# Patient Record
Sex: Female | Born: 1980 | Race: White | Hispanic: No | Marital: Single | Smoking: Current some day smoker
Health system: Southern US, Community
[De-identification: ages and names within clinical notes are randomized; demographics above are authoritative.]

## PROBLEM LIST (undated history)

## (undated) DIAGNOSIS — F32A Depression, unspecified: Secondary | ICD-10-CM

## (undated) DIAGNOSIS — J40 Bronchitis, not specified as acute or chronic: Secondary | ICD-10-CM

## (undated) DIAGNOSIS — F419 Anxiety disorder, unspecified: Secondary | ICD-10-CM

## (undated) DIAGNOSIS — F99 Mental disorder, not otherwise specified: Secondary | ICD-10-CM

## (undated) DIAGNOSIS — F319 Bipolar disorder, unspecified: Secondary | ICD-10-CM

## (undated) DIAGNOSIS — K219 Gastro-esophageal reflux disease without esophagitis: Secondary | ICD-10-CM

## (undated) DIAGNOSIS — M419 Scoliosis, unspecified: Secondary | ICD-10-CM

## (undated) DIAGNOSIS — E785 Hyperlipidemia, unspecified: Secondary | ICD-10-CM

## (undated) DIAGNOSIS — J45909 Unspecified asthma, uncomplicated: Secondary | ICD-10-CM

## (undated) HISTORY — DX: Bipolar disorder, unspecified: F31.9

## (undated) HISTORY — DX: Anxiety disorder, unspecified: F41.9

## (undated) HISTORY — DX: Mental disorder, not otherwise specified: F99

## (undated) HISTORY — PX: FACIAL FRACTURE SURGERY: SHX1570

## (undated) HISTORY — PX: NO PAST SURGERIES: SHX2092

## (undated) HISTORY — DX: Hyperlipidemia, unspecified: E78.5

## (undated) HISTORY — DX: Depression, unspecified: F32.A

---
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
## 1999-03-29 ENCOUNTER — Emergency Department (HOSPITAL_COMMUNITY): Admission: EM | Admit: 1999-03-29 | Discharge: 1999-03-29 | Payer: Self-pay | Admitting: Emergency Medicine

## 1999-03-29 ENCOUNTER — Encounter: Payer: Self-pay | Admitting: Emergency Medicine

## 2008-05-29 ENCOUNTER — Inpatient Hospital Stay (HOSPITAL_COMMUNITY): Admission: AD | Admit: 2008-05-29 | Discharge: 2008-05-29 | Payer: Self-pay | Admitting: Obstetrics

## 2008-06-29 ENCOUNTER — Ambulatory Visit (HOSPITAL_COMMUNITY): Admission: RE | Admit: 2008-06-29 | Discharge: 2008-06-29 | Payer: Self-pay | Admitting: Obstetrics

## 2008-08-23 ENCOUNTER — Inpatient Hospital Stay (HOSPITAL_COMMUNITY): Admission: AD | Admit: 2008-08-23 | Discharge: 2008-08-23 | Payer: Self-pay | Admitting: Obstetrics

## 2008-08-24 ENCOUNTER — Inpatient Hospital Stay (HOSPITAL_COMMUNITY): Admission: AD | Admit: 2008-08-24 | Discharge: 2008-08-24 | Payer: Self-pay | Admitting: Obstetrics

## 2008-08-24 ENCOUNTER — Inpatient Hospital Stay (HOSPITAL_COMMUNITY): Admission: AD | Admit: 2008-08-24 | Discharge: 2008-08-26 | Payer: Self-pay | Admitting: Obstetrics & Gynecology

## 2008-11-08 ENCOUNTER — Emergency Department (HOSPITAL_COMMUNITY): Admission: EM | Admit: 2008-11-08 | Discharge: 2008-11-08 | Payer: Self-pay | Admitting: Emergency Medicine

## 2009-12-10 ENCOUNTER — Ambulatory Visit (HOSPITAL_COMMUNITY): Admission: RE | Admit: 2009-12-10 | Discharge: 2009-12-10 | Payer: Self-pay | Admitting: Internal Medicine

## 2010-07-19 IMAGING — CR DG ABDOMEN ACUTE W/ 1V CHEST
3 series · 3 of 3 positions shown · non-contrast
Comparison: None

CLINICAL DATA: Nausea, vomiting, diarrhea, fever and cramping.

ACUTE ABDOMEN SERIES (ABDOMEN 2 VIEW & CHEST 1 VIEW)

[w chest pa]
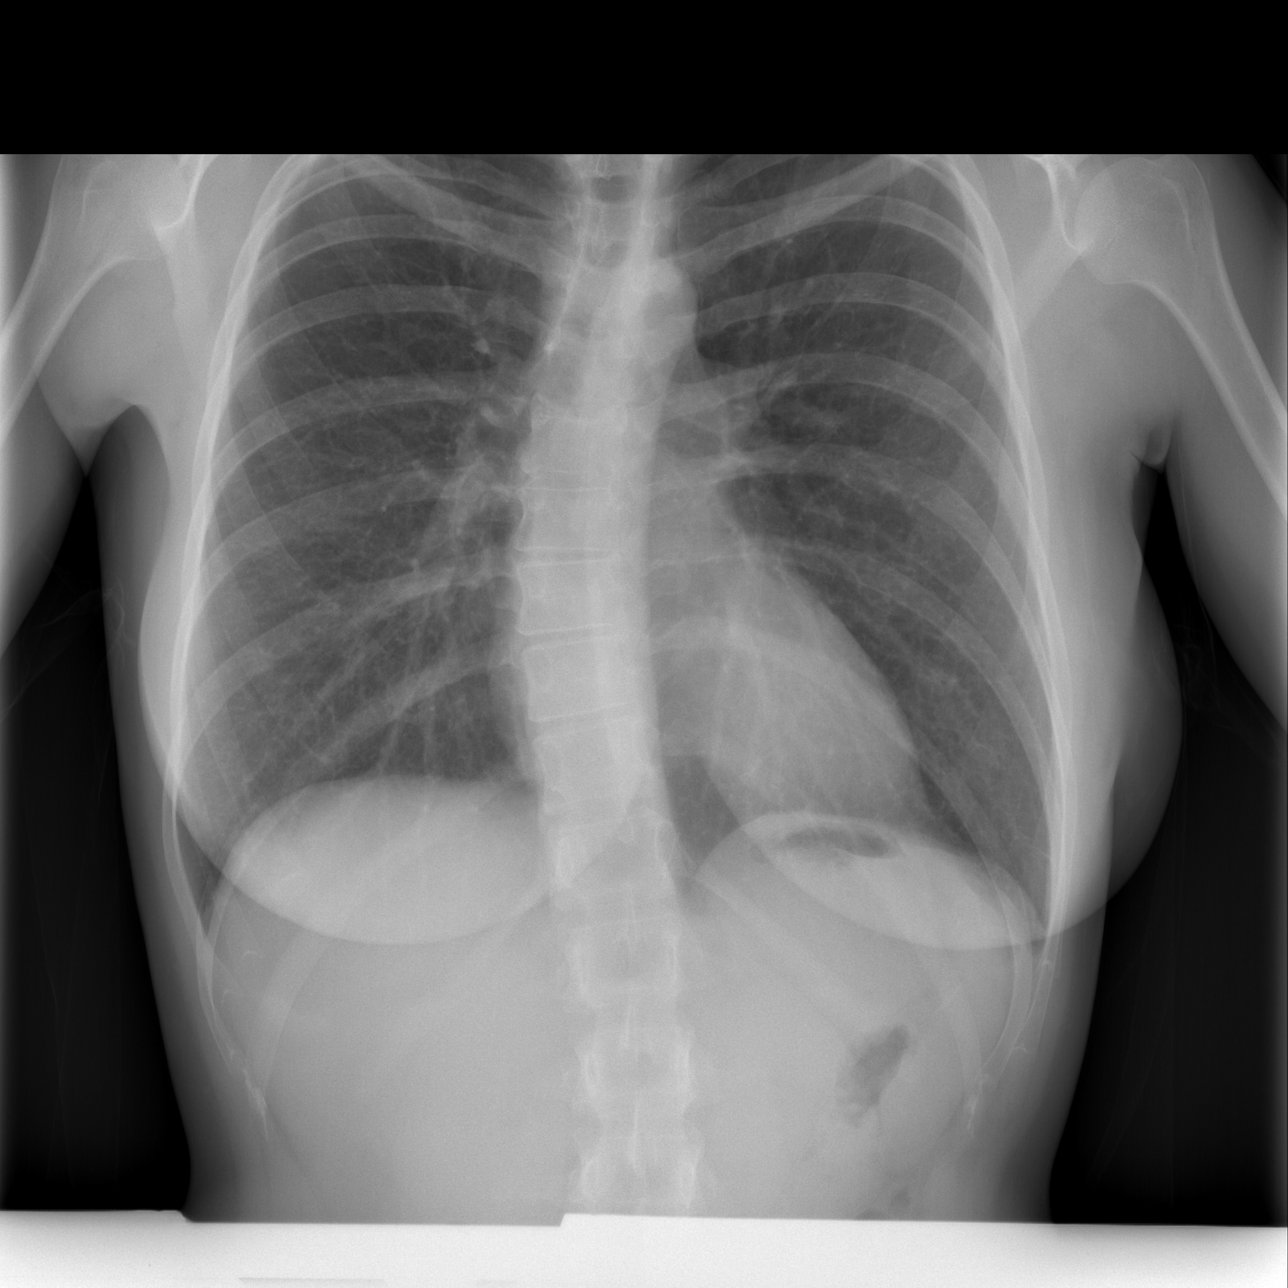

[w abdomen upright *]
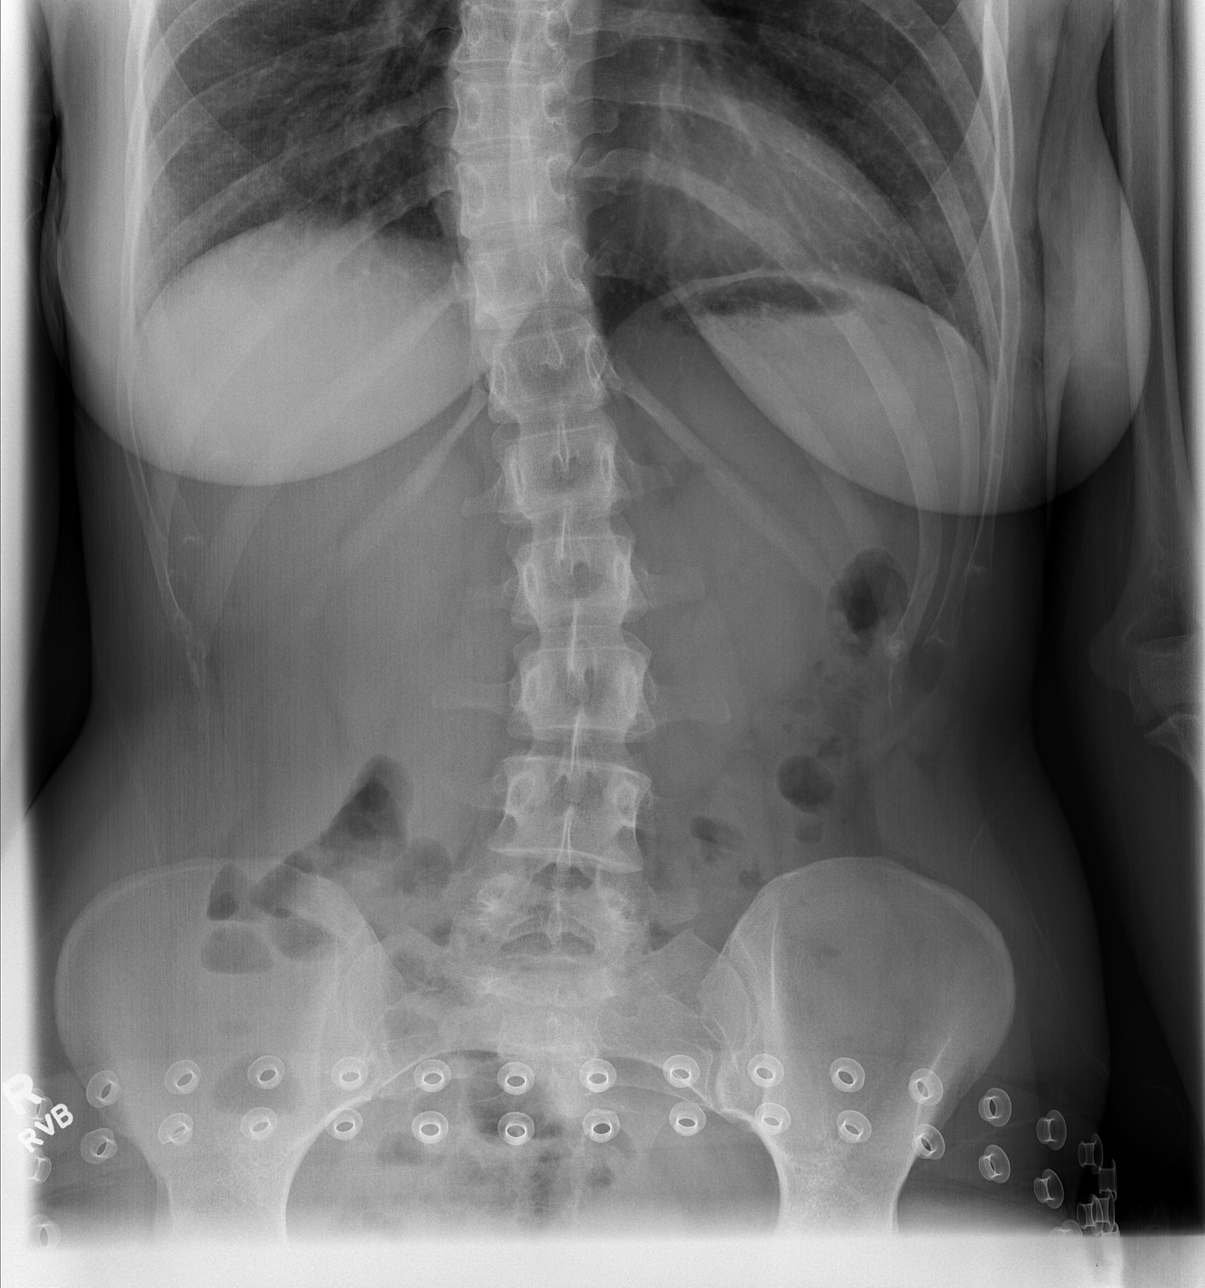

[t abdomen supine]
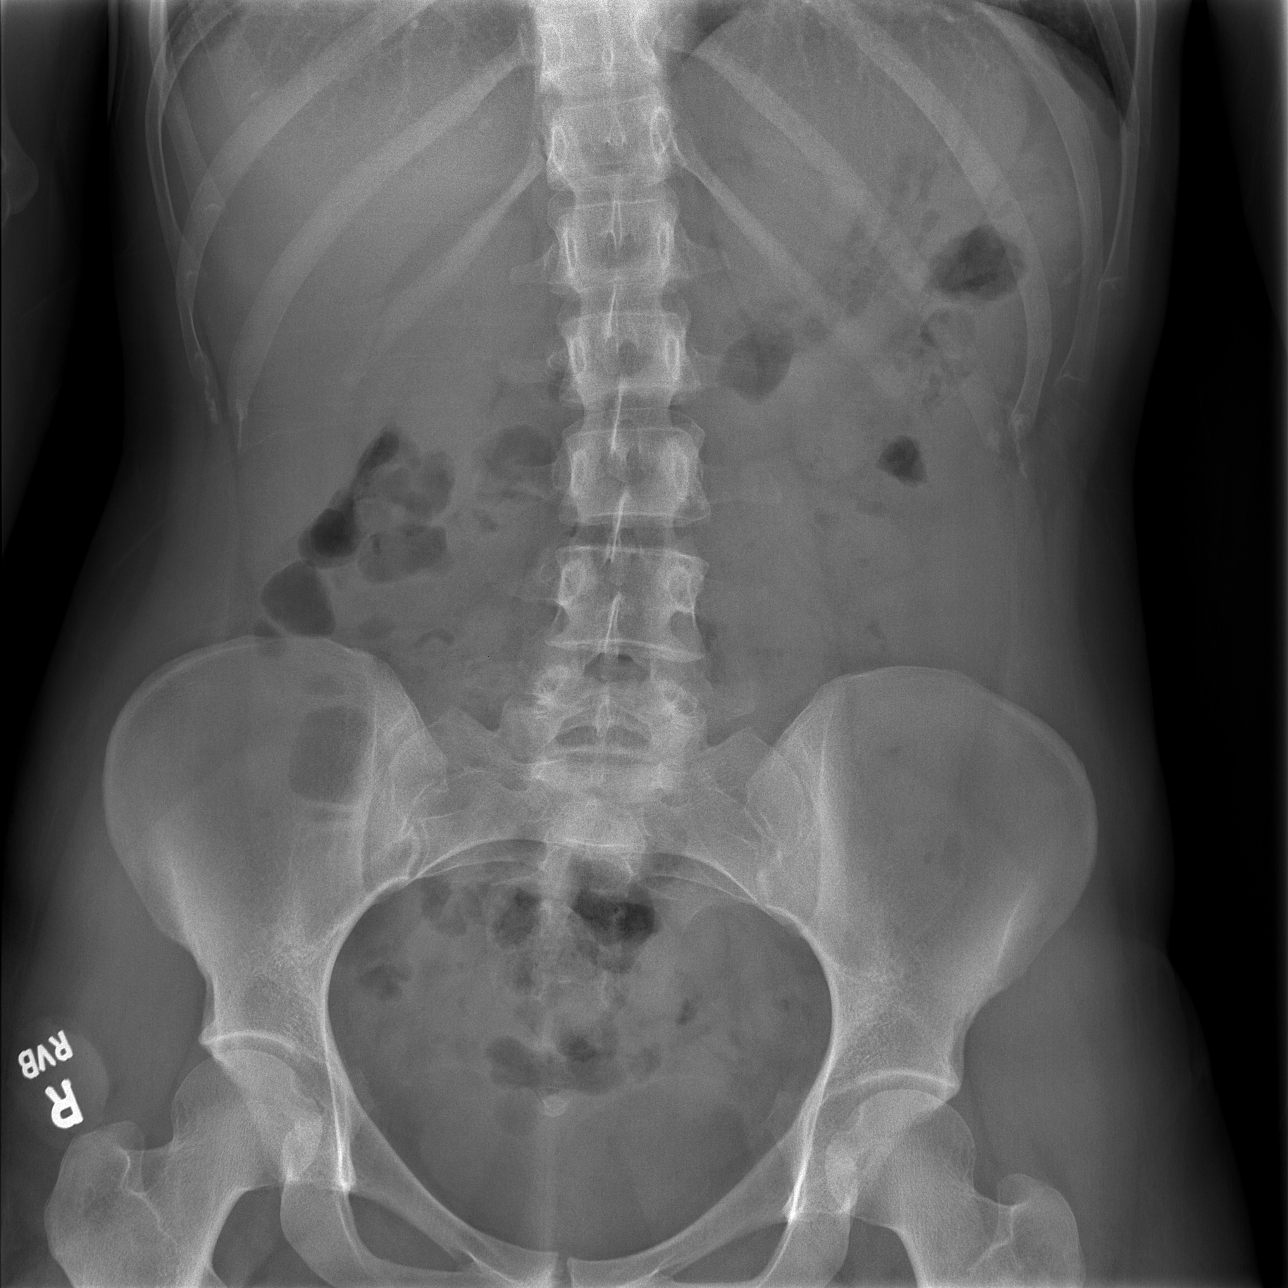

[3 of 3 positions shown; findings below may reference images not displayed]

FINDINGS: Frontal examination of the chest demonstrates a
thoracolumbar scoliosis.  The heart size and mediastinal contours
are normal.  The lungs are clear.  There is no pleural effusion or
pneumothorax.

Supine and erect views of the abdomen demonstrate a normal bowel
gas pattern.  There is no free intraperitoneal air or suspicious
abdominal calcification.  No acute osseous findings are
demonstrated.
IMPRESSION: No active cardiopulmonary or abdominal process.  Thoracolumbar
scoliosis.

## 2010-07-31 LAB — DIFFERENTIAL
Eosinophils Absolute: 0 10*3/uL (ref 0.0–0.7)
Eosinophils Relative: 0 % (ref 0–5)
Lymphs Abs: 1.3 10*3/uL (ref 0.7–4.0)
Monocytes Absolute: 0.4 10*3/uL (ref 0.1–1.0)

## 2010-07-31 LAB — URINALYSIS, ROUTINE W REFLEX MICROSCOPIC
Glucose, UA: NEGATIVE mg/dL
Hgb urine dipstick: NEGATIVE
Specific Gravity, Urine: >1.046 — ABNORMAL HIGH (ref 1.005–1.030)
pH: 6 (ref 5.0–8.0)

## 2010-07-31 LAB — COMPREHENSIVE METABOLIC PANEL
ALT: 22 U/L (ref 0–35)
AST: 35 U/L (ref 0–37)
CO2: 25 mEq/L (ref 19–32)
Calcium: 9.2 mg/dL (ref 8.4–10.5)
Chloride: 105 mEq/L (ref 96–112)
GFR calc Af Amer: >60 mL/min (ref >60)
GFR calc non Af Amer: >60 mL/min (ref >60)
Sodium: 138 mEq/L (ref 135–145)

## 2010-07-31 LAB — URINE MICROSCOPIC-ADD ON

## 2010-07-31 LAB — CBC
MCHC: 34.6 g/dL (ref 30.0–36.0)
RBC: 4.99 MIL/uL (ref 3.87–5.11)
WBC: 5.3 10*3/uL (ref 4.0–10.5)

## 2010-07-31 LAB — LIPASE, BLOOD: Lipase: 29 U/L (ref 11–59)

## 2010-08-02 LAB — CBC
HCT: 39 % (ref 36.0–46.0)
HCT: 41.9 % (ref 36.0–46.0)
MCHC: 35.3 g/dL (ref 30.0–36.0)
MCHC: 35.3 g/dL (ref 30.0–36.0)
MCV: 91 fL (ref 78.0–100.0)
MCV: 93.4 fL (ref 78.0–100.0)
Platelets: 161 10*3/uL (ref 150–400)
Platelets: 178 10*3/uL (ref 150–400)
RDW: 12.9 % (ref 11.5–15.5)
RDW: 12.9 % (ref 11.5–15.5)

## 2010-08-09 LAB — ABO/RH: ABO/RH(D): O POS

## 2010-09-06 NOTE — H&P (Signed)
NAME:  Katie Pope, Katie Pope            ACCOUNT NO.:  [id number]   MEDICAL RECORD NO.:  [id number]          PATIENT TYPE:  INP   LOCATION:  9105                          FACILITY:  [facility]   PHYSICIAN:  Roseanna Rainbow, M.D.DATE OF BIRTH:  November 26, 1980   DATE OF ADMISSION:  08/24/2008  DATE OF DISCHARGE:                              HISTORY & PHYSICAL   CHIEF COMPLAINT:  The patient is a 30 year old, para 0, with an  estimated date of confinement of Sep 02, 2008, with an intrauterine  pregnancy at 38 plus weeks complaining of rupture of membranes and  uterine contractions.   HISTORY OF PRESENT ILLNESS:  Please see the above.   ALLERGIES:  CEPHALOSPORINS.   MEDICATIONS:  Please see the medication reconciliation form.   PRENATAL LABS:  Urine culture and sensitivity, no growth.  GC probe  negative.  1-hour GCT 123, GBS negative on July 28, 2008.  Hemoglobin  11.9, hematocrit 35, HIV nonreactive, platelets 190,000.  Blood type is  O+, antibody screen negative, RPR nonreactive.  An ultrasound on February 07, 2008, 9 weeks 5-day a viable pregnancy.  An ultrasound on June 29, 2008, at 30 weeks 0 days at the 50th percentile, no previa, normal  amniotic fluid index.   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  The Pap smear was negative.  Tobacco use.   PAST GYN HISTORY:  Please see the above.   PAST MEDICAL HISTORY:  Asthma and kidney stones.   PAST SURGICAL HISTORY:  Oral surgery.   SOCIAL HISTORY:  She is unemployed.  Does not give any significant  history of alcohol usage.  Current tobacco use, has smoked for 5-10  years.   ILLICIT DRUG USE:  Please see the above.   FAMILY HISTORY:  Remarkable for adult-onset diabetes.   PHYSICAL EXAMINATION:  VITAL SIGNS:  Stable and afebrile.  Fetal heart  tracing reassuring.  Tocodynamometer, uterine contractions every 2  minutes.  GENERAL:  Uncomfortable.  ABDOMEN:  Gravid.  Sterile  vaginal exam per the RN.  There is a bloody  show admixed with amniotic fluid.  The cervix is 5 cm dilated.   ASSESSMENT:  Primigravida at term, late latent versus early active  labor.  Fetal heart tracing consistent with fetal well being.   PLAN:  Admission, expectant management.  Anticipate a spontaneous  vaginal delivery.      Roseanna Rainbow, M.D.  Electronically Signed     LAJ/MEDQ  D:  08/24/2008  T:  08/25/2008  Job:  [id number]

## 2011-07-27 ENCOUNTER — Encounter (HOSPITAL_COMMUNITY): Payer: Self-pay

## 2011-07-27 ENCOUNTER — Emergency Department (INDEPENDENT_AMBULATORY_CARE_PROVIDER_SITE_OTHER)
Admission: EM | Admit: 2011-07-27 | Discharge: 2011-07-27 | Disposition: A | Discharge status: Home / Self Care | Payer: Self-pay | Source: Home / Self Care | Attending: Family Medicine | Admitting: Family Medicine

## 2011-07-27 DIAGNOSIS — N390 Urinary tract infection, site not specified: Secondary | ICD-10-CM

## 2011-07-27 LAB — POCT URINALYSIS DIP (DEVICE)
Nitrite: NEGATIVE
Protein, ur: NEGATIVE mg/dL
pH: 6.5 (ref 5.0–8.0)

## 2011-07-27 MED ORDER — PHENAZOPYRIDINE HCL 200 MG PO TABS: 200.0000 mg | ORAL_TABLET | Freq: Three times a day (TID) | ORAL | Status: AC

## 2011-07-27 MED ORDER — CIPROFLOXACIN HCL 500 MG PO TABS: 500.0000 mg | ORAL_TABLET | Freq: Two times a day (BID) | ORAL | Status: AC

## 2011-07-27 NOTE — ED Notes (Signed)
Pt c/o dysuria and increased frequency onset Sunday.  Pt states she has HX of same feels like UTI to her.

## 2011-07-27 NOTE — ED Provider Notes (Addendum)
History     CSN: [id number]  Arrival date & time 07/27/11  1036   First MD Initiated Contact with Patient 07/27/11 1140      Chief Complaint  Patient presents with  . Dysuria    (Consider location/radiation/quality/duration/timing/severity/associated sxs/prior treatment) HPI Comments: Onset Sunday with dysuria and frequency. No fever, has had some chills. She reports some low back discomfort. No n/v. Has increased her water intake and drinking cranberry juice. Has a hx of uti's  The history is provided by the patient.    History reviewed. No pertinent past medical history.  History reviewed. No pertinent past surgical history.  History reviewed. No pertinent family history.  History  Substance Use Topics  . Smoking status: Current Everyday Smoker  . Smokeless tobacco: Not on file  . Alcohol Use: Yes    OB History    Grav Para Term Preterm Abortions TAB SAB Ect Mult Living                  Review of Systems  Constitutional: Positive for chills. Negative for fever.  HENT: Negative.   Respiratory: Negative.   Gastrointestinal: Negative.   Genitourinary: Positive for dysuria, urgency and frequency. Negative for flank pain.  Musculoskeletal: Negative.   Skin: Negative.     Allergies  Rocephin  Home Medications   Current Outpatient Rx  Name Route Sig Dispense Refill  . CIPROFLOXACIN HCL 500 MG PO TABS Oral Take 1 tablet (500 mg total) by mouth 2 (two) times daily. 28 tablet 0  . PHENAZOPYRIDINE HCL 200 MG PO TABS Oral Take 1 tablet (200 mg total) by mouth 3 (three) times daily. 6 tablet 0    BP 116/84  Pulse 68  Temp(Src) 98.4 F (36.9 C) (Oral)  Resp 16  SpO2 100%  LMP 07/15/2011  Physical Exam  Nursing note and vitals reviewed. Constitutional: She appears well-developed and well-nourished. No distress.  Cardiovascular: Normal rate.   Pulmonary/Chest: Effort normal.  Abdominal: Soft. Bowel sounds are normal.  Musculoskeletal:       No flank  tenderness  Skin: Skin is warm and dry.    ED Course  Procedures (including critical care time)  Labs Reviewed  POCT URINALYSIS DIP (DEVICE) - Abnormal; Notable for the following:    Hgb urine dipstick TRACE (*)    Leukocytes, UA TRACE (*) Biochemical Testing Only. Please order routine urinalysis from main lab if confirmatory testing is needed.   All other components within normal limits  POCT PREGNANCY, URINE  URINE CULTURE   No results found.   1. UTI (lower urinary tract infection)       MDM          Randa Spike, MD 07/27/11 [id number]  Randa Spike, MD 07/27/11 1239

## 2011-07-27 NOTE — Discharge Instructions (Signed)
Avoid caffeine and milk products. Follow up with your pcp or return if symptoms persist or worsen.

## 2011-07-30 LAB — URINE CULTURE

## 2011-08-01 NOTE — ED Notes (Signed)
Urine culture: >100,000 colonies Proteus Mirabilis. Pt. adequately treated with Cipro. Katie Pope 08/01/2011

## 2012-04-23 ENCOUNTER — Emergency Department (HOSPITAL_COMMUNITY): Payer: Self-pay

## 2012-04-23 ENCOUNTER — Encounter (HOSPITAL_COMMUNITY): Payer: Self-pay | Admitting: *Deleted

## 2012-04-23 ENCOUNTER — Emergency Department (HOSPITAL_COMMUNITY)
Admission: EM | Admit: 2012-04-23 | Discharge: 2012-04-23 | Disposition: A | Discharge status: Home / Self Care | Payer: Self-pay | Attending: Emergency Medicine | Admitting: Emergency Medicine

## 2012-04-23 DIAGNOSIS — Z8709 Personal history of other diseases of the respiratory system: Secondary | ICD-10-CM | POA: Insufficient documentation

## 2012-04-23 DIAGNOSIS — R1110 Vomiting, unspecified: Secondary | ICD-10-CM | POA: Insufficient documentation

## 2012-04-23 DIAGNOSIS — Z3202 Encounter for pregnancy test, result negative: Secondary | ICD-10-CM | POA: Insufficient documentation

## 2012-04-23 DIAGNOSIS — J069 Acute upper respiratory infection, unspecified: Secondary | ICD-10-CM | POA: Insufficient documentation

## 2012-04-23 DIAGNOSIS — M549 Dorsalgia, unspecified: Secondary | ICD-10-CM | POA: Insufficient documentation

## 2012-04-23 DIAGNOSIS — J45909 Unspecified asthma, uncomplicated: Secondary | ICD-10-CM | POA: Insufficient documentation

## 2012-04-23 DIAGNOSIS — R490 Dysphonia: Secondary | ICD-10-CM | POA: Insufficient documentation

## 2012-04-23 DIAGNOSIS — J029 Acute pharyngitis, unspecified: Secondary | ICD-10-CM | POA: Insufficient documentation

## 2012-04-23 DIAGNOSIS — R5381 Other malaise: Secondary | ICD-10-CM | POA: Insufficient documentation

## 2012-04-23 DIAGNOSIS — R197 Diarrhea, unspecified: Secondary | ICD-10-CM | POA: Insufficient documentation

## 2012-04-23 DIAGNOSIS — M4120 Other idiopathic scoliosis, site unspecified: Secondary | ICD-10-CM | POA: Insufficient documentation

## 2012-04-23 DIAGNOSIS — R079 Chest pain, unspecified: Secondary | ICD-10-CM | POA: Insufficient documentation

## 2012-04-23 DIAGNOSIS — J3489 Other specified disorders of nose and nasal sinuses: Secondary | ICD-10-CM | POA: Insufficient documentation

## 2012-04-23 DIAGNOSIS — Z87891 Personal history of nicotine dependence: Secondary | ICD-10-CM | POA: Insufficient documentation

## 2012-04-23 HISTORY — DX: Bronchitis, not specified as acute or chronic: J40

## 2012-04-23 HISTORY — DX: Unspecified asthma, uncomplicated: J45.909

## 2012-04-23 HISTORY — DX: Scoliosis, unspecified: M41.9

## 2012-04-23 LAB — COMPREHENSIVE METABOLIC PANEL
Albumin: 4.3 g/dL (ref 3.5–5.2)
Alkaline Phosphatase: 55 U/L (ref 39–117)
BUN: 13 mg/dL (ref 6–23)
CO2: 27 mEq/L (ref 19–32)
Chloride: 102 mEq/L (ref 96–112)
GFR calc Af Amer: >90 mL/min (ref >90)
GFR calc non Af Amer: >90 mL/min (ref >90)
Glucose, Bld: 106 mg/dL — ABNORMAL HIGH (ref 70–99)
Potassium: 3.6 mEq/L (ref 3.5–5.1)
Total Bilirubin: 0.4 mg/dL (ref 0.3–1.2)

## 2012-04-23 LAB — URINALYSIS, ROUTINE W REFLEX MICROSCOPIC
Bilirubin Urine: NEGATIVE
Ketones, ur: NEGATIVE mg/dL
Nitrite: NEGATIVE
Protein, ur: NEGATIVE mg/dL
pH: 7.5 (ref 5.0–8.0)

## 2012-04-23 LAB — CBC WITH DIFFERENTIAL/PLATELET
HCT: 44 % (ref 36.0–46.0)
Hemoglobin: 15.4 g/dL — ABNORMAL HIGH (ref 12.0–15.0)
Lymphocytes Relative: 20 % (ref 12–46)
Lymphs Abs: 1.9 10*3/uL (ref 0.7–4.0)
Monocytes Absolute: 0.5 10*3/uL (ref 0.1–1.0)
Monocytes Relative: 6 % (ref 3–12)
Neutro Abs: 6.7 10*3/uL (ref 1.7–7.7)
Neutrophils Relative %: 73 % (ref 43–77)
RBC: 4.79 MIL/uL (ref 3.87–5.11)

## 2012-04-23 LAB — LIPASE, BLOOD: Lipase: 29 U/L (ref 11–59)

## 2012-04-23 MED ORDER — HYDROCODONE-ACETAMINOPHEN 7.5-325 MG/15ML PO SOLN: 15.0000 mL | Freq: Three times a day (TID) | ORAL | Status: DC | PRN

## 2012-04-23 NOTE — ED Notes (Signed)
Pt states 3 day history of sorethroat, dry cough, mid right back pain, vomiting and diarrhea.  Pt denies sob.  Pt sounds hoarse

## 2012-04-23 NOTE — ED Provider Notes (Signed)
History     CSN: [id number]  Arrival date & time 04/23/12  0920   First MD Initiated Contact with Patient 04/23/12 [phone]      Chief Complaint  Patient presents with  . Back Pain  . Cough  . Diarrhea    (Consider location/radiation/quality/duration/timing/severity/associated sxs/prior treatment) Patient is a 31 y.o. female presenting with cough, diarrhea, and flu symptoms. The history is provided by the patient.  Cough Associated symptoms include sore throat. Pertinent negatives include no myalgias.  Diarrhea The primary symptoms include fatigue. Primary symptoms do not include fever, abdominal pain, nausea, myalgias or rash.  The illness is also significant for back pain.  Influenza This is a new problem. The current episode started in the past 7 days. Associated symptoms include congestion, coughing, fatigue and a sore throat. Pertinent negatives include no abdominal pain, fever, myalgias, nausea, neck pain or rash. Associated symptoms comments: Sore throat, nasal congestion for 2-3 days, now with cough that causes a burning type chest pain today. No fever. .    Past Medical History  Diagnosis Date  . Bronchitis   . Asthma   . Scoliosis     History reviewed. No pertinent past surgical history.  No family history on file.  History  Substance Use Topics  . Smoking status: Former Smoker    Quit date: 12/23/2011  . Smokeless tobacco: Not on file  . Alcohol Use: Yes     Comment: occ    OB History    Grav Para Term Preterm Abortions TAB SAB Ect Mult Living                  Review of Systems  Constitutional: Positive for fatigue. Negative for fever.  HENT: Positive for congestion and sore throat. Negative for neck pain.   Respiratory: Positive for cough.   Gastrointestinal: Negative for nausea and abdominal pain.  Musculoskeletal: Positive for back pain. Negative for myalgias.  Skin: Negative for rash.    Allergies  Rocephin  Home Medications  No current  outpatient prescriptions on file.  BP 129/82  Pulse 91  Temp 98.4 F (36.9 C) (Oral)  Resp 18  SpO2 99%  LMP 03/30/2012  Physical Exam  Constitutional: She is oriented to person, place, and time. She appears well-developed and well-nourished.  HENT:  Head: Normocephalic.  Nose: Mucosal edema present.  Mouth/Throat: Mucous membranes are normal. Posterior oropharyngeal erythema present. No posterior oropharyngeal edema.  Neck: Normal range of motion. Neck supple.  Cardiovascular: Normal rate and regular rhythm.   Pulmonary/Chest: Effort normal and breath sounds normal.  Abdominal: Soft. Bowel sounds are normal. There is no tenderness. There is no rebound and no guarding.  Musculoskeletal: Normal range of motion.  Neurological: She is alert and oriented to person, place, and time.  Skin: Skin is warm and dry. No rash noted.  Psychiatric: She has a normal mood and affect.    ED Course  Procedures (including critical care time)  Labs Reviewed  CBC WITH DIFFERENTIAL - Abnormal; Notable for the following:    Hemoglobin 15.4 (*)     All other components within normal limits  COMPREHENSIVE METABOLIC PANEL - Abnormal; Notable for the following:    Glucose, Bld 106 (*)     AST 43 (*)     ALT 41 (*)     All other components within normal limits  LIPASE, BLOOD  URINALYSIS, ROUTINE W REFLEX MICROSCOPIC  POCT PREGNANCY, URINE   Dg Chest 2 View  04/23/2012  *RADIOLOGY  REPORT*  Clinical Data: Cough and back pain.  CHEST - 2 VIEW  Comparison: None  Findings: The cardiac silhouette, mediastinal and hilar contours are normal.  The lungs are clear.  No pleural effusion.  The bony thorax is intact.  Moderate right convex thoracic scoliosis.  IMPRESSION: No acute cardiopulmonary findings.   Original Report Authenticated By: Rudie Meyer, M.D.      No diagnosis found.  1. Viral URI  MDM  Findings support viral process. DDx viral URI vs influenza, favor viral URI given no muscular aches,  and pharyngitis as a main complaint. Will treat supportively.        Arnoldo Hooker, PA-C 04/23/12 1133

## 2012-04-24 NOTE — ED Provider Notes (Signed)
Medical screening examination/treatment/procedure(s) were performed by non-physician practitioner and as supervising physician I was immediately available for consultation/collaboration.  Sunnie Nielsen, MD 04/24/12 [phone]

## 2013-06-26 ENCOUNTER — Encounter (HOSPITAL_COMMUNITY): Payer: Self-pay | Admitting: Emergency Medicine

## 2013-06-26 ENCOUNTER — Emergency Department (INDEPENDENT_AMBULATORY_CARE_PROVIDER_SITE_OTHER)
Admission: EM | Admit: 2013-06-26 | Discharge: 2013-06-26 | Disposition: A | Discharge status: Home / Self Care | Payer: Self-pay | Source: Home / Self Care | Attending: Family Medicine | Admitting: Family Medicine

## 2013-06-26 DIAGNOSIS — L259 Unspecified contact dermatitis, unspecified cause: Secondary | ICD-10-CM

## 2013-06-26 DIAGNOSIS — L309 Dermatitis, unspecified: Secondary | ICD-10-CM

## 2013-06-26 MED ORDER — TRIAMCINOLONE ACETONIDE 0.5 % EX OINT: 1.0000 "application " | TOPICAL_OINTMENT | Freq: Two times a day (BID) | CUTANEOUS | Status: DC

## 2013-06-26 NOTE — ED Provider Notes (Signed)
Katie Pope is a 33 y.o. female who presents to Urgent Care today for rash. Patient notes one day of worsening rash across her left trunk. She has seasonal allergies that are exacerbated by her occupation. She works at a [employer]. Since she's been working there she notes runny nose coughing congestion. She uses Benadryl which helps typically. One day ago she developed a itchy rash across her left neck and trunk. She has been using hydrocortisone cream which helps only a little. No trouble breathing nausea vomiting or diarrhea.   Past Medical History  Diagnosis Date  . Bronchitis   . Asthma   . Scoliosis    History  Substance Use Topics  . Smoking status: Former Smoker    Quit date: 12/23/2011  . Smokeless tobacco: Not on file  . Alcohol Use: Yes     Comment: occ   ROS as above Medications: No current facility-administered medications for this encounter.   Current Outpatient Prescriptions  Medication Sig Dispense Refill  . triamcinolone ointment (KENALOG) 0.5 % Apply 1 application topically 2 (two) times daily.  60 g  1    Exam:  BP 140/96  Pulse 77  Temp(Src) 98.3 F (36.8 C) (Oral)  Resp 20  SpO2 100%  LMP 06/20/2013 Gen: Well NAD HEENT: EOMI,  MMM Lungs: Normal work of breathing. CTABL Heart: RRR no MRG Abd: NABS, Soft. NT, ND Exts: Brisk capillary refill, warm and well perfused.  Skin: Maculopapular blanching rash left neck and trunk.   Assessment and Plan: 33 y.o. female with seasonal allergies and atopic dermatitis. Plan to treat with triamcinolone cream and cetirizine. Followup with primary care provider as needed.  Discussed warning signs or symptoms. Please see discharge instructions. Patient expresses understanding.    Rodolph BongEvan S Quetzalli Clos, MD 06/26/13 [phone]

## 2013-06-26 NOTE — ED Notes (Signed)
C/o  Rash around neck and chest.  Corners of eyes and around lips.  States "possible allergic reaction"   Denies any changes in soaps and detergents.  No new meds.   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  Also states that when it rains there is a leak in the roof at her job, unsure if its mold that causing the rash.   Symptoms present x couple of months.   Mild relief with benadryl.

## 2013-06-26 NOTE — Discharge Instructions (Signed)
Thank you for coming in today. Try taking Zyrtec instead of Benadryl Use triamcinolone cream as needed Call or go to the emergency room if you get worse, have trouble breathing, have chest pains, or palpitations.   Contact Dermatitis Contact dermatitis is a rash that happens when something touches the skin. You touched something that irritates your skin, or you have allergies to something you touched. HOME CARE   Avoid the thing that caused your rash.  Keep your rash away from hot water, soap, sunlight, chemicals, and other things that might bother it.  Do not scratch your rash.  You can take cool baths to help stop itching.  Only take medicine as told by your doctor.  Keep all doctor visits as told. GET HELP RIGHT AWAY IF:   Your rash is not better after 3 days.  Your rash gets worse.  Your rash is puffy (swollen), tender, red, sore, or warm.  You have problems with your medicine. MAKE SURE YOU:   Understand these instructions.  Will watch your condition.  Will get help right away if you are not doing well or get worse. Document Released: 02/05/2009 Document Revised: 07/03/2011 Document Reviewed: 09/13/2010 [facility] Patient Information 2014 [employer], [place].

## 2013-07-04 ENCOUNTER — Encounter: Payer: Self-pay | Admitting: Internal Medicine

## 2013-07-04 ENCOUNTER — Ambulatory Visit: Payer: Self-pay | Attending: Internal Medicine | Admitting: Internal Medicine

## 2013-07-04 VITALS — BP 128/89 | HR 72 | Temp 98.0°F | Resp 16 | Ht 65.5 in | Wt 140.0 lb

## 2013-07-04 DIAGNOSIS — H109 Unspecified conjunctivitis: Secondary | ICD-10-CM | POA: Insufficient documentation

## 2013-07-04 DIAGNOSIS — L259 Unspecified contact dermatitis, unspecified cause: Secondary | ICD-10-CM | POA: Insufficient documentation

## 2013-07-04 DIAGNOSIS — Z09 Encounter for follow-up examination after completed treatment for conditions other than malignant neoplasm: Secondary | ICD-10-CM | POA: Insufficient documentation

## 2013-07-04 DIAGNOSIS — Z87891 Personal history of nicotine dependence: Secondary | ICD-10-CM | POA: Insufficient documentation

## 2013-07-04 DIAGNOSIS — H01139 Eczematous dermatitis of unspecified eye, unspecified eyelid: Secondary | ICD-10-CM

## 2013-07-04 DIAGNOSIS — R05 Cough: Secondary | ICD-10-CM | POA: Insufficient documentation

## 2013-07-04 DIAGNOSIS — R059 Cough, unspecified: Secondary | ICD-10-CM | POA: Insufficient documentation

## 2013-07-04 DIAGNOSIS — J209 Acute bronchitis, unspecified: Secondary | ICD-10-CM | POA: Insufficient documentation

## 2013-07-04 LAB — LIPID PANEL
CHOL/HDL RATIO: 1.9 ratio
CHOLESTEROL: 187 mg/dL (ref 0–200)
HDL: 97 mg/dL (ref >39)
LDL CALC: 67 mg/dL (ref 0–99)
TRIGLYCERIDES: 116 mg/dL (ref <150)
VLDL: 23 mg/dL (ref 0–40)

## 2013-07-04 LAB — COMPLETE METABOLIC PANEL WITH GFR
ALBUMIN: 4.5 g/dL (ref 3.5–5.2)
ALK PHOS: 55 U/L (ref 39–117)
ALT: 20 U/L (ref 0–35)
AST: 25 U/L (ref 0–37)
BUN: 13 mg/dL (ref 6–23)
CALCIUM: 9.5 mg/dL (ref 8.4–10.5)
CHLORIDE: 104 meq/L (ref 96–112)
CO2: 26 mEq/L (ref 19–32)
Creat: 0.85 mg/dL (ref 0.50–1.10)
GFR, Est African American: >89 mL/min
GFR, Est Non African American: >89 mL/min
Glucose, Bld: 85 mg/dL (ref 70–99)
POTASSIUM: 3.5 meq/L (ref 3.5–5.3)
SODIUM: 136 meq/L (ref 135–145)
TOTAL PROTEIN: 7.4 g/dL (ref 6.0–8.3)
Total Bilirubin: 0.5 mg/dL (ref 0.2–1.2)

## 2013-07-04 LAB — CBC WITH DIFFERENTIAL/PLATELET
BASOS ABS: 0.1 10*3/uL (ref 0.0–0.1)
BASOS PCT: 1 % (ref 0–1)
Eosinophils Absolute: 0.2 10*3/uL (ref 0.0–0.7)
Eosinophils Relative: 2 % (ref 0–5)
HEMATOCRIT: 40.9 % (ref 36.0–46.0)
Hemoglobin: 14.2 g/dL (ref 12.0–15.0)
LYMPHS PCT: 35 % (ref 12–46)
Lymphs Abs: 2.9 10*3/uL (ref 0.7–4.0)
MCH: 30.7 pg (ref 26.0–34.0)
MCHC: 34.7 g/dL (ref 30.0–36.0)
MCV: 88.5 fL (ref 78.0–100.0)
MONO ABS: 0.6 10*3/uL (ref 0.1–1.0)
Monocytes Relative: 7 % (ref 3–12)
NEUTROS ABS: 4.5 10*3/uL (ref 1.7–7.7)
NEUTROS PCT: 55 % (ref 43–77)
Platelets: 242 10*3/uL (ref 150–400)
RBC: 4.62 MIL/uL (ref 3.87–5.11)
RDW: 12.8 % (ref 11.5–15.5)
WBC: 8.2 10*3/uL (ref 4.0–10.5)

## 2013-07-04 MED ORDER — TETRAHYDROZOLINE HCL 0.05 % OP SOLN: 1.0000 [drp] | Freq: Two times a day (BID) | OPHTHALMIC | Status: DC

## 2013-07-04 MED ORDER — PREDNISONE 20 MG PO TABS
20.0000 mg | ORAL_TABLET | Freq: Every day | ORAL | Status: DC
Start: 2013-07-04 — End: 2016-05-11

## 2013-07-04 MED ORDER — AZITHROMYCIN 250 MG PO TABS: ORAL_TABLET | ORAL | Status: DC

## 2013-07-04 MED ORDER — HYDROCORTISONE 0.5 % EX CREA: 1.0000 "application " | TOPICAL_CREAM | Freq: Two times a day (BID) | CUTANEOUS | Status: DC

## 2013-07-04 NOTE — Progress Notes (Signed)
Pt is establish care. Pt has a rash on her back.

## 2013-07-04 NOTE — Progress Notes (Signed)
Patient ID: Katie SleightJennifer D Pope, female   DOB: 1981-03-22, 33 y.o.   MRN: [id number]   CC:  HPI: 33 year old female here for followup of her rash. The patient started noticing this rash 2-3 weeks ago. It is under her eyes, across her trunk, on her chest, on her forearm. She has been applying calamine lotion and taking Benadryl for the rash that seems to have helped. Along with her skin symptoms the patient also has a cough runny nose and productive sputum every morning. She works in [employer] and states that the patient has mold in [employer]  Family history history of diabetes in both parents, aunt had breast cancer  History nonsmoker nonalcoholic   Allergies  Allergen Reactions  . Rocephin [Ceftriaxone Sodium In Dextrose] Rash   Past Medical History  Diagnosis Date  . Bronchitis   . Asthma   . Scoliosis    No current outpatient prescriptions on file prior to visit.   No current facility-administered medications on file prior to visit.   No family history on file. History   Social History  . Marital Status: Divorced    Spouse Name: N/A    Number of Children: N/A  . Years of Education: N/A   Occupational History  . Not on file.   Social History Main Topics  . Smoking status: Former Smoker    Quit date: 12/23/2011  . Smokeless tobacco: Not on file  . Alcohol Use: Yes     Comment: occ  . Drug Use: No  . Sexual Activity: Yes    Birth Control/ Protection: Condom, IUD   Other Topics Concern  . Not on file   Social History Narrative  . No narrative on file    Review of Systems  Constitutional: Negative for fever, chills, diaphoresis, activity change, appetite change and fatigue.  HENT: Negative for ear pain, nosebleeds, congestion, facial swelling, rhinorrhea, neck pain, neck stiffness and ear discharge.   Eyes: Negative for pain, discharge, redness, itching and visual disturbance.  Respiratory: Negative for cough, choking, chest tightness, shortness of  breath, wheezing and stridor.   Cardiovascular: Negative for chest pain, palpitations and leg swelling.  Gastrointestinal: Negative for abdominal distention.  Genitourinary: Negative for dysuria, urgency, frequency, hematuria, flank pain, decreased urine volume, difficulty urinating and dyspareunia.  Musculoskeletal: Negative for back pain, joint swelling, arthralgias and gait problem.  Neurological: Negative for dizziness, tremors, seizures, syncope, facial asymmetry, speech difficulty, weakness, light-headedness, numbness and headaches.  Hematological: Negative for adenopathy. Does not bruise/bleed easily.  Psychiatric/Behavioral: Negative for hallucinations, behavioral problems, confusion, dysphoric mood, decreased concentration and agitation.    Objective:  There were no vitals filed for this visit.  Physical Exam  Constitutional: Appears well-developed and well-nourished. No distress.  HENT: Normocephalic. External right and left ear normal. Oropharynx is clear and moist.  Eyes: Conjunctivae and EOM are normal. PERRLA, no scleral icterus.  Neck: Normal ROM. Neck supple. No JVD. No tracheal deviation. No thyromegaly.  CVS: RRR, S1/S2 +, no murmurs, no gallops, no carotid bruit.  Pulmonary: Effort and breath sounds normal, no stridor, rhonchi, wheezes, rales.  Abdominal: Soft. BS +,  no distension, tenderness, rebound or guarding.  Musculoskeletal: Normal range of motion. No edema and no tenderness.  Lymphadenopathy: No lymphadenopathy noted, cervical, inguinal. Neuro: Alert. Normal reflexes, muscle tone coordination. No cranial nerve deficit. Skin: Macular rash on the trunk, neck, under the eyelids and forearm. Not diaphoretic. No erythema. No pallor.  Psychiatric: Normal mood and affect. Behavior, judgment, thought content  normal.   Lab Results  Component Value Date   WBC 9.1 04/23/2012   HGB 15.4* 04/23/2012   HCT 44.0 04/23/2012   MCV 91.9 04/23/2012   PLT 217 04/23/2012    Lab Results  Component Value Date   CREATININE 0.74 04/23/2012   BUN 13 04/23/2012   NA 139 04/23/2012   K 3.6 04/23/2012   CL 102 04/23/2012   CO2 27 04/23/2012    No results found for this basename: HGBA1C   Lipid Panel  No results found for this basename: chol, trig, hdl, cholhdl, vldl, ldlcalc       Assessment and plan:   There are no active problems to display for this patient.  Eczematous dermatitis We'll prescribe prednisone 20 mg for 7 days Patient advised to continue Benadryl 25 mg every 6 as needed Continue calamine lotion for itching Dermatology referral if rash persists  Allergic conjunctivitis We'll prescribe Visine eyedrops   Acute bronchitis Question secondary to molds We'll prescribe Z-Pak for 5 days   Establish care Patient had a Pap smear on 05/26/13 which was negative other than hospital Baseline labs If all negative the patient can follow up in 6-8 months       The patient was given clear instructions to go to ER or return to medical center if symptoms don't improve, worsen or new problems develop. The patient verbalized understanding. The patient was told to call to get any lab results if not heard anything in the next week.

## 2013-07-05 LAB — VITAMIN B12: Vitamin B-12: 312 pg/mL (ref 211–911)

## 2013-07-05 LAB — VITAMIN D 25 HYDROXY (VIT D DEFICIENCY, FRACTURES): Vit D, 25-Hydroxy: 16 ng/mL — ABNORMAL LOW (ref 30–89)

## 2013-07-05 LAB — TSH: TSH: 2.491 u[IU]/mL (ref 0.350–4.500)

## 2013-07-07 ENCOUNTER — Ambulatory Visit: Payer: Self-pay | Attending: Internal Medicine

## 2013-07-09 ENCOUNTER — Telehealth: Payer: Self-pay | Admitting: Emergency Medicine

## 2013-07-09 MED ORDER — VITAMIN D (ERGOCALCIFEROL) 1.25 MG (50000 UNIT) PO CAPS: 50000.0000 [IU] | ORAL_CAPSULE | ORAL | Status: DC

## 2013-07-09 NOTE — Telephone Encounter (Signed)
Pt given lab results with medication instructions 

## 2013-07-09 NOTE — Telephone Encounter (Signed)
Message copied by Darlis LoanSMITH, JILL D on Wed Jul 09, 2013  5:49 PM ------      Message from: Susie CassetteABROL MD, Germain OsgoodNAYANA      Created: Wed Jul 09, 2013 11:58 AM       Notify patient that vitamin D. level is low. Rest of the labs are normal. Prescribe vitamin D 50,000 units weekly, 12 tablets 0 refills ------

## 2013-09-25 ENCOUNTER — Telehealth: Payer: Self-pay | Admitting: Internal Medicine

## 2013-09-25 NOTE — Telephone Encounter (Signed)
Pt. was prescribed Vitamin D, she has completed the 3 month trial.....pt. Stated that she was informed to come back for labs when all medication was taken. Please call patient

## 2013-09-30 ENCOUNTER — Telehealth: Payer: Self-pay | Admitting: Emergency Medicine

## 2013-09-30 NOTE — Telephone Encounter (Signed)
Left voice message for pt to call when she receives message

## 2016-01-14 ENCOUNTER — Emergency Department (HOSPITAL_COMMUNITY)
Admission: EM | Admit: 2016-01-14 | Discharge: 2016-01-14 | Disposition: A | Discharge status: Home / Self Care | Payer: Self-pay | Attending: Emergency Medicine | Admitting: Emergency Medicine

## 2016-01-14 ENCOUNTER — Encounter (HOSPITAL_COMMUNITY): Payer: Self-pay | Admitting: Emergency Medicine

## 2016-01-14 ENCOUNTER — Emergency Department (HOSPITAL_COMMUNITY): Payer: Self-pay

## 2016-01-14 DIAGNOSIS — Y9289 Other specified places as the place of occurrence of the external cause: Secondary | ICD-10-CM | POA: Insufficient documentation

## 2016-01-14 DIAGNOSIS — Y9389 Activity, other specified: Secondary | ICD-10-CM | POA: Insufficient documentation

## 2016-01-14 DIAGNOSIS — W1839XA Other fall on same level, initial encounter: Secondary | ICD-10-CM | POA: Insufficient documentation

## 2016-01-14 DIAGNOSIS — Z87891 Personal history of nicotine dependence: Secondary | ICD-10-CM | POA: Insufficient documentation

## 2016-01-14 DIAGNOSIS — J45909 Unspecified asthma, uncomplicated: Secondary | ICD-10-CM | POA: Insufficient documentation

## 2016-01-14 DIAGNOSIS — Y999 Unspecified external cause status: Secondary | ICD-10-CM | POA: Insufficient documentation

## 2016-01-14 DIAGNOSIS — S20211A Contusion of right front wall of thorax, initial encounter: Secondary | ICD-10-CM | POA: Insufficient documentation

## 2016-01-14 MED ORDER — HYDROCODONE-ACETAMINOPHEN 5-325 MG PO TABS: 1.0000 | ORAL_TABLET | ORAL | 0 refills | Status: DC | PRN

## 2016-01-14 MED ORDER — IBUPROFEN 600 MG PO TABS: 600.0000 mg | ORAL_TABLET | Freq: Four times a day (QID) | ORAL | 0 refills | Status: DC | PRN

## 2016-01-14 MED ORDER — IBUPROFEN 400 MG PO TABS
600.0000 mg | ORAL_TABLET | Freq: Once | ORAL | Status: AC
  Administered 2016-01-14: 600 mg via ORAL
  Filled 2016-01-14: qty 1

## 2016-01-14 NOTE — ED Triage Notes (Signed)
Pt sts trip and fall today with right rib pain

## 2016-01-14 NOTE — ED Provider Notes (Signed)
[facility]-EMER[facility]Y DEPT Provider Note   CSN: [id number] Arrival date & time: 01/14/16  4098  By signing my name below, I, Linna Darner, attest that this documentation has been prepared under the direction and in the presence of Melburn Hake, [place]. Electronically Signed: Linna Darner, Scribe. 01/14/2016. 9:25 AM.  History   Chief Complaint Chief Complaint  Patient presents with  . Fall    The history is provided by the patient. No language interpreter was used.     HPI Comments: Katie Pope is a 35 y.o. female who presents to the Emergency Department complaining of sudden onset, constant, right rib pain s/p fall occurring around 3 AM this morning. 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 Pt denies hitting or head or losing consciousness. She notes some associated middle chest pain/soreness as well. Pt endorses rib pain exacerbation with deep inspiration, general movement, and right arm movement. Pt has not tried any medications PTA. She denies back pain, abdominal pain, nausea, vomiting, numbness/tingling, weakness, or any other associated symptoms.  Past Medical History:  Diagnosis Date  . Asthma   . Bronchitis   . Scoliosis     There are no active problems to display for this patient.   History reviewed. No pertinent surgical history.  OB History    No data available       Home Medications    Prior to Admission medications   Medication Sig Start Date End Date Taking? Authorizing Provider  azithromycin (ZITHROMAX) 250 MG tablet 2 tablets on day one, one tablet daily for 4 days 07/04/13   Richarda Overlie, MD  HYDROcodone-acetaminophen (NORCO/VICODIN) 5-325 MG tablet Take 1 tablet by mouth every 4 (four) hours as needed. 01/14/16   Barrett Henle, PA-C  hydrocortisone cream 0.5 % Apply 1 application topically 2 (two) times daily. 07/04/13   Richarda Overlie, MD  ibuprofen  (ADVIL,MOTRIN) 600 MG tablet Take 1 tablet (600 mg total) by mouth every 6 (six) hours as needed. 01/14/16   Barrett Henle, PA-C  predniSONE (DELTASONE) 20 MG tablet Take 1 tablet (20 mg total) by mouth daily with breakfast. 07/04/13   Richarda Overlie, MD  tetrahydrozoline (VISINE) 0.05 % ophthalmic solution Place 1 drop into both eyes 2 (two) times daily. 07/04/13   Richarda Overlie, MD  Vitamin D, Ergocalciferol, (DRISDOL) 50000 UNITS CAPS capsule Take 1 capsule (50,000 Units total) by mouth every 7 (seven) days. 07/09/13   Richarda Overlie, MD    Family History History reviewed. No pertinent family history.  Social History Social History  Substance Use Topics  . Smoking status: Former Smoker    Quit date: 12/23/2011  . Smokeless tobacco: Not on file  . Alcohol use Yes     Comment: occ     Allergies   Rocephin [ceftriaxone sodium in dextrose]   Review of Systems Review of Systems  Cardiovascular: Positive for chest pain (middle; pain in right ribs).  Gastrointestinal: Negative for abdominal pain, nausea and vomiting.  Musculoskeletal: Negative for back pain.  Neurological: Negative for syncope, weakness and numbness.  All other systems reviewed and are negative.   Physical Exam Updated Vital Signs BP 141/99 (BP Location: Right Arm)   Pulse 119   Temp 98.6 F (37 C) (Oral)   Resp 14   SpO2 97%   Physical Exam  Constitutional: She is oriented to person, place, and time. She appears well-developed  and well-nourished.  HENT:  Head: Normocephalic and atraumatic.  Eyes: Conjunctivae and EOM are normal. Right eye exhibits no discharge. Left eye exhibits no discharge. No scleral icterus.  Neck: Normal range of motion. Neck supple.  Cardiovascular: Normal rate, regular rhythm, normal heart sounds and intact distal pulses.   Heart rate 92.  Pulmonary/Chest: Effort normal and breath sounds normal. No respiratory distress. She has no wheezes. She has no rales. She exhibits  tenderness (right mid anterior and lateral chest wall TTP). She exhibits no laceration, no crepitus, no edema, no deformity, no swelling and no retraction.  Abdominal: Soft. She exhibits no distension.  Musculoskeletal: Normal range of motion. She exhibits no edema.  Full active ROM of bilateral upper extremities with 5/5 strength. 2+ radial pulse. Sensation grossly intact.  Neurological: She is alert and oriented to person, place, and time.  Skin: Skin is warm and dry.  Nursing note and vitals reviewed.   ED Treatments / Results  Labs (all labs ordered are listed, but only abnormal results are displayed) Labs Reviewed - No data to display  EKG  EKG Interpretation None       Radiology Dg Ribs Unilateral W/chest Right  Result Date: 01/14/2016 CLINICAL DATA:  Recent fall with right anterior rib pain. EXAM: RIGHT RIBS AND CHEST - 3+ VIEW COMPARISON:  04/23/2012 FINDINGS: Dextroscoliosis of the thoracic spine with the apex at T8-T9. Both lungs are clear. No evidence for pneumothorax. Heart and mediastinum are normal. BB was placed near the anterior eighth rib. Negative for a displaced rib fracture. IMPRESSION: No acute abnormality. Scoliosis. Electronically Signed   By: Richarda Overlie M.D.   On: 01/14/2016 09:26    Procedures Procedures (including critical care time)  DIAGNOSTIC STUDIES: Oxygen Saturation is 97% on RA, normal by my interpretation.    COORDINATION OF CARE: 9:28 AM Discussed treatment plan with pt at bedside and pt agreed to plan.  Medications Ordered in ED Medications  ibuprofen (ADVIL,MOTRIN) tablet 600 mg (600 mg Oral Given 01/14/16 0941)     Initial Impression / Assessment and Plan / ED Course  I have reviewed the triage vital signs and the nursing notes.  Pertinent labs & imaging results that were available during my care of the patient were reviewed by me and considered in my medical decision making (see chart for details).  Clinical Course    Patient  presents with right rib pain after falling on concrete porch while doing her paper route this morning. Denies head injury or LOC. Denies taking any medications prior to arrival. VSS. Exam revealed tenderness over right anterior and lateral chest wall, lungs clear to auscultation bilaterally. Remaining exam unremarkable. No other signs of injury. Right rib x-ray unremarkable. Suspect rib contusion associated with patient's recent fall/injury. Plan to discharge patient home with symptomatic treatment including NSAIDs, pain meds and an incentive spirometer. Discussed results and plan for discharge with patient. Vital patient to follow up with PCP in the next week as needed. Discussed return precautions.  I personally performed the services described in this documentation, which was scribed in my presence. The recorded information has been reviewed and is accurate.   Final Clinical Impressions(s) / ED Diagnoses   Final diagnoses:  Rib contusion, right, initial encounter    New Prescriptions New Prescriptions   HYDROCODONE-ACETAMINOPHEN (NORCO/VICODIN) 5-325 MG TABLET    Take 1 tablet by mouth every 4 (four) hours as needed.   IBUPROFEN (ADVIL,MOTRIN) 600 MG TABLET    Take 1 tablet (600 mg  total) by mouth every 6 (six) hours as needed.     Satira Sarkicole Elizabeth Bird CityNadeau, [place]-C 01/14/16 1001    Zadie Rhineonald Wickline, MD 01/14/16 [phone]

## 2016-01-14 NOTE — Discharge Instructions (Signed)
Take your medications as prescribed as needed for pain relief. You may also apply ice to affected area for 15 minutes 3-4 times daily for pain relief. I recommend using your incentive spirometer at least 3-4 times daily to help exercise your lungs to prevent further complications. Follow-up with your primary care provider in the next week as needed if her symptoms have not improved. Please return to the Emergency Department if symptoms worsen or new onset of fever, chest pain, difficulty breathing, coughing up blood.

## 2016-01-31 ENCOUNTER — Encounter: Payer: Self-pay | Admitting: Obstetrics & Gynecology

## 2016-01-31 ENCOUNTER — Ambulatory Visit: Payer: Medicaid Other

## 2016-01-31 DIAGNOSIS — Z3200 Encounter for pregnancy test, result unknown: Secondary | ICD-10-CM

## 2016-01-31 LAB — POCT PREGNANCY, URINE: Preg Test, Ur: POSITIVE — AB

## 2016-01-31 NOTE — Progress Notes (Signed)
Patient presented to clinic today for a pregnancy test. Test confirms she is pregnant around 7 weeks base on last period. [facility] Medications were reviewed and patient plans to follow up with her new ob provider.

## 2016-03-13 ENCOUNTER — Encounter (HOSPITAL_COMMUNITY): Payer: Self-pay | Admitting: Emergency Medicine

## 2016-03-13 ENCOUNTER — Emergency Department (HOSPITAL_COMMUNITY)
Admission: EM | Admit: 2016-03-13 | Discharge: 2016-03-14 | Discharge status: Against Medical Advice | Payer: Medicaid Other | Attending: Emergency Medicine | Admitting: Emergency Medicine

## 2016-03-13 DIAGNOSIS — Z3A01 Less than 8 weeks gestation of pregnancy: Secondary | ICD-10-CM | POA: Diagnosis not present

## 2016-03-13 DIAGNOSIS — O0281 Inappropriate change in quantitative human chorionic gonadotropin (hCG) in early pregnancy: Secondary | ICD-10-CM | POA: Insufficient documentation

## 2016-03-13 DIAGNOSIS — Z79899 Other long term (current) drug therapy: Secondary | ICD-10-CM | POA: Insufficient documentation

## 2016-03-13 DIAGNOSIS — J45909 Unspecified asthma, uncomplicated: Secondary | ICD-10-CM | POA: Insufficient documentation

## 2016-03-13 DIAGNOSIS — O219 Vomiting of pregnancy, unspecified: Secondary | ICD-10-CM | POA: Insufficient documentation

## 2016-03-13 DIAGNOSIS — Z87891 Personal history of nicotine dependence: Secondary | ICD-10-CM | POA: Insufficient documentation

## 2016-03-13 LAB — URINALYSIS, ROUTINE W REFLEX MICROSCOPIC
GLUCOSE, UA: NEGATIVE mg/dL
KETONES UR: 15 mg/dL — AB
NITRITE: NEGATIVE
PH: 6.5 (ref 5.0–8.0)
Protein, ur: 30 mg/dL — AB
SPECIFIC GRAVITY, URINE: 1.036 — AB (ref 1.005–1.030)

## 2016-03-13 LAB — BASIC METABOLIC PANEL
ANION GAP: 7 (ref 5–15)
BUN: 10 mg/dL (ref 6–20)
CALCIUM: 9.7 mg/dL (ref 8.9–10.3)
CHLORIDE: 105 mmol/L (ref 101–111)
CO2: 23 mmol/L (ref 22–32)
Creatinine, Ser: 0.71 mg/dL (ref 0.44–1.00)
GFR calc non Af Amer: >60 mL/min (ref >60)
GLUCOSE: 121 mg/dL — AB (ref 65–99)
POTASSIUM: 3.6 mmol/L (ref 3.5–5.1)
Sodium: 135 mmol/L (ref 135–145)

## 2016-03-13 LAB — CBC WITH DIFFERENTIAL/PLATELET
BASOS ABS: 0 10*3/uL (ref 0.0–0.1)
BASOS PCT: 0 %
Eosinophils Absolute: 0.1 10*3/uL (ref 0.0–0.7)
Eosinophils Relative: 1 %
HEMATOCRIT: 37.6 % (ref 36.0–46.0)
HEMOGLOBIN: 13.1 g/dL (ref 12.0–15.0)
LYMPHS PCT: 17 %
Lymphs Abs: 2.3 10*3/uL (ref 0.7–4.0)
MCH: 30.2 pg (ref 26.0–34.0)
MCHC: 34.8 g/dL (ref 30.0–36.0)
MCV: 86.6 fL (ref 78.0–100.0)
MONO ABS: 0.6 10*3/uL (ref 0.1–1.0)
Monocytes Relative: 5 %
NEUTROS ABS: 10.3 10*3/uL — AB (ref 1.7–7.7)
NEUTROS PCT: 77 %
Platelets: 263 10*3/uL (ref 150–400)
RBC: 4.34 MIL/uL (ref 3.87–5.11)
RDW: 12.3 % (ref 11.5–15.5)
WBC: 13.3 10*3/uL — ABNORMAL HIGH (ref 4.0–10.5)

## 2016-03-13 LAB — URINE MICROSCOPIC-ADD ON

## 2016-03-13 LAB — HEPATIC FUNCTION PANEL
ALBUMIN: 4.3 g/dL (ref 3.5–5.0)
ALT: 16 U/L (ref 14–54)
AST: 20 U/L (ref 15–41)
Alkaline Phosphatase: 60 U/L (ref 38–126)
Bilirubin, Direct: <0.1 mg/dL — ABNORMAL LOW (ref 0.1–0.5)
TOTAL PROTEIN: 7.2 g/dL (ref 6.5–8.1)
Total Bilirubin: 0.3 mg/dL (ref 0.3–1.2)

## 2016-03-13 LAB — I-STAT BETA HCG BLOOD, ED (MC, WL, AP ONLY): I-stat hCG, quantitative: >2000 m[IU]/mL — ABNORMAL HIGH (ref <5)

## 2016-03-13 LAB — LIPASE, BLOOD: Lipase: 31 U/L (ref 11–51)

## 2016-03-13 MED ORDER — SODIUM CHLORIDE 0.9 % IV BOLUS (SEPSIS)
1000.0000 mL | Freq: Once | INTRAVENOUS | Status: AC
  Administered 2016-03-13: 1000 mL via INTRAVENOUS

## 2016-03-13 MED ORDER — PROMETHAZINE HCL 25 MG/ML IJ SOLN
25.0000 mg | Freq: Once | INTRAMUSCULAR | Status: AC
  Administered 2016-03-13: 25 mg via INTRAVENOUS
  Filled 2016-03-13: qty 1

## 2016-03-13 NOTE — ED Triage Notes (Signed)
Pt. reports persistent emesis for 3 days , pt. stated positive pregnancy test ( LMP 02/01/16) with no prenatal visits .

## 2016-03-13 NOTE — ED Provider Notes (Signed)
[facility] Provider Note   CSN: [id number] Arrival date & time: 03/13/16  2238     History   Chief Complaint Chief Complaint  Patient presents with  . Emesis During Pregnancy    HPI  Blood pressure 111/98, pulse 90, temperature 98.1 F (36.7 C), temperature source Oral, resp. rate 16, last menstrual period 03/03/2016, SpO2 100 %.  Katie Pope is a 35 y.o. female complaining of  Blood episodes of nonbloody, nonbilious, non-coffee-ground emesis starting 3 days ago in a.m. She was able to tolerate ginger ale just prior to arrival, states that she feels weak, states that she was crying today because she's hungry and she wants to eat. She had multiple positive pregnancy tests, last period was on 02/01/2016. She has an appointment with OB/GYN but has not been evaluated in this pregnancy, G2P1. First pregnancy was affected with multiple episodes of emesis as well, otherwise uncomplicated. She denies abdominal pain, vaginal bleeding, abnormal vaginal discharge, dysuria she endorses a mild cramping in the abdomen which she attributes to dehydration she denies abdominal pain, syncope, fever, chills, chest pain, shortness of breath  Past Medical History:  Diagnosis Date  . Asthma   . Bronchitis   . Scoliosis     There are no active problems to display for this patient.   History reviewed. No pertinent surgical history.  OB History    No data available       Home Medications    Prior to Admission medications   Medication Sig Start Date End Date Taking? Authorizing Provider  azithromycin (ZITHROMAX) 250 MG tablet 2 tablets on day one, one tablet daily for 4 days Patient not taking: Reported on 01/31/2016 07/04/13   Richarda Overlie, MD  HYDROcodone-acetaminophen (NORCO/VICODIN) 5-325 MG tablet Take 1 tablet by mouth every 4 (four) hours as needed. Patient not taking: Reported on 01/31/2016 01/14/16   Barrett Henle, PA-C  hydrocortisone cream 0.5 % Apply 1  application topically 2 (two) times daily. Patient not taking: Reported on 01/31/2016 07/04/13   Richarda Overlie, MD  ibuprofen (ADVIL,MOTRIN) 600 MG tablet Take 1 tablet (600 mg total) by mouth every 6 (six) hours as needed. Patient not taking: Reported on 01/31/2016 01/14/16   Barrett Henle, PA-C  predniSONE (DELTASONE) 20 MG tablet Take 1 tablet (20 mg total) by mouth daily with breakfast. Patient not taking: Reported on 01/31/2016 07/04/13   Richarda Overlie, MD  tetrahydrozoline (VISINE) 0.05 % ophthalmic solution Place 1 drop into both eyes 2 (two) times daily. Patient not taking: Reported on 01/31/2016 07/04/13   Richarda Overlie, MD  Vitamin D, Ergocalciferol, (DRISDOL) 50000 UNITS CAPS capsule Take 1 capsule (50,000 Units total) by mouth every 7 (seven) days. Patient not taking: Reported on 01/31/2016 07/09/13   Richarda Overlie, MD    Family History No family history on file.  Social History Social History  Substance Use Topics  . Smoking status: Former Smoker    Quit date: 12/23/2011  . Smokeless tobacco: Never Used  . Alcohol use Yes     Comment: occ     Allergies   Rocephin [ceftriaxone sodium in dextrose]   Review of Systems Review of Systems  10 systems reviewed and found to be negative, except as noted in the HPI.  Physical Exam Updated Vital Signs BP 99/67   Pulse 69   Temp 98.1 F (36.7 C) (Oral)   Resp 17   LMP 03/03/2016   SpO2 100%   Physical Exam  Constitutional: She is oriented  to person, place, and time. She appears well-developed and well-nourished. No distress.  HENT:  Head: Normocephalic and atraumatic.  Mouth/Throat: Oropharynx is clear and moist.  Eyes: Conjunctivae and EOM are normal. Pupils are equal, round, and reactive to light.  Neck: Normal range of motion.  Cardiovascular: Normal rate, regular rhythm and intact distal pulses.   Pulmonary/Chest: Effort normal and breath sounds normal. No respiratory distress. She has no wheezes. She has no  rales. She exhibits no tenderness.  Abdominal: Soft. She exhibits no distension and no mass. There is no tenderness. There is no rebound and no guarding. No hernia.  Genitourinary:  Genitourinary Comments: Pelvic exam a chaperoned by nurse: No rashes or lesions, no abnormal vaginal discharge, no cervical motion or adnexal tenderness  Musculoskeletal: Normal range of motion.  Neurological: She is alert and oriented to person, place, and time.  Skin: She is not diaphoretic.  Psychiatric: She has a normal mood and affect.  Nursing note and vitals reviewed.   ED Treatments / Results  Labs (all labs ordered are listed, but only abnormal results are displayed) Labs Reviewed  WET PREP, GENITAL - Abnormal; Notable for the following:       Result Value   Clue Cells Wet Prep HPF POC PRESENT (*)    WBC, Wet Prep HPF POC MANY (*)    All other components within normal limits  CBC WITH DIFFERENTIAL/PLATELET - Abnormal; Notable for the following:    WBC 13.3 (*)    Neutro Abs 10.3 (*)    All other components within normal limits  BASIC METABOLIC PANEL - Abnormal; Notable for the following:    Glucose, Bld 121 (*)    All other components within normal limits  URINALYSIS, ROUTINE W REFLEX MICROSCOPIC (NOT AT [facility]) - Abnormal; Notable for the following:    Color, Urine AMBER (*)    APPearance TURBID (*)    Specific Gravity, Urine 1.036 (*)    Hgb urine dipstick SMALL (*)    Bilirubin Urine SMALL (*)    Ketones, ur 15 (*)    Protein, ur 30 (*)    Leukocytes, UA MODERATE (*)    All other components within normal limits  HCG, QUANTITATIVE, PREGNANCY - Abnormal; Notable for the following:    hCG, Beta Chain, Quant, S 51,259 (*)    All other components within normal limits  HEPATIC FUNCTION PANEL - Abnormal; Notable for the following:    Bilirubin, Direct <0.1 (*)    All other components within normal limits  URINE MICROSCOPIC-ADD ON - Abnormal; Notable for the following:    Squamous Epithelial  / LPF 6-30 (*)    Bacteria, UA FEW (*)    All other components within normal limits  URINALYSIS, ROUTINE W REFLEX MICROSCOPIC (NOT AT [facility]) - Abnormal; Notable for the following:    Ketones, ur 15 (*)    All other components within normal limits  I-STAT BETA HCG BLOOD, ED ([facility], [facility], [facility] ONLY) - Abnormal; Notable for the following:    I-stat hCG, quantitative >2,000.0 (*)    All other components within normal limits  URINE CULTURE  LIPASE, BLOOD  GC/CHLAMYDIA PROBE AMP ([place]) NOT AT [facility]    EKG  EKG Interpretation None       Radiology US Ob Comp Less 14 Wks  Result Date: 03/14/2016 CLINICAL DATA:  Acute onset of abdominal cramping and vomiting. Initial encounter. EXAM: OBSTETRIC <14 WK [place] AND TRANSVAGINAL OB US TECHNIQUE: Both transabdominal and transvaginal ultrasound examinations were  performed for complete evaluation of the gestation as well as the maternal uterus, adnexal regions, and pelvic cul-de-sac. Transvaginal technique was performed to assess early pregnancy. COMPARISON:  Pelvic ultrasound performed 08/24/2008 FINDINGS: Intrauterine gestational sac: Single; visualized and normal in shape. Yolk sac:  Yes Embryo:  Yes Cardiac Activity: Yes Heart Rate: 117  bpm CRL:  3.2  mm   6 w   0 d                  US EDC: 11/07/2016 Subchorionic hemorrhage: A small amount of subchorionic hemorrhage is noted. Maternal uterus/adnexae: The uterus is otherwise unremarkable. The ovaries are unremarkable in appearance. The right ovary measures 3.0 x 1.9 x 2.6 cm, while the left ovary measures 2.8 x 2.2 x 2.5 cm. No suspicious adnexal masses are seen; there is no evidence for ovarian torsion. Trace free fluid is seen within the pelvic cul-de-sac. IMPRESSION: 1. Single live intrauterine pregnancy noted, with a crown-rump length of 3 mm, corresponding to a gestational age of [redacted] weeks 0 days. This matches the gestational age by LMP, reflecting an estimated date of delivery of November 07, 2016. 2. Small  amount of subchorionic hemorrhage noted. Electronically Signed   By: Roanna RaiderJeffery  Chang M.D.   On: 03/14/2016 01:10   [place] Ob Transvaginal  Result Date: 03/14/2016 CLINICAL DATA:  Acute onset of abdominal cramping and vomiting. Initial encounter. EXAM: OBSTETRIC <14 WK US AND TRANSVAGINAL OB US TECHNIQUE: Both transabdominal and transvaginal ultrasound examinations were performed for complete evaluation of the gestation as well as the maternal uterus, adnexal regions, and pelvic cul-de-sac. Transvaginal technique was performed to assess early pregnancy. COMPARISON:  Pelvic ultrasound performed 08/24/2008 FINDINGS: Intrauterine gestational sac: Single; visualized and normal in shape. Yolk sac:  Yes Embryo:  Yes Cardiac Activity: Yes Heart Rate: 117  bpm CRL:  3.2  mm   6 w   0 d                  US EDC: 11/07/2016 Subchorionic hemorrhage: A small amount of subchorionic hemorrhage is noted. Maternal uterus/adnexae: The uterus is otherwise unremarkable. The ovaries are unremarkable in appearance. The right ovary measures 3.0 x 1.9 x 2.6 cm, while the left ovary measures 2.8 x 2.2 x 2.5 cm. No suspicious adnexal masses are seen; there is no evidence for ovarian torsion. Trace free fluid is seen within the pelvic cul-de-sac. IMPRESSION: 1. Single live intrauterine pregnancy noted, with a crown-rump length of 3 mm, corresponding to a gestational age of [redacted] weeks 0 days. This matches the gestational age by LMP, reflecting an estimated date of delivery of November 07, 2016. 2. Small amount of subchorionic hemorrhage noted. Electronically Signed   By: Roanna RaiderJeffery  Chang M.D.   On: 03/14/2016 01:10    Procedures Procedures (including critical care time)  Medications Ordered in ED Medications  sodium chloride 0.9 % bolus 1,000 mL (0 mLs Intravenous Stopped 03/14/16 0027)  promethazine (PHENERGAN) injection 25 mg (25 mg Intravenous Given 03/13/16 2331)     Initial Impression / Assessment and Plan / ED Course  I have reviewed  the triage vital signs and the nursing notes.  Pertinent labs & imaging results that were available during my care of the patient were reviewed by me and considered in my medical decision making (see chart for details).  Clinical Course     Vitals:   03/13/16 2242 03/14/16 0000 03/14/16 0015 03/14/16 0215  BP: 111/98 101/84 107/79 99/67  Pulse: 90 70  82 69  Resp: 16   17  Temp: 98.1 F (36.7 C)     TempSrc: Oral     SpO2: 100% 100% 100% 100%    Medications  sodium chloride 0.9 % bolus 1,000 mL (0 mLs Intravenous Stopped 03/14/16 0027)  promethazine (PHENERGAN) injection 25 mg (25 mg Intravenous Given 03/13/16 2331)    Elwin SleightJennifer D Laursen is 35 y.o. female presenting with Persistent emesis for 3 days, has had multiple positive home pregnancy tests she has mild lower abdominal discomfort.  Pelvic exam without significant abnormality. Contaminated urinalysis is not consistent with infection, she does appear mildly dehydrated by elevated specific gravity. We will recollect clean-catch for culture.  Ultrasound with IUP, 6 weeks, small subchorionic hemorrhage.  Clean catch of urine has resulted and does not show any leukocytes. She does have clue cells on wet prep. Discussed with attending's recommends treatment, try to call this patient cannot get in touch with her. I've hand written a prescription for Flagyl 500 twice a day. I've asked charge nurse Toniann FailWendy to make sure that this patient is contacted to make sure she comes and picks up the prescription.   Final Clinical Impressions(s) / ED Diagnoses   Final diagnoses:  Nausea and vomiting during pregnancy prior to [redacted] weeks gestation    New Prescriptions Discharge Medication List as of 03/14/2016  2:34 AM       Wynetta EmeryNicole Rick Warnick, PA-C 03/14/16 [phone]    Layla MawKristen N Ward, DO 03/14/16 [id number]

## 2016-03-14 ENCOUNTER — Emergency Department (HOSPITAL_COMMUNITY): Payer: Medicaid Other

## 2016-03-14 ENCOUNTER — Telehealth: Payer: Self-pay | Admitting: *Deleted

## 2016-03-14 LAB — WET PREP, GENITAL
Sperm: NONE SEEN
TRICH WET PREP: NONE SEEN
YEAST WET PREP: NONE SEEN

## 2016-03-14 LAB — GC/CHLAMYDIA PROBE AMP (~~LOC~~) NOT AT ARMC
Chlamydia: NEGATIVE
NEISSERIA GONORRHEA: NEGATIVE

## 2016-03-14 LAB — URINALYSIS, ROUTINE W REFLEX MICROSCOPIC
BILIRUBIN URINE: NEGATIVE
GLUCOSE, UA: NEGATIVE mg/dL
HGB URINE DIPSTICK: NEGATIVE
KETONES UR: 15 mg/dL — AB
Leukocytes, UA: NEGATIVE
Nitrite: NEGATIVE
PH: 7.5 (ref 5.0–8.0)
Protein, ur: NEGATIVE mg/dL
Specific Gravity, Urine: 1.009 (ref 1.005–1.030)

## 2016-03-14 LAB — HCG, QUANTITATIVE, PREGNANCY: hCG, Beta Chain, Quant, S: 51259 m[IU]/mL — ABNORMAL HIGH (ref <5)

## 2016-03-14 NOTE — ED Notes (Signed)
Patient transported to Ultrasound 

## 2016-03-14 NOTE — ED Notes (Addendum)
Pt ambulated to br

## 2016-03-14 NOTE — Care Management (Signed)
ED CM received call from patient regarding discharge prescription she did not received because she left before discharge instruction were given. CM reviewed the records prescription for Flagyl 500mg  BID x 7 days was left with nurse. CM unable to locate prescription. Discussed with T. Kirinchenko PA-C, prescription called in to [facility] on [address], Patient made aware to contact pharmacy. No further CM needs identified.

## 2016-03-14 NOTE — ED Notes (Signed)
Pt stated that she could no longer wait to be evaluated. Pt stated that she would not wait to be reassessed by provider. IV line removed. Pt requested to be called regarding results of tests. Pt ambulated out of ED with steady gait. In NAD. 

## 2016-03-14 NOTE — ED Notes (Signed)
Pt stated that she could no longer wait to be evaluated. Pt stated that she would not wait to be reassessed by provider. IV line removed. Pt requested to be called regarding results of tests. Pt ambulated out of ED with steady gait. In NAD.

## 2016-03-14 NOTE — Telephone Encounter (Signed)
Pt called stating she retrieved her medical records that stated she was called to pick up Flagyl. EDCM reviewed chart and did not find note pt was speaking of.  Advised pt to visit PCP for more testing.

## 2016-03-15 LAB — URINE CULTURE

## 2016-04-13 DIAGNOSIS — Z3490 Encounter for supervision of normal pregnancy, unspecified, unspecified trimester: Secondary | ICD-10-CM | POA: Insufficient documentation

## 2016-04-14 ENCOUNTER — Encounter: Payer: Self-pay | Admitting: Obstetrics

## 2016-04-14 ENCOUNTER — Other Ambulatory Visit (HOSPITAL_COMMUNITY)
Admission: RE | Admit: 2016-04-14 | Discharge: 2016-04-14 | Disposition: A | Discharge status: Home / Self Care | Payer: Medicaid Other | Source: Ambulatory Visit | Attending: Obstetrics | Admitting: Obstetrics

## 2016-04-14 ENCOUNTER — Ambulatory Visit (INDEPENDENT_AMBULATORY_CARE_PROVIDER_SITE_OTHER): Payer: Medicaid Other | Admitting: Obstetrics

## 2016-04-14 VITALS — BP 103/73 | HR 88 | Temp 98.6°F | Wt 127.6 lb

## 2016-04-14 DIAGNOSIS — Z01419 Encounter for gynecological examination (general) (routine) without abnormal findings: Secondary | ICD-10-CM | POA: Insufficient documentation

## 2016-04-14 DIAGNOSIS — Z3400 Encounter for supervision of normal first pregnancy, unspecified trimester: Secondary | ICD-10-CM

## 2016-04-14 DIAGNOSIS — Z3480 Encounter for supervision of other normal pregnancy, unspecified trimester: Secondary | ICD-10-CM

## 2016-04-14 DIAGNOSIS — Z3481 Encounter for supervision of other normal pregnancy, first trimester: Secondary | ICD-10-CM

## 2016-04-14 DIAGNOSIS — Z23 Encounter for immunization: Secondary | ICD-10-CM

## 2016-04-14 DIAGNOSIS — Z1151 Encounter for screening for human papillomavirus (HPV): Secondary | ICD-10-CM | POA: Insufficient documentation

## 2016-04-14 DIAGNOSIS — Z3401 Encounter for supervision of normal first pregnancy, first trimester: Secondary | ICD-10-CM

## 2016-04-14 MED ORDER — CITRANATAL HARMONY 27-1-260 MG PO CAPS: 1.0000 | ORAL_CAPSULE | Freq: Every day | ORAL | 3 refills | Status: DC

## 2016-04-14 NOTE — Progress Notes (Signed)
Subjective:    Katie Pope is being seen today for her first obstetrical visit.  This is a planned pregnancy. She is at 27w3dgestation. Her obstetrical history is significant for Nothing. Relationship with FOB: significant other, not living together. Patient does intend to breast feed. Pregnancy history fully reviewed.  The information documented in the HPI was reviewed and verified.  Menstrual History: OB History    Gravida Para Term Preterm AB Living   1             SAB TAB Ectopic Multiple Live Births                  Patient's last menstrual period was 03/03/2016.    Past Medical History:  Diagnosis Date  . Asthma   . Bronchitis   . Scoliosis     History reviewed. No pertinent surgical history.   (Not in a hospital admission) Allergies  Allergen Reactions  . Rocephin [Ceftriaxone Sodium In Dextrose] Rash    Social History  Substance Use Topics  . Smoking status: Former Smoker    Quit date: 12/23/2011  . Smokeless tobacco: Never Used  . Alcohol use No     Comment: occ    Family History  Problem Relation Age of Onset  . Diabetes Mother      Review of Systems Constitutional: negative for weight loss Gastrointestinal: negative for vomiting Genitourinary:negative for genital lesions and vaginal discharge and dysuria Musculoskeletal:negative for back pain Behavioral/Psych: negative for abusive relationship, depression, illegal drug usage and tobacco use    Objective:    BP 103/73   Pulse 88   Temp 98.6 F (37 C)   Wt 127 lb 9.6 oz (57.9 kg)   LMP 03/03/2016   BMI 20.91 kg/m  General Appearance:    Alert, cooperative, no distress, appears stated age  Head:    Normocephalic, without obvious abnormality, atraumatic  Eyes:    PERRL, conjunctiva/corneas clear, EOM's intact, fundi    benign, both eyes  Ears:    Normal TM's and external ear canals, both ears  Nose:   Nares normal, septum midline, mucosa normal, no drainage    or sinus tenderness   Throat:   Lips, mucosa, and tongue normal; teeth and gums normal  Neck:   Supple, symmetrical, trachea midline, no adenopathy;    thyroid:  no enlargement/tenderness/nodules; no carotid   bruit or JVD  Back:     Symmetric, no curvature, ROM normal, no CVA tenderness  Lungs:     Clear to auscultation bilaterally, respirations unlabored  Chest Wall:    No tenderness or deformity   Heart:    Regular rate and rhythm, S1 and S2 normal, no murmur, rub   or gallop  Breast Exam:    No tenderness, masses, or nipple abnormality  Abdomen:     Soft, non-tender, bowel sounds active all four quadrants,    no masses, no organomegaly  Genitalia:    Normal female without lesion, discharge or tenderness  Extremities:   Extremities normal, atraumatic, no cyanosis or edema  Pulses:   2+ and symmetric all extremities  Skin:   Skin color, texture, turgor normal, no rashes or lesions  Lymph nodes:   Cervical, supraclavicular, and axillary nodes normal  Neurologic:   CNII-XII intact, normal strength, sensation and reflexes    throughout      Lab Review Urine pregnancy test Labs reviewed yes Radiologic studies reviewed yes Assessment:    Pregnancy at 157w3deeks  Plan:      Prenatal vitamins.  Counseling provided regarding continued use of seat belts, cessation of alcohol consumption, smoking or use of illicit drugs; infection precautions i.e., influenza/TDAP immunizations, toxoplasmosis,CMV, parvovirus, listeria and varicella; workplace safety, exercise during pregnancy; routine dental care, safe medications, sexual activity, hot tubs, saunas, pools, travel, caffeine use, fish and methlymercury, potential toxins, hair treatments, varicose veins Weight gain recommendations per IOM guidelines reviewed: underweight/BMI< 18.5--> gain 28 - 40 lbs; normal weight/BMI 18.5 - 24.9--> gain 25 - 35 lbs; overweight/BMI 25 - 29.9--> gain 15 - 25 lbs; obese/BMI >30->gain  11 - 20 lbs Problem list reviewed and  updated. FIRST/CF mutation testing/NIPT/QUAD SCREEN/fragile X/Ashkenazi Jewish population testing/Spinal muscular atrophy discussed: requested. Role of ultrasound in pregnancy discussed; fetal survey: requested. Amniocentesis discussed: not indicated. VBAC calculator score: VBAC consent form provided Meds ordered this encounter  Medications  . Prenat-FeFmCb-DSS-FA-DHA w/o A (CITRANATAL HARMONY) 27-1-260 MG CAPS    Sig: Take 1 capsule by mouth daily before breakfast.    Dispense:  90 capsule    Refill:  3   Orders Placed This Encounter  Procedures  . Culture, OB Urine  . Flu Vaccine QUAD 36+ mos IM  . Obstetric Panel, Including HIV  . NuSwab Vaginitis Plus (VG+)  . Varicella zoster antibody, IgG  . ToxASSURE Select 13 (MW), Urine  . Vitamin D (25 hydroxy)  . HIV antibody (with reflex)  . Comp Met (CMET)    Follow up in 4 weeks. 50% of 20 min visit spent on counseling and coordination of care. Patient ID: Katie Pope, female   DOB: 07-17-1980, 35 y.o.   MRN: [id number]

## 2016-04-15 LAB — COMPREHENSIVE METABOLIC PANEL
ALK PHOS: 53 IU/L (ref 39–117)
ALT: 13 IU/L (ref 0–32)
AST: 12 IU/L (ref 0–40)
Albumin/Globulin Ratio: 1.6 (ref 1.2–2.2)
Albumin: 3.9 g/dL (ref 3.5–5.5)
BUN/Creatinine Ratio: 13 (ref 9–23)
BUN: 9 mg/dL (ref 6–20)
Bilirubin Total: 0.3 mg/dL (ref 0.0–1.2)
CALCIUM: 9.4 mg/dL (ref 8.7–10.2)
CO2: 22 mmol/L (ref 18–29)
CREATININE: 0.7 mg/dL (ref 0.57–1.00)
Chloride: 99 mmol/L (ref 96–106)
GFR calc Af Amer: 129 mL/min/{1.73_m2} (ref >59)
GFR, EST NON AFRICAN AMERICAN: 112 mL/min/{1.73_m2} (ref >59)
GLOBULIN, TOTAL: 2.4 g/dL (ref 1.5–4.5)
GLUCOSE: 74 mg/dL (ref 65–99)
Potassium: 4 mmol/L (ref 3.5–5.2)
SODIUM: 136 mmol/L (ref 134–144)
Total Protein: 6.3 g/dL (ref 6.0–8.5)

## 2016-04-15 LAB — HIV ANTIBODY (ROUTINE TESTING W REFLEX): HIV Screen 4th Generation wRfx: NONREACTIVE

## 2016-04-16 LAB — URINE CULTURE, OB REFLEX

## 2016-04-16 LAB — CULTURE, OB URINE

## 2016-04-19 LAB — OBSTETRIC PANEL, INCLUDING HIV
Antibody Screen: NEGATIVE
BASOS ABS: 0 10*3/uL (ref 0.0–0.2)
Basos: 0 %
EOS (ABSOLUTE): 0.3 10*3/uL (ref 0.0–0.4)
Eos: 3 %
HEMOGLOBIN: 13.1 g/dL (ref 11.1–15.9)
HEP B S AG: NEGATIVE
HIV Screen 4th Generation wRfx: NONREACTIVE
Hematocrit: 38.3 % (ref 34.0–46.6)
IMMATURE GRANS (ABS): 0 10*3/uL (ref 0.0–0.1)
IMMATURE GRANULOCYTES: 0 %
LYMPHS: 29 %
Lymphocytes Absolute: 2.9 10*3/uL (ref 0.7–3.1)
MCH: 30.1 pg (ref 26.6–33.0)
MCHC: 34.2 g/dL (ref 31.5–35.7)
MCV: 88 fL (ref 79–97)
MONOCYTES: 6 %
Monocytes Absolute: 0.6 10*3/uL (ref 0.1–0.9)
NEUTROS PCT: 62 %
Neutrophils Absolute: 6.3 10*3/uL (ref 1.4–7.0)
PLATELETS: 252 10*3/uL (ref 150–379)
RBC: 4.35 x10E6/uL (ref 3.77–5.28)
RDW: 12.4 % (ref 12.3–15.4)
RPR: NONREACTIVE
RUBELLA: 1.92 {index} (ref >0.99)
Rh Factor: POSITIVE
WBC: 10.2 10*3/uL (ref 3.4–10.8)

## 2016-04-19 LAB — CYTOLOGY - PAP
DIAGNOSIS: NEGATIVE
HPV (WINDOPATH): NOT DETECTED

## 2016-04-19 LAB — VITAMIN D 25 HYDROXY (VIT D DEFICIENCY, FRACTURES): VIT D 25 HYDROXY: 31.6 ng/mL (ref 30.0–100.0)

## 2016-04-19 LAB — VARICELLA ZOSTER ANTIBODY, IGG: VARICELLA: 2660 {index} (ref >165)

## 2016-04-21 LAB — NUSWAB VAGINITIS PLUS (VG+)
Candida albicans, NAA: NEGATIVE
Candida glabrata, NAA: NEGATIVE
Chlamydia trachomatis, NAA: NEGATIVE
Neisseria gonorrhoeae, NAA: NEGATIVE
Trich vag by NAA: NEGATIVE

## 2016-04-24 LAB — TOXASSURE SELECT 13 (MW), URINE

## 2016-04-24 NOTE — L&D Delivery Note (Signed)
Delivery Note Pt admitted 5/28 w/ vaginal bleeding, had PPROM on 6/3. Induced 6/5 @ 34.0wks w/ pitocin.  NICU called to be in attendance of birth d/t prematurity.  At 9:28 PM a viable female was delivered via Vaginal, Spontaneous Delivery (Presentation: ROA). Infant with spontaneous cry placed directly on mom's abdomen for bonding/skin-to-skin. Delayed cord clamping x 1min, then cord clamped x 2, and cut by FOB and taken to radiant warmer for assessment by NICU team. APGAR: 8/7 ; weight:pending at time of note.   Placenta status: delivered spontaneously, intact.  Cord: 3VC, with the following complications: none.  Cord pH: n/a  Anesthesia:  Received 1 dose of Fentanyl @ 2103 Episiotomy: None Lacerations: None Suture Repair: n/a Est. Blood Loss (mL): 50  Birth by Lavella HammockAlessandra Tomasi, MS, supervised directly by me w/ hands on guidance Mom to postpartum.  Baby to NICU. Placenta to path Plans to bottlefeed, outpatient circ, undecided about contraception  Marge DuncansBooker, Kimberly Randall 09/26/2016, 9:47 PM

## 2016-04-26 ENCOUNTER — Telehealth: Payer: Self-pay | Admitting: *Deleted

## 2016-04-26 NOTE — Telephone Encounter (Signed)
Pt called to office for lab results. Returned call to pt.  Labs results reviewed.

## 2016-05-11 ENCOUNTER — Ambulatory Visit (INDEPENDENT_AMBULATORY_CARE_PROVIDER_SITE_OTHER): Payer: Medicaid Other | Admitting: Family Medicine

## 2016-05-11 ENCOUNTER — Encounter: Payer: Self-pay | Admitting: Family Medicine

## 2016-05-11 VITALS — BP 108/69 | HR 70 | Temp 99.0°F | Wt 124.6 lb

## 2016-05-11 DIAGNOSIS — Z3400 Encounter for supervision of normal first pregnancy, unspecified trimester: Secondary | ICD-10-CM

## 2016-05-11 DIAGNOSIS — O09529 Supervision of elderly multigravida, unspecified trimester: Secondary | ICD-10-CM | POA: Insufficient documentation

## 2016-05-11 DIAGNOSIS — O09522 Supervision of elderly multigravida, second trimester: Secondary | ICD-10-CM

## 2016-05-11 NOTE — Progress Notes (Signed)
Patient is in the office, states that she feels good today. 

## 2016-05-11 NOTE — Progress Notes (Signed)
   PRENATAL VISIT NOTE  Subjective:  Katie SleightJennifer D Schutt is a 36 y.o. G2P1001 at 6039w2d being seen today for ongoing prenatal care.  She is currently monitored for the following issues for this low-risk pregnancy and has Supervision of normal pregnancy and AMA (advanced maternal age) multigravida 35+ on her problem list.  Patient reports food aversion.  Contractions: Not present. Vag. Bleeding: None.   . Denies leaking of fluid.   The following portions of the patient's history were reviewed and updated as appropriate: allergies, current medications, past family history, past medical history, past social history, past surgical history and problem list. Problem list updated.  Objective:   Vitals:   05/11/16 1604  BP: 108/69  Pulse: 70  Temp: 99 F (37.2 C)  Weight: 124 lb 9.6 oz (56.5 kg)    Fetal Status: Fetal Heart Rate (bpm): 154         General:  Alert, oriented and cooperative. Patient is in no acute distress.  Skin: Skin is warm and dry. No rash noted.   Cardiovascular: Normal heart rate noted  Respiratory: Normal respiratory effort, no problems with respiration noted  Abdomen: Soft, gravid, appropriate for gestational age. Pain/Pressure: Absent     Pelvic:  Cervical exam deferred        Extremities: Normal range of motion.  Edema: None  Mental Status: Normal mood and affect. Normal behavior. Normal judgment and thought content.   Assessment and Plan:  Pregnancy: G2P1001 at 4639w2d  1. Supervision of normal first pregnancy, antepartum Anatomy u/s ordered Baby Scripts candidate - US MFM OB COMP + 14 WK; Future  2. Elderly multigravida in second trimester Genetic testing - MaterniT21 PLUS Core  General obstetric precautions including but not limited to vaginal bleeding, contractions, leaking of fluid and fetal movement were reviewed in detail with the patient. Please refer to After Visit Summary for other counseling recommendations.  Return in 6 weeks (on  06/22/2016).   Reva Boresanya S Claressa Hughley, MD

## 2016-05-11 NOTE — Patient Instructions (Signed)
Second Trimester of Pregnancy The second trimester is from week 13 through week 28 (months 4 through 6). The second trimester is often a time when you feel your best. Your body has also adjusted to being pregnant, and you begin to feel better physically. Usually, morning sickness has lessened or quit completely, you may have more energy, and you may have an increase in appetite. The second trimester is also a time when the fetus is growing rapidly. At the end of the sixth month, the fetus is about 9 inches long and weighs about 1 pounds. You will likely begin to feel the baby move (quickening) between 18 and 20 weeks of the pregnancy. Body changes during your second trimester Your body continues to go through many changes during your second trimester. The changes vary from woman to woman.  Your weight will continue to increase. You will notice your lower abdomen bulging out.  You may begin to get stretch marks on your hips, abdomen, and breasts.  You may develop headaches that can be relieved by medicines. The medicines should be approved by your health care provider.  You may urinate more often because the fetus is pressing on your bladder.  You may develop or continue to have heartburn as a result of your pregnancy.  You may develop constipation because certain hormones are causing the muscles that push waste through your intestines to slow down.  You may develop hemorrhoids or swollen, bulging veins (varicose veins).  You may have back pain. This is caused by:  Weight gain.  Pregnancy hormones that are relaxing the joints in your pelvis.  A shift in weight and the muscles that support your balance.  Your breasts will continue to grow and they will continue to become tender.  Your gums may bleed and may be sensitive to brushing and flossing.  Dark spots or blotches (chloasma, mask of pregnancy) may develop on your face. This will likely fade after the baby is born.  A dark line  from your belly button to the pubic area (linea nigra) may appear. This will likely fade after the baby is born.  You may have changes in your hair. These can include thickening of your hair, rapid growth, and changes in texture. Some women also have hair loss during or after pregnancy, or hair that feels dry or thin. Your hair will most likely return to normal after your baby is born. What to expect at prenatal visits During a routine prenatal visit:  You will be weighed to make sure you and the fetus are growing normally.  Your blood pressure will be taken.  Your abdomen will be measured to track your baby's growth.  The fetal heartbeat will be listened to.  Any test results from the previous visit will be discussed. Your health care provider may ask you:  How you are feeling.  If you are feeling the baby move.  If you have had any abnormal symptoms, such as leaking fluid, bleeding, severe headaches, or abdominal cramping.  If you are using any tobacco products, including cigarettes, chewing tobacco, and electronic cigarettes.  If you have any questions. Other tests that may be performed during your second trimester include:  Blood tests that check for:  Low iron levels (anemia).  Gestational diabetes (between 24 and 28 weeks).  Rh antibodies. This is to check for a protein on red blood cells (Rh factor).  Urine tests to check for infections, diabetes, or protein in the urine.  An ultrasound to   confirm the proper growth and development of the baby.  An amniocentesis to check for possible genetic problems.  Fetal screens for spina bifida and Down syndrome.  HIV (human immunodeficiency virus) testing. Routine prenatal testing includes screening for HIV, unless you choose not to have this test. Follow these instructions at home: Eating and drinking  Continue to eat regular, healthy meals.  Avoid raw meat, uncooked cheese, cat litter boxes, and soil used by cats. These  carry germs that can cause birth defects in the baby.  Take your prenatal vitamins.  Take 1500-2000 mg of calcium daily starting at the 20th week of pregnancy until you deliver your baby.  If you develop constipation:  Take over-the-counter or prescription medicines.  Drink enough fluid to keep your urine clear or pale yellow.  Eat foods that are high in fiber, such as fresh fruits and vegetables, whole grains, and beans.  Limit foods that are high in fat and processed sugars, such as fried and sweet foods. Activity  Exercise only as directed by your health care provider. Experiencing uterine cramps is a good sign to stop exercising.  Avoid heavy lifting, wear low heel shoes, and practice good posture.  Wear your seat belt at all times when driving.  Rest with your legs elevated if you have leg cramps or low back pain.  Wear a good support bra for breast tenderness.  Do not use hot tubs, steam rooms, or saunas. Lifestyle  Avoid all smoking, herbs, alcohol, and unprescribed drugs. These chemicals affect the formation and growth of the baby.  Do not use any products that contain nicotine or tobacco, such as cigarettes and e-cigarettes. If you need help quitting, ask your health care provider.  A sexual relationship may be continued unless your health care provider directs you otherwise. General instructions  Follow your health care provider's instructions regarding medicine use. There are medicines that are either safe or unsafe to take during pregnancy.  Take warm sitz baths to soothe any pain or discomfort caused by hemorrhoids. Use hemorrhoid cream if your health care provider approves.  If you develop varicose veins, wear support hose. Elevate your feet for 15 minutes, 3-4 times a day. Limit salt in your diet.  Visit your dentist if you have not gone yet during your pregnancy. Use a soft toothbrush to brush your teeth and be gentle when you floss.  Keep all follow-up  prenatal visits as told by your health care provider. This is important. Contact a health care provider if:  You have dizziness.  You have mild pelvic cramps, pelvic pressure, or nagging pain in the abdominal area.  You have persistent nausea, vomiting, or diarrhea.  You have a bad smelling vaginal discharge.  You have pain with urination. Get help right away if:  You have a fever.  You are leaking fluid from your vagina.  You have spotting or bleeding from your vagina.  You have severe abdominal cramping or pain.  You have rapid weight gain or weight loss.  You have shortness of breath with chest pain.  You notice sudden or extreme swelling of your face, hands, ankles, feet, or legs.  You have not felt your baby move in over an hour.  You have severe headaches that do not go away with medicine.  You have vision changes. Summary  The second trimester is from week 13 through week 28 (months 4 through 6). It is also a time when the fetus is growing rapidly.  Your body goes   through many changes during pregnancy. The changes vary from woman to woman.  Avoid all smoking, herbs, alcohol, and unprescribed drugs. These chemicals affect the formation and growth your baby.  Do not use any tobacco products, such as cigarettes, chewing tobacco, and e-cigarettes. If you need help quitting, ask your health care provider.  Contact your health care provider if you have any questions. Keep all prenatal visits as told by your health care provider. This is important. This information is not intended to replace advice given to you by your health care provider. Make sure you discuss any questions you have with your health care provider. Document Released: 04/04/2001 Document Revised: 09/16/2015 Document Reviewed: 06/11/2012 Elsevier Interactive Patient Education  2017 [employer]  

## 2016-05-15 ENCOUNTER — Telehealth: Payer: Self-pay

## 2016-05-15 NOTE — Telephone Encounter (Signed)
Pt c/o N&V. Pt informed to try Unisom 25 mg 1 tab po qhs and B6 50 mg po BID. If this does not work, pt to contact the office for further txt. Pt agrees and has no further questions.

## 2016-05-18 LAB — MATERNIT 21 PLUS CORE, BLOOD
CHROMOSOME 13: NEGATIVE
CHROMOSOME 18: NEGATIVE
CHROMOSOME 21: NEGATIVE
PDF: 0
Y CHROMOSOME: DETECTED

## 2016-05-22 ENCOUNTER — Encounter (HOSPITAL_COMMUNITY): Payer: Self-pay | Admitting: Emergency Medicine

## 2016-05-22 ENCOUNTER — Ambulatory Visit (HOSPITAL_COMMUNITY)
Admission: EM | Admit: 2016-05-22 | Discharge: 2016-05-22 | Disposition: A | Discharge status: Home / Self Care | Payer: Medicaid Other | Attending: Emergency Medicine | Admitting: Emergency Medicine

## 2016-05-22 DIAGNOSIS — M546 Pain in thoracic spine: Secondary | ICD-10-CM

## 2016-05-22 DIAGNOSIS — T148XXA Other injury of unspecified body region, initial encounter: Secondary | ICD-10-CM | POA: Diagnosis not present

## 2016-05-22 NOTE — ED Triage Notes (Addendum)
Patient reports initially pain in right shoulder blade, pain moving down back and now right leg feels weaker.  Patient not sure of a specific injury, but recalls trying to move a wheelchair with family member in it over a curb.  Patient is pregnant

## 2016-05-22 NOTE — Discharge Instructions (Signed)
At this time apply heat to the areas of pain 3-4 times a day if possible. After applying heat perform the stretches as demonstrated. Limit activity with the right upper extremity and movements that make the pain worse such as driving, lifting and pulling. He may take Tylenol for pain but no anti-inflammatories since you are pregnant. His long as you do not reinjured her shoulder in you perform the gentle stretches on a regular basis and apply heat he should be getting better in less than a week.

## 2016-05-22 NOTE — ED Provider Notes (Signed)
CSN: [id number]     Arrival date & time 05/22/16  0957 History   First MD Initiated Contact with Patient 05/22/16 1045     Chief Complaint  Patient presents with  . Back Pain   (Consider location/radiation/quality/duration/timing/severity/associated sxs/prior Treatment) A little over week ago this 36 year old female was pushing her heavy boyfriend in to the emergency department via wheelchair and noticed the next day that she had pain in her right back merrily along the medial aspect of the right scapula, infra scapularis and along the right parathoracic musculature. This developed the following day. It is worse with certain movements and better with other movements. She demonstrates full range of motion of the right shoulder. Denies falls or trauma. Denies cough or shortness of breath.      Past Medical History:  Diagnosis Date  . Asthma   . Bronchitis   . Scoliosis    History reviewed. No pertinent surgical history. Family History  Problem Relation Age of Onset  . Diabetes Mother    Social History  Substance Use Topics  . Smoking status: Former Smoker    Quit date: 12/23/2011  . Smokeless tobacco: Never Used  . Alcohol use No     Comment: occ   OB History    Gravida Para Term Preterm AB Living   2 1 1     1    SAB TAB Ectopic Multiple Live Births                 Review of Systems  Constitutional: Negative for activity change, chills and fever.  HENT: Negative.   Respiratory: Negative.   Cardiovascular: Negative.   Musculoskeletal: Positive for back pain and myalgias. Negative for neck pain and neck stiffness.       As per HPI  Skin: Negative for color change, pallor and rash.  Neurological: Negative.   All other systems reviewed and are negative.   Allergies  Rocephin [ceftriaxone sodium in dextrose]  Home Medications   Prior to Admission medications   Medication Sig Start Date End Date Taking? Authorizing Provider  Prenat-FeFmCb-DSS-FA-DHA w/o A (CITRANATAL  HARMONY) 27-1-260 MG CAPS Take 1 capsule by mouth daily before breakfast. 04/14/16   Brock Badharles A Harper, MD   Meds Ordered and Administered this Visit  Medications - No data to display  BP 115/77 (BP Location: Right Arm)   Pulse 86   Temp 98.4 F (36.9 C) (Oral)   Resp 18   LMP 03/03/2016   SpO2 98%  No data found.   Physical Exam  Constitutional: She is oriented to person, place, and time. She appears well-developed and well-nourished. No distress.  HENT:  Head: Normocephalic and atraumatic.  Eyes: EOM are normal.  Neck: Normal range of motion. Neck supple.  Cardiovascular: Normal rate.   Pulmonary/Chest: Effort normal and breath sounds normal. She has no wheezes.  Musculoskeletal: Normal range of motion.  There is tenderness across the right trapezius, supra and infra spinatus musculature, right parathoracic and lesser right para lumbar musculature. She is able to flex forward without spinal pain. There is no spinal tenderness. There is tenderness along the above muscle groups. No bony tenderness. Patient is able to demonstrate full range of motion of both shoulders. No weakness in the upper or lower extremities.  Neurological: She is alert and oriented to person, place, and time. No cranial nerve deficit.  Skin: Skin is warm and dry. Capillary refill takes less than 2 seconds.  Psychiatric: She has a normal mood and affect.  Nursing note and vitals reviewed.   Urgent Care Course     Procedures (including critical care time)  Labs Review Labs Reviewed - No data to display  Imaging Review No results found.   Visual Acuity Review  Right Eye Distance:   Left Eye Distance:   Bilateral Distance:    Right Eye Near:   Left Eye Near:    Bilateral Near:         MDM   1. Muscle strain   2. Acute right-sided thoracic back pain    At this time apply heat to the areas of pain 3-4 times a day if possible. After applying heat perform the stretches as demonstrated. Limit  activity with the right upper extremity and movements that make the pain worse such as driving, lifting and pulling. He may take Tylenol for pain but no anti-inflammatories since you are pregnant. His long as you do not reinjured her shoulder in you perform the gentle stretches on a regular basis and apply heat he should be getting better in less than a week.    Hayden Rasmussen, NP 05/22/16 1104

## 2016-06-08 ENCOUNTER — Encounter (HOSPITAL_COMMUNITY): Payer: Self-pay | Admitting: Family Medicine

## 2016-06-12 ENCOUNTER — Encounter (HOSPITAL_COMMUNITY): Payer: Self-pay

## 2016-06-13 ENCOUNTER — Encounter (HOSPITAL_COMMUNITY): Payer: Self-pay

## 2016-06-13 ENCOUNTER — Ambulatory Visit (HOSPITAL_COMMUNITY)
Admission: RE | Admit: 2016-06-13 | Discharge: 2016-06-13 | Disposition: A | Discharge status: Home / Self Care | Payer: Medicaid Other | Source: Ambulatory Visit | Attending: Family Medicine | Admitting: Family Medicine

## 2016-06-13 ENCOUNTER — Other Ambulatory Visit: Payer: Self-pay | Admitting: Family Medicine

## 2016-06-13 ENCOUNTER — Ambulatory Visit (HOSPITAL_COMMUNITY): Payer: Medicaid Other

## 2016-06-13 DIAGNOSIS — O09512 Supervision of elderly primigravida, second trimester: Secondary | ICD-10-CM

## 2016-06-13 DIAGNOSIS — Z3689 Encounter for other specified antenatal screening: Secondary | ICD-10-CM | POA: Insufficient documentation

## 2016-06-13 DIAGNOSIS — Z3400 Encounter for supervision of normal first pregnancy, unspecified trimester: Secondary | ICD-10-CM

## 2016-06-13 DIAGNOSIS — Z3A19 19 weeks gestation of pregnancy: Secondary | ICD-10-CM | POA: Insufficient documentation

## 2016-06-13 DIAGNOSIS — O09522 Supervision of elderly multigravida, second trimester: Secondary | ICD-10-CM

## 2016-06-22 ENCOUNTER — Ambulatory Visit (INDEPENDENT_AMBULATORY_CARE_PROVIDER_SITE_OTHER): Payer: Medicaid Other | Admitting: Obstetrics and Gynecology

## 2016-06-22 VITALS — BP 113/74 | HR 91 | Wt 126.0 lb

## 2016-06-22 DIAGNOSIS — O09522 Supervision of elderly multigravida, second trimester: Secondary | ICD-10-CM

## 2016-06-22 DIAGNOSIS — Z3400 Encounter for supervision of normal first pregnancy, unspecified trimester: Secondary | ICD-10-CM

## 2016-06-22 DIAGNOSIS — O219 Vomiting of pregnancy, unspecified: Secondary | ICD-10-CM

## 2016-06-22 MED ORDER — ONDANSETRON 4 MG PO TBDP: 4.0000 mg | ORAL_TABLET | Freq: Four times a day (QID) | ORAL | 1 refills | Status: DC | PRN

## 2016-06-22 NOTE — Progress Notes (Signed)
Pt states N&V no better, has been taking Unisom and B6.

## 2016-06-22 NOTE — Progress Notes (Signed)
Subjective:  Katie Pope is a 36 y.o. G2P1001 at 1932w2d being seen today for ongoing prenatal care.  She is currently monitored for the following issues for this low-risk pregnancy and has Supervision of normal pregnancy; AMA (advanced maternal age) multigravida 35+; and Nausea and vomiting during pregnancy on her problem list.  Patient reports nausea.  Contractions: Not present. Vag. Bleeding: None.  Movement: Present. Denies leaking of fluid.   The following portions of the patient's history were reviewed and updated as appropriate: allergies, current medications, past family history, past medical history, past social history, past surgical history and problem list. Problem list updated.  Objective:   Vitals:   06/22/16 0856  BP: 113/74  Pulse: 91  Weight: 126 lb (57.2 kg)    Fetal Status:     Movement: Present     General:  Alert, oriented and cooperative. Patient is in no acute distress.  Skin: Skin is warm and dry. No rash noted.   Cardiovascular: Normal heart rate noted  Respiratory: Normal respiratory effort, no problems with respiration noted  Abdomen: Soft, gravid, appropriate for gestational age. Pain/Pressure: Absent     Pelvic:  Cervical exam deferred        Extremities: Normal range of motion.     Mental Status: Normal mood and affect. Normal behavior. Normal judgment and thought content.   Urinalysis: Urine Protein: Negative Urine Glucose: Negative  Assessment and Plan:  Pregnancy: G2P1001 at 8232w2d  1. Elderly multigravida in second trimester Mat 21 negative Anatomy U/S normal  2. Supervision of normal first pregnancy, antepartum   3. Nausea and vomiting during pregnancy  - ondansetron (ZOFRAN ODT) 4 MG disintegrating tablet; Take 1 tablet (4 mg total) by mouth every 6 (six) hours as needed for nausea.  Dispense: 20 tablet; Refill: 1  Preterm labor symptoms and general obstetric precautions including but not limited to vaginal bleeding, contractions,  leaking of fluid and fetal movement were reviewed in detail with the patient. Please refer to After Visit Summary for other counseling recommendations.  Return in about 4 weeks (around 07/20/2016) for OB visit.   Hermina StaggersMichael L Cynia Abruzzo, MD

## 2016-06-22 NOTE — Patient Instructions (Signed)

## 2016-07-03 ENCOUNTER — Telehealth: Payer: Self-pay | Admitting: *Deleted

## 2016-07-03 ENCOUNTER — Other Ambulatory Visit: Payer: Self-pay | Admitting: Obstetrics

## 2016-07-03 DIAGNOSIS — O99340 Other mental disorders complicating pregnancy, unspecified trimester: Principal | ICD-10-CM

## 2016-07-03 DIAGNOSIS — F329 Major depressive disorder, single episode, unspecified: Secondary | ICD-10-CM

## 2016-07-03 DIAGNOSIS — F32A Depression, unspecified: Secondary | ICD-10-CM

## 2016-07-03 NOTE — Telephone Encounter (Signed)
Patient states she has been putting it off- but feels that she is dealing with depression and would like to speak to some one. She would like to come on Wednesday if possible- [facility] could [facility]. Told patient that we would be calling her back with an appointment time.

## 2016-07-05 ENCOUNTER — Ambulatory Visit (INDEPENDENT_AMBULATORY_CARE_PROVIDER_SITE_OTHER): Payer: Medicaid Other | Admitting: Clinical

## 2016-07-05 DIAGNOSIS — F341 Dysthymic disorder: Secondary | ICD-10-CM

## 2016-07-05 NOTE — BH Specialist Note (Signed)
Integrated Behavioral Health Initial Visit  MRN: [id number] Name: Katie SleightJennifer D Pope   Session Start time: 3:00 Session End time: 3:30 Total time: 30 minutes  Type of Service: Integrated Behavioral Health- Individual/Family Interpretor:No. Interpretor Name and Language: n/a   Warm Hand Off Completed.       SUBJECTIVE: Katie SleightJennifer D Pope is a 36 y.o. female accompanied by patient. Patient was referred by  Dr Alysia PennaErvin for Depression, anxiety. Patient reports the following symptoms/concerns: Pt states that she wants to talk about how she is feeling today, and is open to learning strategies to cope with feelings of depression and anxiety, including negative self-talk. Duration of problem: "Many years"; Severity of problem: moderate  OBJECTIVE: Mood: Depressed and Affect: Depressed Risk of harm to self or others: No plan to harm self or others   LIFE CONTEXT: Family and Social: Lives with daughter and FOB School/Work: n/a Self-Care: - Life Changes: Current pregnancy  GOALS ADDRESSED: Patient will reduce symptoms of: anxiety and depression and increase knowledge and/or ability of: self-management skills and also: Increase healthy adjustment to current life circumstances   INTERVENTIONS: Motivational Interviewing and Mindfulness or Relaxation Training  Standardized Assessments completed: GAD-7 and PHQ 9  ASSESSMENT: Patient currently experiencing Dysthymia . Patient may benefit from psychoeducation, brief therapeutic interventions and referral to ongoing therapy.  PLAN: 1. Follow up with behavioral health clinician on : Two weeks 2. Behavioral recommendations:  -Consider [facility] to establish care for ongoing therapy, as needed -Consider CALM relaxation breathing exercises and apps daily for self-coping strategies -Read educational material regarding coping with symptoms of anxiety and depression  3. Referral(s): [facility] and [facility] 4. "From scale of 1-10, how likely are you to follow plan?": 8  Khyler Eschmann C Jazalyn Mondor, LCSWA

## 2016-07-20 ENCOUNTER — Ambulatory Visit (INDEPENDENT_AMBULATORY_CARE_PROVIDER_SITE_OTHER): Payer: Medicaid Other | Admitting: Obstetrics & Gynecology

## 2016-07-20 VITALS — BP 115/77 | HR 74 | Wt 127.0 lb

## 2016-07-20 DIAGNOSIS — Z3482 Encounter for supervision of other normal pregnancy, second trimester: Secondary | ICD-10-CM

## 2016-07-20 DIAGNOSIS — Z3480 Encounter for supervision of other normal pregnancy, unspecified trimester: Secondary | ICD-10-CM

## 2016-07-20 DIAGNOSIS — O219 Vomiting of pregnancy, unspecified: Secondary | ICD-10-CM

## 2016-07-20 MED ORDER — PANTOPRAZOLE SODIUM 40 MG PO TBEC: 40.0000 mg | DELAYED_RELEASE_TABLET | Freq: Every day | ORAL | 1 refills | Status: DC

## 2016-07-20 NOTE — Progress Notes (Signed)
   PRENATAL VISIT NOTE  Subjective:  Katie SleightJennifer D Follett is a 36 y.o. G2P1001 at 7495w2d being seen today for ongoing prenatal care.  She is currently monitored for the following issues for this low-risk pregnancy and has Supervision of normal pregnancy; AMA (advanced maternal age) multigravida 35+; and Nausea and vomiting during pregnancy on her problem list.  Patient reports heartburn and nausea.  Contractions: Not present. Vag. Bleeding: None.  Movement: Present. Denies leaking of fluid.   The following portions of the patient's history were reviewed and updated as appropriate: allergies, current medications, past family history, past medical history, past social history, past surgical history and problem list. Problem list updated.  Objective:   Vitals:   07/20/16 0839  BP: 115/77  Pulse: 74  Weight: 127 lb (57.6 kg)    Fetal Status:     Movement: Present     General:  Alert, oriented and cooperative. Patient is in no acute distress.  Skin: Skin is warm and dry. No rash noted.   Cardiovascular: Normal heart rate noted  Respiratory: Normal respiratory effort, no problems with respiration noted  Abdomen: Soft, gravid, appropriate for gestational age. Pain/Pressure: Absent     Pelvic:  Cervical exam deferred        Extremities: Normal range of motion.  Edema: None  Mental Status: Normal mood and affect. Normal behavior. Normal judgment and thought content.   Assessment and Plan:  Pregnancy: G2P1001 at 5995w2d  1. Supervision of other normal pregnancy, antepartum Nl US  2. Nausea and vomiting during pregnancy  - pantoprazole (PROTONIX) 40 MG tablet; Take 1 tablet (40 mg total) by mouth daily.  Dispense: 30 tablet; Refill: 1  Preterm labor symptoms and general obstetric precautions including but not limited to vaginal bleeding, contractions, leaking of fluid and fetal movement were reviewed in detail with the patient. Please refer to After Visit Summary for other counseling  recommendations.  Return in about 4 weeks (around 08/17/2016).   Adam PhenixJames G Xitlalli Newhard, MD

## 2016-07-20 NOTE — Patient Instructions (Signed)

## 2016-07-27 ENCOUNTER — Encounter: Payer: Self-pay | Admitting: Obstetrics & Gynecology

## 2016-07-27 ENCOUNTER — Telehealth: Payer: Self-pay | Admitting: *Deleted

## 2016-07-27 NOTE — Telephone Encounter (Signed)
Pt dental office called in to inquire if any restrictions for dental care due to pregnancy.  Return call to office. LM making office aware there are no restrictions for dental care, may use routine meds per Dr Clearance Coots. Advised to call office if further info needed.

## 2016-08-04 ENCOUNTER — Encounter (HOSPITAL_COMMUNITY): Payer: Self-pay | Admitting: *Deleted

## 2016-08-04 ENCOUNTER — Telehealth: Payer: Self-pay | Admitting: *Deleted

## 2016-08-04 DIAGNOSIS — J45909 Unspecified asthma, uncomplicated: Secondary | ICD-10-CM | POA: Insufficient documentation

## 2016-08-04 DIAGNOSIS — Z79899 Other long term (current) drug therapy: Secondary | ICD-10-CM | POA: Insufficient documentation

## 2016-08-04 DIAGNOSIS — Z3A26 26 weeks gestation of pregnancy: Secondary | ICD-10-CM | POA: Diagnosis not present

## 2016-08-04 DIAGNOSIS — Z87891 Personal history of nicotine dependence: Secondary | ICD-10-CM | POA: Insufficient documentation

## 2016-08-04 DIAGNOSIS — O2342 Unspecified infection of urinary tract in pregnancy, second trimester: Secondary | ICD-10-CM | POA: Insufficient documentation

## 2016-08-04 DIAGNOSIS — O26892 Other specified pregnancy related conditions, second trimester: Secondary | ICD-10-CM | POA: Diagnosis present

## 2016-08-04 LAB — COMPREHENSIVE METABOLIC PANEL
ALK PHOS: 60 U/L (ref 38–126)
ALT: 21 U/L (ref 14–54)
ANION GAP: 8 (ref 5–15)
AST: 26 U/L (ref 15–41)
Albumin: 3.1 g/dL — ABNORMAL LOW (ref 3.5–5.0)
BILIRUBIN TOTAL: 0.6 mg/dL (ref 0.3–1.2)
BUN: 5 mg/dL — ABNORMAL LOW (ref 6–20)
CALCIUM: 9.1 mg/dL (ref 8.9–10.3)
CO2: 20 mmol/L — ABNORMAL LOW (ref 22–32)
Chloride: 107 mmol/L (ref 101–111)
Creatinine, Ser: 0.79 mg/dL (ref 0.44–1.00)
GLUCOSE: 96 mg/dL (ref 65–99)
POTASSIUM: 3.6 mmol/L (ref 3.5–5.1)
Sodium: 135 mmol/L (ref 135–145)
TOTAL PROTEIN: 6.5 g/dL (ref 6.5–8.1)

## 2016-08-04 LAB — URINALYSIS, ROUTINE W REFLEX MICROSCOPIC
Bacteria, UA: NONE SEEN
Bilirubin Urine: NEGATIVE
GLUCOSE, UA: NEGATIVE mg/dL
Hgb urine dipstick: NEGATIVE
KETONES UR: 80 mg/dL — AB
NITRITE: NEGATIVE
PH: 5 (ref 5.0–8.0)
Protein, ur: 30 mg/dL — AB
SPECIFIC GRAVITY, URINE: 1.02 (ref 1.005–1.030)

## 2016-08-04 LAB — POC URINE PREG, ED: Preg Test, Ur: POSITIVE — AB

## 2016-08-04 LAB — CBC
HCT: 34.6 % — ABNORMAL LOW (ref 36.0–46.0)
HEMOGLOBIN: 12 g/dL (ref 12.0–15.0)
MCH: 31 pg (ref 26.0–34.0)
MCHC: 34.7 g/dL (ref 30.0–36.0)
MCV: 89.4 fL (ref 78.0–100.0)
PLATELETS: 190 10*3/uL (ref 150–400)
RBC: 3.87 MIL/uL (ref 3.87–5.11)
RDW: 13 % (ref 11.5–15.5)
WBC: 12.2 10*3/uL — AB (ref 4.0–10.5)

## 2016-08-04 NOTE — Telephone Encounter (Signed)
Pt called to office asking for return call.  Return call to pt.  Pt states that she has been severely constipated. Pt states that she has taken Miralax, drank pune juice.  Pt states this did help with constipation but states now she is having frequent diarrhea over the past couple days. Pt state she spoke with an after hours nurse and was recommended Imodium. Pt was made aware that Imodium is fine to take in pregnancy.  Pt was advised to continue Imodium as needed, Bland/Brat diet, and push PO fluids- Per RDenney.  Pt advised that if symptoms are no better by Monday to call office or if she cannot tolerate diet/fluids to be seen at [facility] for eval.  Pt states understanding.

## 2016-08-04 NOTE — ED Triage Notes (Addendum)
Pt c/o diarrhea and stomach cramping since Tuesday. Has taken imodium x 6 and zofran x 4 without relief. Pt is 6.5 months pregnant. Reports feeling fatigued. Pt has been having to change her kittens litter pan and is concerned this could have caused diarrhea

## 2016-08-05 ENCOUNTER — Emergency Department (HOSPITAL_COMMUNITY)
Admission: EM | Admit: 2016-08-05 | Discharge: 2016-08-05 | Disposition: A | Discharge status: Home / Self Care | Payer: Medicaid Other | Attending: Emergency Medicine | Admitting: Emergency Medicine

## 2016-08-05 DIAGNOSIS — R197 Diarrhea, unspecified: Secondary | ICD-10-CM

## 2016-08-05 DIAGNOSIS — O2342 Unspecified infection of urinary tract in pregnancy, second trimester: Secondary | ICD-10-CM

## 2016-08-05 LAB — URINALYSIS, ROUTINE W REFLEX MICROSCOPIC
GLUCOSE, UA: NEGATIVE mg/dL
Hgb urine dipstick: NEGATIVE
KETONES UR: 80 mg/dL — AB
Nitrite: NEGATIVE
PH: 5 (ref 5.0–8.0)
Protein, ur: 100 mg/dL — AB
Specific Gravity, Urine: 1.027 (ref 1.005–1.030)

## 2016-08-05 LAB — GASTROINTESTINAL PANEL BY PCR, STOOL (REPLACES STOOL CULTURE)
Adenovirus F40/41: NOT DETECTED
Astrovirus: NOT DETECTED
CRYPTOSPORIDIUM: NOT DETECTED
Campylobacter species: NOT DETECTED
Cyclospora cayetanensis: NOT DETECTED
ENTAMOEBA HISTOLYTICA: NOT DETECTED
Enteroaggregative E coli (EAEC): NOT DETECTED
Enteropathogenic E coli (EPEC): NOT DETECTED
Enterotoxigenic E coli (ETEC): NOT DETECTED
GIARDIA LAMBLIA: NOT DETECTED
NOROVIRUS GI/GII: NOT DETECTED
Plesimonas shigelloides: NOT DETECTED
ROTAVIRUS A: NOT DETECTED
SALMONELLA SPECIES: NOT DETECTED
SHIGA LIKE TOXIN PRODUCING E COLI (STEC): NOT DETECTED
SHIGELLA/ENTEROINVASIVE E COLI (EIEC): NOT DETECTED
Sapovirus (I, II, IV, and V): NOT DETECTED
VIBRIO CHOLERAE: NOT DETECTED
Vibrio species: NOT DETECTED
YERSINIA ENTEROCOLITICA: NOT DETECTED

## 2016-08-05 LAB — C DIFFICILE QUICK SCREEN W PCR REFLEX
C DIFFICILE (CDIFF) TOXIN: NEGATIVE
C DIFFICLE (CDIFF) ANTIGEN: NEGATIVE
C Diff interpretation: NOT DETECTED

## 2016-08-05 MED ORDER — AMOXICILLIN-POT CLAVULANATE 500-125 MG PO TABS: 1.0000 | ORAL_TABLET | Freq: Two times a day (BID) | ORAL | 0 refills | Status: AC

## 2016-08-05 MED ORDER — SODIUM CHLORIDE 0.9 % IV BOLUS (SEPSIS)
1000.0000 mL | Freq: Once | INTRAVENOUS | Status: AC
  Administered 2016-08-05: 1000 mL via INTRAVENOUS

## 2016-08-05 MED ORDER — AMOXICILLIN-POT CLAVULANATE 500-125 MG PO TABS
1.0000 | ORAL_TABLET | Freq: Once | ORAL | Status: AC
  Administered 2016-08-05: 500 mg via ORAL
  Filled 2016-08-05: qty 1

## 2016-08-05 NOTE — ED Notes (Signed)
Called Main lab and spoke to [facility] Requesting that they run the cultures on the second urine specimen that was sent down

## 2016-08-05 NOTE — ED Provider Notes (Signed)
[facility]-EMERGENCY DEPT Provider Note   CSN: [id number] Arrival date & time: 08/04/16  2122   By signing my name below, I, Katie Pope, attest that this documentation has been prepared under the direction and in the presence of Pricilla Loveless, MD. Electronically Signed: Soijett Pope, ED Scribe. 08/05/16. 1:02 AM.  History   Chief Complaint Chief Complaint  Patient presents with  . Diarrhea    HPI Katie Pope is a G71P1 36 y.o. female who presents to the Emergency Department complaining of watery diarrhea every 30 minutes to 1 hour onset 4 days ago. Pt reports associated resolved constipation x 4 days ago, intermittent lower and upper abdominal pain worsened prior to bowel movements, nausea, vomiting, mild dysuria, and decreased urine output. A couple times she has seen blood on wiping but never in the stool or toilet. Pt has tried imodium x 4 doses and zofran x 4 doses and BRAT diet, with no relief of her symptoms. Pt reports that she is currently 6.5 months pregnant and her OB care is at [facility]. She states that she was evaluated by her OB for constipation last week and informed to use miralax that she stopped taking last week prior to the onset of her symptoms. She denies decreased fetal movement, vaginal bleeding, loss of vaginal fluid, fever, and any other symptoms. Denies recent antibiotic use or PMHx of C. Diff.     The history is provided by the patient. No language interpreter was used.    Past Medical History:  Diagnosis Date  . Asthma   . Bronchitis   . Scoliosis     Patient Active Problem List   Diagnosis Date Noted  . Nausea and vomiting during pregnancy 06/22/2016  . AMA (advanced maternal age) multigravida 35+ 05/11/2016  . Supervision of normal pregnancy 04/13/2016    Past Surgical History:  Procedure Laterality Date  . NO PAST SURGERIES      OB History    Gravida Para Term Preterm AB Living   SAB TAB Ectopic Multiple Live Births                   Home Medications    Prior to Admission medications   Medication Sig Start Date End Date Taking? Authorizing Provider  amoxicillin-clavulanate (AUGMENTIN) 500-125 MG tablet Take 1 tablet (500 mg total) by mouth 2 (two) times daily. 08/05/16 08/10/16  Pricilla Loveless, MD  doxylamine, Sleep, (UNISOM) 25 MG tablet Take 25 mg by mouth at bedtime as needed.    Historical Provider, MD  ondansetron (ZOFRAN ODT) 4 MG disintegrating tablet Take 1 tablet (4 mg total) by mouth every 6 (six) hours as needed for nausea. 06/22/16   Hermina Staggers, MD  pantoprazole (PROTONIX) 40 MG tablet Take 1 tablet (40 mg total) by mouth daily. 07/20/16 07/20/17  Adam Phenix, MD  Prenat-FeFmCb-DSS-FA-DHA w/o A (CITRANATAL HARMONY) 27-1-260 MG CAPS Take 1 capsule by mouth daily before breakfast. 04/14/16   Brock Bad, MD  Pyridoxine HCl (B-6 PO) Take by mouth.    Historical Provider, MD    Family History Family History  Problem Relation Age of Onset  . Diabetes Mother     Social History Social History  Substance Use Topics  . Smoking status: Former Smoker    Quit date: 12/23/2011  . Smokeless tobacco: Never Used  . Alcohol use No     Comment: occ     Allergies  Rocephin [ceftriaxone sodium in dextrose]   Review of Systems Review of Systems  Constitutional: Negative for fever.  Gastrointestinal: Positive for abdominal pain (lower and upper prior to bowel movement), constipation (resolved) and diarrhea.  Genitourinary: Positive for decreased urine volume and dysuria. Negative for vaginal bleeding and vaginal discharge.  All other systems reviewed and are negative.    Physical Exam Updated Vital Signs BP 97/71 (BP Location: Right Arm)   Pulse 65   Temp 98.2 F (36.8 C) (Oral)   Resp 15   LMP 03/03/2016   SpO2 100%   Physical Exam  Constitutional: She is oriented to person, place, and time. She appears well-developed and well-nourished.  HENT:  Head: Normocephalic and  atraumatic.  Right Ear: External ear normal.  Left Ear: External ear normal.  Nose: Nose normal.  Mouth/Throat: Mucous membranes are dry.  Eyes: Right eye exhibits no discharge. Left eye exhibits no discharge.  Cardiovascular: Normal rate, regular rhythm and normal heart sounds.   Pulmonary/Chest: Effort normal and breath sounds normal.  Abdominal: Soft. There is tenderness.  Gravid, mild lower abdominal tenderness.   Neurological: She is alert and oriented to person, place, and time.  Skin: Skin is warm and dry.  Nursing note and vitals reviewed.    ED Treatments / Results  DIAGNOSTIC STUDIES: Oxygen Saturation is 100% on RA, nl by my interpretation.    COORDINATION OF CARE: 1:01 AM Discussed treatment plan with pt at bedside which includes labs, UA, and pt agreed to plan.   Labs (all labs ordered are listed, but only abnormal results are displayed) Labs Reviewed  COMPREHENSIVE METABOLIC PANEL - Abnormal; Notable for the following:       Result Value   CO2 20 (*)    BUN 5 (*)    Albumin 3.1 (*)    All other components within normal limits  CBC - Abnormal; Notable for the following:    WBC 12.2 (*)    HCT 34.6 (*)    All other components within normal limits  URINALYSIS, ROUTINE W REFLEX MICROSCOPIC - Abnormal; Notable for the following:    Color, Urine AMBER (*)    APPearance CLOUDY (*)    Ketones, ur 80 (*)    Protein, ur 30 (*)    Leukocytes, UA LARGE (*)    Squamous Epithelial / LPF TOO NUMEROUS TO COUNT (*)    All other components within normal limits  URINALYSIS, ROUTINE W REFLEX MICROSCOPIC - Abnormal; Notable for the following:    Color, Urine AMBER (*)    APPearance CLOUDY (*)    Bilirubin Urine SMALL (*)    Ketones, ur 80 (*)    Protein, ur 100 (*)    Leukocytes, UA LARGE (*)    Bacteria, UA RARE (*)    Squamous Epithelial / LPF 0-5 (*)    All other components within normal limits  POC URINE PREG, ED - Abnormal; Notable for the following:    Preg  Test, Ur POSITIVE (*)    All other components within normal limits  C DIFFICILE QUICK SCREEN W PCR REFLEX  GASTROINTESTINAL PANEL BY PCR, STOOL (REPLACES STOOL CULTURE)  URINE CULTURE    EKG  EKG Interpretation None       Radiology No results found.  Procedures Procedures (including critical care time)  Medications Ordered in ED Medications  sodium chloride 0.9 % bolus 1,000 mL (0 mLs Intravenous Stopped 08/05/16 0439)  sodium chloride 0.9 % bolus 1,000 mL (0 mLs Intravenous Stopped 08/05/16  0981)  amoxicillin-clavulanate (AUGMENTIN) 500-125 MG per tablet 500 mg (500 mg Oral Given 08/05/16 0453)     Initial Impression / Assessment and Plan / ED Course  I have reviewed the triage vital signs and the nursing notes.  Pertinent labs & imaging results that were available during my care of the patient were reviewed by me and considered in my medical decision making (see chart for details).     Patient feels better after IV fluids. Cleared by OB from pregnancy standpoint. Urine symptoms c/w UTI vs dehydration causing some difficulty urinating and discomfort. Given she's pregnant, will treat as UTI. Cephalosporin allergy but has had amoxicillin/PCN before. Will treat with augmentin. Overall abd pain improving. Doubt acute intra-abd emergency. Diarrhea likely combination of infectious plus her trying to treat her constipation. Given volume of stools per her, C diff ordered and is negative. BP low (90s, now up in low 100s). No symptoms currently. Appears much better. D/c with oral fluids, augmentin, close f/u with OB. Discussed strict return precautions.  Final Clinical Impressions(s) / ED Diagnoses   Final diagnoses:  Diarrhea of presumed infectious origin  Urinary tract infection in mother during second trimester of pregnancy    New Prescriptions New Prescriptions   AMOXICILLIN-CLAVULANATE (AUGMENTIN) 500-125 MG TABLET    Take 1 tablet (500 mg total) by mouth 2 (two) times daily.    I personally performed the services described in this documentation, which was scribed in my presence. The recorded information has been reviewed and is accurate.    Pricilla Loveless, MD 08/05/16 [phone]

## 2016-08-05 NOTE — ED Notes (Addendum)
Called OB rapid Response, will be here shortly

## 2016-08-05 NOTE — Progress Notes (Signed)
Updated Dr. Emelda Fear on patient, EFM assessment. Irritability noted.  PT OB cleared, ER Provider notified. Pt reports no discomfort with irritability.

## 2016-08-05 NOTE — Progress Notes (Signed)
Called to [facility] ER to see patient G2P1, 26weeks 4 days with N/V and diahrrea with worsening symptoms today.  Pt reports no vaginal bleeding , no LOF, no contractions.  Patient placed on EFM and assessing. IV being started. ER Provider in to see patient.

## 2016-08-06 LAB — URINE CULTURE: Culture: NO GROWTH

## 2016-08-17 ENCOUNTER — Ambulatory Visit (INDEPENDENT_AMBULATORY_CARE_PROVIDER_SITE_OTHER): Payer: Medicaid Other | Admitting: Obstetrics and Gynecology

## 2016-08-17 ENCOUNTER — Other Ambulatory Visit: Payer: Medicaid Other

## 2016-08-17 VITALS — BP 103/71 | HR 72 | Wt 129.0 lb

## 2016-08-17 DIAGNOSIS — Z3480 Encounter for supervision of other normal pregnancy, unspecified trimester: Secondary | ICD-10-CM

## 2016-08-17 DIAGNOSIS — O219 Vomiting of pregnancy, unspecified: Secondary | ICD-10-CM

## 2016-08-17 DIAGNOSIS — O09522 Supervision of elderly multigravida, second trimester: Secondary | ICD-10-CM

## 2016-08-17 DIAGNOSIS — Z23 Encounter for immunization: Secondary | ICD-10-CM

## 2016-08-17 MED ORDER — ONDANSETRON 4 MG PO TBDP: 4.0000 mg | ORAL_TABLET | Freq: Four times a day (QID) | ORAL | 1 refills | Status: DC | PRN

## 2016-08-17 NOTE — Patient Instructions (Signed)
Third Trimester of Pregnancy The third trimester is from week 28 through week 40 (months 7 through 9). The third trimester is a time when the unborn baby (fetus) is growing rapidly. At the end of the ninth month, the fetus is about 20 inches in length and weighs 6-10 pounds. Body changes during your third trimester Your body will continue to go through many changes during pregnancy. The changes vary from woman to woman. During the third trimester:  Your weight will continue to increase. You can expect to gain 25-35 pounds (11-16 kg) by the end of the pregnancy.  You may begin to get stretch marks on your hips, abdomen, and breasts.  You may urinate more often because the fetus is moving lower into your pelvis and pressing on your bladder.  You may develop or continue to have heartburn. This is caused by increased hormones that slow down muscles in the digestive tract.  You may develop or continue to have constipation because increased hormones slow digestion and cause the muscles that push waste through your intestines to relax.  You may develop hemorrhoids. These are swollen veins (varicose veins) in the rectum that can itch or be painful.  You may develop swollen, bulging veins (varicose veins) in your legs.  You may have increased body aches in the pelvis, back, or thighs. This is due to weight gain and increased hormones that are relaxing your joints.  You may have changes in your hair. These can include thickening of your hair, rapid growth, and changes in texture. Some women also have hair loss during or after pregnancy, or hair that feels dry or thin. Your hair will most likely return to normal after your baby is born.  Your breasts will continue to grow and they will continue to become tender. A yellow fluid (colostrum) may leak from your breasts. This is the first milk you are producing for your baby.  Your belly button may stick out.  You may notice more swelling in your hands,  face, or ankles.  You may have increased tingling or numbness in your hands, arms, and legs. The skin on your belly may also feel numb.  You may feel short of breath because of your expanding uterus.  You may have more problems sleeping. This can be caused by the size of your belly, increased need to urinate, and an increase in your body's metabolism.  You may notice the fetus "dropping," or moving lower in your abdomen (lightening).  You may have increased vaginal discharge.  You may notice your joints feel loose and you may have pain around your pelvic bone.  What to expect at prenatal visits You will have prenatal exams every 2 weeks until week 36. Then you will have weekly prenatal exams. During a routine prenatal visit:  You will be weighed to make sure you and the baby are growing normally.  Your blood pressure will be taken.  Your abdomen will be measured to track your baby's growth.  The fetal heartbeat will be listened to.  Any test results from the previous visit will be discussed.  You may have a cervical check near your due date to see if your cervix has softened or thinned (effaced).  You will be tested for Group B streptococcus. This happens between 35 and 37 weeks.  Your health care provider may ask you:  What your birth plan is.  How you are feeling.  If you are feeling the baby move.  If you have had   any abnormal symptoms, such as leaking fluid, bleeding, severe headaches, or abdominal cramping.  If you are using any tobacco products, including cigarettes, chewing tobacco, and electronic cigarettes.  If you have any questions.  Other tests or screenings that may be performed during your third trimester include:  Blood tests that check for low iron levels (anemia).  Fetal testing to check the health, activity level, and growth of the fetus. Testing is done if you have certain medical conditions or if there are problems during the  pregnancy.  Nonstress test (NST). This test checks the health of your baby to make sure there are no signs of problems, such as the baby not getting enough oxygen. During this test, a belt is placed around your belly. The baby is made to move, and its heart rate is monitored during movement.  What is false labor? False labor is a condition in which you feel small, irregular tightenings of the muscles in the womb (contractions) that usually go away with rest, changing position, or drinking water. These are called Braxton Hicks contractions. Contractions may last for hours, days, or even weeks before true labor sets in. If contractions come at regular intervals, become more frequent, increase in intensity, or become painful, you should see your health care provider. What are the signs of labor?  Abdominal cramps.  Regular contractions that start at 10 minutes apart and become stronger and more frequent with time.  Contractions that start on the top of the uterus and spread down to the lower abdomen and back.  Increased pelvic pressure and dull back pain.  A watery or bloody mucus discharge that comes from the vagina.  Leaking of amniotic fluid. This is also known as your "water breaking." It could be a slow trickle or a gush. Let your health care provider know if it has a color or strange odor. If you have any of these signs, call your health care provider right away, even if it is before your due date. Follow these instructions at home: Medicines  Follow your health care provider's instructions regarding medicine use. Specific medicines may be either safe or unsafe to take during pregnancy.  Take a prenatal vitamin that contains at least 600 micrograms (mcg) of folic acid.  If you develop constipation, try taking a stool softener if your health care provider approves. Eating and drinking  Eat a balanced diet that includes fresh fruits and vegetables, whole grains, good sources of protein  such as meat, eggs, or tofu, and low-fat dairy. Your health care provider will help you determine the amount of weight gain that is right for you.  Avoid raw meat and uncooked cheese. These carry germs that can cause birth defects in the baby.  If you have low calcium intake from food, talk to your health care provider about whether you should take a daily calcium supplement.  Eat four or five small meals rather than three large meals a day.  Limit foods that are high in fat and processed sugars, such as fried and sweet foods.  To prevent constipation: ? Drink enough fluid to keep your urine clear or pale yellow. ? Eat foods that are high in fiber, such as fresh fruits and vegetables, whole grains, and beans. Activity  Exercise only as directed by your health care provider. Most women can continue their usual exercise routine during pregnancy. Try to exercise for 30 minutes at least 5 days a week. Stop exercising if you experience uterine contractions.  Avoid heavy   lifting.  Do not exercise in extreme heat or humidity, or at high altitudes.  Wear low-heel, comfortable shoes.  Practice good posture.  You may continue to have sex unless your health care provider tells you otherwise. Relieving pain and discomfort  Take frequent breaks and rest with your legs elevated if you have leg cramps or low back pain.  Take warm sitz baths to soothe any pain or discomfort caused by hemorrhoids. Use hemorrhoid cream if your health care provider approves.  Wear a good support bra to prevent discomfort from breast tenderness.  If you develop varicose veins: ? Wear support pantyhose or compression stockings as told by your healthcare provider. ? Elevate your feet for 15 minutes, 3-4 times a day. Prenatal care  Write down your questions. Take them to your prenatal visits.  Keep all your prenatal visits as told by your health care provider. This is important. Safety  Wear your seat belt at  all times when driving.  Make a list of emergency phone numbers, including numbers for family, friends, the hospital, and police and fire departments. General instructions  Avoid cat litter boxes and soil used by cats. These carry germs that can cause birth defects in the baby. If you have a cat, ask someone to clean the litter box for you.  Do not travel far distances unless it is absolutely necessary and only with the approval of your health care provider.  Do not use hot tubs, steam rooms, or saunas.  Do not drink alcohol.  Do not use any products that contain nicotine or tobacco, such as cigarettes and e-cigarettes. If you need help quitting, ask your health care provider.  Do not use any medicinal herbs or unprescribed drugs. These chemicals affect the formation and growth of the baby.  Do not douche or use tampons or scented sanitary pads.  Do not cross your legs for long periods of time.  To prepare for the arrival of your baby: ? Take prenatal classes to understand, practice, and ask questions about labor and delivery. ? Make a trial run to the hospital. ? Visit the hospital and tour the maternity area. ? Arrange for maternity or paternity leave through employers. ? Arrange for family and friends to take care of pets while you are in the hospital. ? Purchase a rear-facing car seat and make sure you know how to install it in your car. ? Pack your hospital bag. ? Prepare the baby's nursery. Make sure to remove all pillows and stuffed animals from the baby's crib to prevent suffocation.  Visit your dentist if you have not gone during your pregnancy. Use a soft toothbrush to brush your teeth and be gentle when you floss. Contact a health care provider if:  You are unsure if you are in labor or if your water has broken.  You become dizzy.  You have mild pelvic cramps, pelvic pressure, or nagging pain in your abdominal area.  You have lower back pain.  You have persistent  nausea, vomiting, or diarrhea.  You have an unusual or bad smelling vaginal discharge.  You have pain when you urinate. Get help right away if:  Your water breaks before 37 weeks.  You have regular contractions less than 5 minutes apart before 37 weeks.  You have a fever.  You are leaking fluid from your vagina.  You have spotting or bleeding from your vagina.  You have severe abdominal pain or cramping.  You have rapid weight loss or weight gain.    You have shortness of breath with chest pain.  You notice sudden or extreme swelling of your face, hands, ankles, feet, or legs.  Your baby makes fewer than 10 movements in 2 hours.  You have severe headaches that do not go away when you take medicine.  You have vision changes. Summary  The third trimester is from week 28 through week 40, months 7 through 9. The third trimester is a time when the unborn baby (fetus) is growing rapidly.  During the third trimester, your discomfort may increase as you and your baby continue to gain weight. You may have abdominal, leg, and back pain, sleeping problems, and an increased need to urinate.  During the third trimester your breasts will keep growing and they will continue to become tender. A yellow fluid (colostrum) may leak from your breasts. This is the first milk you are producing for your baby.  False labor is a condition in which you feel small, irregular tightenings of the muscles in the womb (contractions) that eventually go away. These are called Braxton Hicks contractions. Contractions may last for hours, days, or even weeks before true labor sets in.  Signs of labor can include: abdominal cramps; regular contractions that start at 10 minutes apart and become stronger and more frequent with time; watery or bloody mucus discharge that comes from the vagina; increased pelvic pressure and dull back pain; and leaking of amniotic fluid. This information is not intended to replace advice  given to you by your health care provider. Make sure you discuss any questions you have with your health care provider. Document Released: 04/04/2001 Document Revised: 09/16/2015 Document Reviewed: 06/11/2012 Elsevier Interactive Patient Education  2017 [employer]  

## 2016-08-17 NOTE — Progress Notes (Signed)
TDAP given today.

## 2016-08-17 NOTE — Progress Notes (Signed)
Subjective:  Katie Pope is a 36 y.o. G2P1001 at [redacted]w[redacted]d being seen today for ongoing prenatal care.  She is currently monitored for the following issues for this high-risk pregnancy and has Supervision of normal pregnancy; AMA (advanced maternal age) multigravida 35+; and Nausea and vomiting during pregnancy on her problem list.  Patient reports Had some problems with dehydrtion and dairreha but this is resolving..  Contractions: Not present. Vag. Bleeding: None.  Movement: Present. Denies leaking of fluid.   The following portions of the patient's history were reviewed and updated as appropriate: allergies, current medications, past family history, past medical history, past social history, past surgical history and problem list. Problem list updated.  Objective:   Vitals:   08/17/16 0830  BP: 103/71  Pulse: 72  Weight: 129 lb (58.5 kg)    Fetal Status: Fetal Heart Rate (bpm): 150   Movement: Present     General:  Alert, oriented and cooperative. Patient is in no acute distress.  Skin: Skin is warm and dry. No rash noted.   Cardiovascular: Normal heart rate noted  Respiratory: Normal respiratory effort, no problems with respiration noted  Abdomen: Soft, gravid, appropriate for gestational age. Pain/Pressure: Present     Pelvic:  Cervical exam deferred        Extremities: Normal range of motion.  Edema: None  Mental Status: Normal mood and affect. Normal behavior. Normal judgment and thought content.   Urinalysis:      Assessment and Plan:  Pregnancy: G2P1001 at [redacted]w[redacted]d  1. Supervision of other normal pregnancy, antepartum 28 week labs today - Tdap vaccine greater than or equal to 7yo IM  2. Elderly multigravida in second trimester Mat 21 negative  3. Nausea and vomiting during pregnancy  - ondansetron (ZOFRAN ODT) 4 MG disintegrating tablet; Take 1 tablet (4 mg total) by mouth every 6 (six) hours as needed for nausea.  Dispense: 20 tablet; Refill: 1  Preterm labor  symptoms and general obstetric precautions including but not limited to vaginal bleeding, contractions, leaking of fluid and fetal movement were reviewed in detail with the patient. Please refer to After Visit Summary for other counseling recommendations.  No Follow-up on file.   Hermina Staggers, MD

## 2016-08-18 LAB — GLUCOSE TOLERANCE, 2 HOURS W/ 1HR
GLUCOSE, 2 HOUR: 124 mg/dL (ref 65–152)
Glucose, 1 hour: 147 mg/dL (ref 65–179)
Glucose, Fasting: 71 mg/dL (ref 65–91)

## 2016-08-18 LAB — RPR: RPR: NONREACTIVE

## 2016-08-18 LAB — HIV ANTIBODY (ROUTINE TESTING W REFLEX): HIV SCREEN 4TH GENERATION: NONREACTIVE

## 2016-08-18 LAB — CBC
HEMOGLOBIN: 11.5 g/dL (ref 11.1–15.9)
Hematocrit: 34.7 % (ref 34.0–46.6)
MCH: 30.3 pg (ref 26.6–33.0)
MCHC: 33.1 g/dL (ref 31.5–35.7)
MCV: 91 fL (ref 79–97)
Platelets: 210 10*3/uL (ref 150–379)
RBC: 3.8 x10E6/uL (ref 3.77–5.28)
RDW: 13.1 % (ref 12.3–15.4)
WBC: 8.6 10*3/uL (ref 3.4–10.8)

## 2016-08-20 ENCOUNTER — Encounter: Payer: Self-pay | Admitting: Obstetrics

## 2016-09-10 ENCOUNTER — Encounter (HOSPITAL_COMMUNITY): Payer: Self-pay | Admitting: *Deleted

## 2016-09-10 ENCOUNTER — Ambulatory Visit (HOSPITAL_COMMUNITY)
Admission: EM | Admit: 2016-09-10 | Discharge: 2016-09-10 | Disposition: A | Discharge status: Home / Self Care | Payer: Medicaid Other | Attending: Family Medicine | Admitting: Family Medicine

## 2016-09-10 ENCOUNTER — Ambulatory Visit (INDEPENDENT_AMBULATORY_CARE_PROVIDER_SITE_OTHER): Payer: Medicaid Other

## 2016-09-10 DIAGNOSIS — W19XXXA Unspecified fall, initial encounter: Secondary | ICD-10-CM

## 2016-09-10 DIAGNOSIS — S63502A Unspecified sprain of left wrist, initial encounter: Secondary | ICD-10-CM

## 2016-09-10 DIAGNOSIS — M25532 Pain in left wrist: Secondary | ICD-10-CM

## 2016-09-10 NOTE — Discharge Instructions (Signed)
Your xray looks good. Please use Tylenol as needed for pain. See OB soon for prenatal care. Go to [facility] if you having vaginal bleeding, abdominal pain or decrease fetal movement. Inform your OB/GYN of xray today.

## 2016-09-10 NOTE — ED Provider Notes (Signed)
[facility]    CSN: [id number] Arrival date & time: 09/10/16  1921     History   Chief Complaint Chief Complaint  Patient presents with  . Wrist Injury    HPI Katie Pope is a 36 y.o. female.   The history is provided by the patient. No language interpreter was used.  Fall  This is a new problem. Episode onset: She fell 2 hours ago at home. She tripped over something on the floor while doing laudry. She caught herself with her hand streched out. Denies fall on her belly. Associated symptoms comments: Left wrist pain and swelling. Denies abdominal pain. Normal fetal movement. She [facility]. Next appointment is in 4 days.. The symptoms are aggravated by bending and twisting. Nothing relieves the symptoms. She has tried nothing for the symptoms.  She denies recent exposure to xray/radiation.  Past Medical History:  Diagnosis Date  . Asthma   . Bronchitis   . Scoliosis     Patient Active Problem List   Diagnosis Date Noted  . Nausea and vomiting during pregnancy 06/22/2016  . AMA (advanced maternal age) multigravida 35+ 05/11/2016  . Supervision of normal pregnancy 04/13/2016    Past Surgical History:  Procedure Laterality Date  . NO PAST SURGERIES      OB History    Gravida Para Term Preterm AB Living   2 1 1     1    SAB TAB Ectopic Multiple Live Births                   Home Medications    Prior to Admission medications   Medication Sig Start Date End Date Taking? Authorizing Provider  doxylamine, Sleep, (UNISOM) 25 MG tablet Take 25 mg by mouth at bedtime as needed.   Yes [provider]  ondansetron (ZOFRAN ODT) 4 MG disintegrating tablet Take 1 tablet (4 mg total) by mouth every 6 (six) hours as needed for nausea. 08/17/16  Yes Hermina Staggers, MD  pantoprazole (PROTONIX) 40 MG tablet Take 1 tablet (40 mg total) by mouth daily. 07/20/16 07/20/17 Yes Adam Phenix, MD    Prenat-FeFmCb-DSS-FA-DHA w/o A [facility] HARMONY) 27-1-260 MG CAPS Take 1 capsule by mouth daily before breakfast. 04/14/16  Yes Brock Bad, MD  Pyridoxine HCl (B-6 PO) Take by mouth.   Yes [provider]    Family History Family History  Problem Relation Age of Onset  . Diabetes Mother     Social History Social History  Substance Use Topics  . Smoking status: Former Smoker    Quit date: 12/23/2011  . Smokeless tobacco: Never Used  . Alcohol use No     Comment: occ     Allergies   Rocephin [ceftriaxone sodium in dextrose]   Review of Systems Review of Systems  Respiratory: Negative.   Cardiovascular: Negative.   Gastrointestinal: Negative.   Musculoskeletal:       Left wrist pain  All other systems reviewed and are negative. No right wrist pain.  Physical Exam Triage Vital Signs ED Triage Vitals [09/10/16 2012]  Enc Vitals Group     BP 112/67     Pulse Rate 93     Resp 16     Temp 98.5 F (36.9 C)     Temp Source Oral     SpO2 100 %     Weight      Height  Head Circumference      Peak Flow      Pain Score 8     Pain Loc      Pain Edu?      Excl. in GC?    No data found.   Updated Vital Signs BP 112/67   Pulse 93   Temp 98.5 F (36.9 C) (Oral)   Resp 16   LMP 03/03/2016   SpO2 100%   Visual Acuity Right Eye Distance:   Left Eye Distance:   Bilateral Distance:    Right Eye Near:   Left Eye Near:    Bilateral Near:     Physical Exam  Constitutional: She appears well-developed. No distress.  Cardiovascular: Normal rate, regular rhythm and normal heart sounds.   No murmur heard. Pulmonary/Chest: Effort normal and breath sounds normal. No respiratory distress. She has no wheezes.  Abdominal: Soft. Normal appearance and bowel sounds are normal.  No bruising. Gravid  Musculoskeletal:       Right wrist: Normal.       Left wrist: She exhibits decreased range of motion, tenderness and swelling. She exhibits no effusion,  no crepitus and no laceration.       Arms: Neurological: She is alert. She has normal strength. No cranial nerve deficit or sensory deficit.  Nursing note and vitals reviewed.    UC Treatments / Results  Labs (all labs ordered are listed, but only abnormal results are displayed) Labs Reviewed - No data to display  EKG  EKG Interpretation None       Radiology Dg Wrist Complete Left  Result Date: 09/10/2016 CLINICAL DATA:  Status post fall, with injury to the left wrist. Pain and swelling at the left wrist. Initial encounter. EXAM: LEFT WRIST - COMPLETE 3+ VIEW COMPARISON:  None. FINDINGS: There is no evidence of fracture or dislocation. The carpal rows are intact, and demonstrate normal alignment. The joint spaces are preserved. Diffuse soft tissue swelling is noted about the wrist. IMPRESSION: No evidence of fracture or dislocation. Electronically Signed   By: Roanna RaiderJeffery  Chang M.D.   On: 09/10/2016 21:20    Procedures Procedures (including critical care time)  Medications Ordered in UC Medications - No data to display   Initial Impression / Assessment and Plan / UC Course  I have reviewed the triage vital signs and the nursing notes.  Pertinent labs & imaging results that were available during my care of the patient were reviewed by me and considered in my medical decision making (see chart for details).  Clinical Course as of Sep 11 2138  Wynelle LinkSun Sep 10, 2016  2136 Left wrist sprain. Xray reviewed without fracture. Wrist brace applied. Tylenol as needed for pain  [KE]  2139 Pregnant ( 8 month) We do not have resources for OB care at the urgent care. No obvious trauma to the abdomen. Patient denies direct trauma to her stomach. She has OB appointment in 4 days, I recommended [facility] visit if she feels decrease fetal movement or abdominal pain or vaginal bleeding. She agreed with plan.  [KE]    Clinical Course User Index [KE] Doreene ElandEniola, Langdon Crosson T, MD      Final Clinical  Impressions(s) / UC Diagnoses   Final diagnoses:  None    New Prescriptions New Prescriptions   No medications on file     Doreene ElandEniola, Scherrie Seneca T, MD 09/10/16 2143

## 2016-09-10 NOTE — ED Triage Notes (Addendum)
Pt is 8 months pregnant.  Denies injuring abd in near-fall - states caught herself from falling with LUE.  States continues to have normal fetal movement.   Pt states she would like to have an XR, despite being pregnant, states her OB recently wrote a letter stating it was ok for her to get dental XRs done.

## 2016-09-10 NOTE — ED Triage Notes (Signed)
Reports falling while doing chores @ approx 1900 today.  C/o left wrist pain with swelling and faint ecchymosis.  CMS intact.

## 2016-09-12 ENCOUNTER — Other Ambulatory Visit: Payer: Self-pay | Admitting: Obstetrics & Gynecology

## 2016-09-12 DIAGNOSIS — O219 Vomiting of pregnancy, unspecified: Secondary | ICD-10-CM

## 2016-09-14 ENCOUNTER — Ambulatory Visit (INDEPENDENT_AMBULATORY_CARE_PROVIDER_SITE_OTHER): Payer: Medicaid Other | Admitting: Obstetrics & Gynecology

## 2016-09-14 DIAGNOSIS — F329 Major depressive disorder, single episode, unspecified: Secondary | ICD-10-CM

## 2016-09-14 DIAGNOSIS — O99343 Other mental disorders complicating pregnancy, third trimester: Secondary | ICD-10-CM

## 2016-09-14 DIAGNOSIS — F32A Depression, unspecified: Secondary | ICD-10-CM | POA: Insufficient documentation

## 2016-09-14 DIAGNOSIS — O219 Vomiting of pregnancy, unspecified: Secondary | ICD-10-CM

## 2016-09-14 MED ORDER — SERTRALINE HCL 50 MG PO TABS: 50.0000 mg | ORAL_TABLET | Freq: Every day | ORAL | 2 refills | Status: DC

## 2016-09-14 MED ORDER — PANTOPRAZOLE SODIUM 40 MG PO TBEC: 40.0000 mg | DELAYED_RELEASE_TABLET | Freq: Every day | ORAL | 1 refills | Status: DC

## 2016-09-14 NOTE — Progress Notes (Signed)
   PRENATAL VISIT NOTE  Subjective:  Katie SleightJennifer D Pope is a 36 y.o. G2P1001 at 3426w2d being seen today for ongoing prenatal care.  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  Patient reports depression without SI.  Contractions: Irritability. Vag. Bleeding: None.  Movement: Present. Denies leaking of fluid.   The following portions of the patient's history were reviewed and updated as appropriate: allergies, current medications, past family history, past medical history, past social history, past surgical history and problem list. Problem list updated.  Objective:   Vitals:   09/14/16 0829  BP: 101/70  Pulse: 72  Weight: 136 lb 12.8 oz (62.1 kg)    Fetal Status: Fetal Heart Rate (bpm): 145 Fundal Height: 33 cm Movement: Present     General:  Alert, oriented and cooperative. Patient is in no acute distress.  Skin: Skin is warm and dry. No rash noted.   Cardiovascular: Normal heart rate noted  Respiratory: Normal respiratory effort, no problems with respiration noted  Abdomen: Soft, gravid, appropriate for gestational age. Pain/Pressure: Present     Pelvic:  Cervical exam deferred        Extremities: Normal range of motion.  Edema: Trace  Mental Status: Normal mood and affect. Normal behavior. Normal judgment and thought content.   Assessment and Plan:  Pregnancy: G2P1001 at 6026w2d  1. Nausea and vomiting during pregnancy May try BID for breakthrough sx of reflux - pantoprazole (PROTONIX) 40 MG tablet; Take 1 tablet (40 mg total) by mouth daily.  Dispense: 30 tablet; Refill: 1  2. 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  Dispense: 30 tablet; Refill: 2  Preterm labor symptoms and general obstetric precautions including but not limited to vaginal bleeding, contractions, leaking of fluid and fetal movement were reviewed in detail with the patient. Please refer to After Visit Summary for other counseling recommendations.  Return in about 2 weeks (around 09/28/2016).   Scheryl DarterJames Becker Christopher, MD

## 2016-09-14 NOTE — Patient Instructions (Signed)
Third Trimester of Pregnancy The third trimester is from week 28 through week 40 (months 7 through 9). The third trimester is a time when the unborn baby (fetus) is growing rapidly. At the end of the ninth month, the fetus is about 20 inches in length and weighs 6-10 pounds. Body changes during your third trimester Your body will continue to go through many changes during pregnancy. The changes vary from woman to woman. During the third trimester:  Your weight will continue to increase. You can expect to gain 25-35 pounds (11-16 kg) by the end of the pregnancy.  You may begin to get stretch marks on your hips, abdomen, and breasts.  You may urinate more often because the fetus is moving lower into your pelvis and pressing on your bladder.  You may develop or continue to have heartburn. This is caused by increased hormones that slow down muscles in the digestive tract.  You may develop or continue to have constipation because increased hormones slow digestion and cause the muscles that push waste through your intestines to relax.  You may develop hemorrhoids. These are swollen veins (varicose veins) in the rectum that can itch or be painful.  You may develop swollen, bulging veins (varicose veins) in your legs.  You may have increased body aches in the pelvis, back, or thighs. This is due to weight gain and increased hormones that are relaxing your joints.  You may have changes in your hair. These can include thickening of your hair, rapid growth, and changes in texture. Some women also have hair loss during or after pregnancy, or hair that feels dry or thin. Your hair will most likely return to normal after your baby is born.  Your breasts will continue to grow and they will continue to become tender. A yellow fluid (colostrum) may leak from your breasts. This is the first milk you are producing for your baby.  Your belly button may stick out.  You may notice more swelling in your hands,  face, or ankles.  You may have increased tingling or numbness in your hands, arms, and legs. The skin on your belly may also feel numb.  You may feel short of breath because of your expanding uterus.  You may have more problems sleeping. This can be caused by the size of your belly, increased need to urinate, and an increase in your body's metabolism.  You may notice the fetus "dropping," or moving lower in your abdomen (lightening).  You may have increased vaginal discharge.  You may notice your joints feel loose and you may have pain around your pelvic bone.  What to expect at prenatal visits You will have prenatal exams every 2 weeks until week 36. Then you will have weekly prenatal exams. During a routine prenatal visit:  You will be weighed to make sure you and the baby are growing normally.  Your blood pressure will be taken.  Your abdomen will be measured to track your baby's growth.  The fetal heartbeat will be listened to.  Any test results from the previous visit will be discussed.  You may have a cervical check near your due date to see if your cervix has softened or thinned (effaced).  You will be tested for Group B streptococcus. This happens between 35 and 37 weeks.  Your health care provider may ask you:  What your birth plan is.  How you are feeling.  If you are feeling the baby move.  If you have had   any abnormal symptoms, such as leaking fluid, bleeding, severe headaches, or abdominal cramping.  If you are using any tobacco products, including cigarettes, chewing tobacco, and electronic cigarettes.  If you have any questions.  Other tests or screenings that may be performed during your third trimester include:  Blood tests that check for low iron levels (anemia).  Fetal testing to check the health, activity level, and growth of the fetus. Testing is done if you have certain medical conditions or if there are problems during the  pregnancy.  Nonstress test (NST). This test checks the health of your baby to make sure there are no signs of problems, such as the baby not getting enough oxygen. During this test, a belt is placed around your belly. The baby is made to move, and its heart rate is monitored during movement.  What is false labor? False labor is a condition in which you feel small, irregular tightenings of the muscles in the womb (contractions) that usually go away with rest, changing position, or drinking water. These are called Braxton Hicks contractions. Contractions may last for hours, days, or even weeks before true labor sets in. If contractions come at regular intervals, become more frequent, increase in intensity, or become painful, you should see your health care provider. What are the signs of labor?  Abdominal cramps.  Regular contractions that start at 10 minutes apart and become stronger and more frequent with time.  Contractions that start on the top of the uterus and spread down to the lower abdomen and back.  Increased pelvic pressure and dull back pain.  A watery or bloody mucus discharge that comes from the vagina.  Leaking of amniotic fluid. This is also known as your "water breaking." It could be a slow trickle or a gush. Let your health care provider know if it has a color or strange odor. If you have any of these signs, call your health care provider right away, even if it is before your due date. Follow these instructions at home: Medicines  Follow your health care provider's instructions regarding medicine use. Specific medicines may be either safe or unsafe to take during pregnancy.  Take a prenatal vitamin that contains at least 600 micrograms (mcg) of folic acid.  If you develop constipation, try taking a stool softener if your health care provider approves. Eating and drinking  Eat a balanced diet that includes fresh fruits and vegetables, whole grains, good sources of protein  such as meat, eggs, or tofu, and low-fat dairy. Your health care provider will help you determine the amount of weight gain that is right for you.  Avoid raw meat and uncooked cheese. These carry germs that can cause birth defects in the baby.  If you have low calcium intake from food, talk to your health care provider about whether you should take a daily calcium supplement.  Eat four or five small meals rather than three large meals a day.  Limit foods that are high in fat and processed sugars, such as fried and sweet foods.  To prevent constipation: ? Drink enough fluid to keep your urine clear or pale yellow. ? Eat foods that are high in fiber, such as fresh fruits and vegetables, whole grains, and beans. Activity  Exercise only as directed by your health care provider. Most women can continue their usual exercise routine during pregnancy. Try to exercise for 30 minutes at least 5 days a week. Stop exercising if you experience uterine contractions.  Avoid heavy   lifting.  Do not exercise in extreme heat or humidity, or at high altitudes.  Wear low-heel, comfortable shoes.  Practice good posture.  You may continue to have sex unless your health care provider tells you otherwise. Relieving pain and discomfort  Take frequent breaks and rest with your legs elevated if you have leg cramps or low back pain.  Take warm sitz baths to soothe any pain or discomfort caused by hemorrhoids. Use hemorrhoid cream if your health care provider approves.  Wear a good support bra to prevent discomfort from breast tenderness.  If you develop varicose veins: ? Wear support pantyhose or compression stockings as told by your healthcare provider. ? Elevate your feet for 15 minutes, 3-4 times a day. Prenatal care  Write down your questions. Take them to your prenatal visits.  Keep all your prenatal visits as told by your health care provider. This is important. Safety  Wear your seat belt at  all times when driving.  Make a list of emergency phone numbers, including numbers for family, friends, the hospital, and police and fire departments. General instructions  Avoid cat litter boxes and soil used by cats. These carry germs that can cause birth defects in the baby. If you have a cat, ask someone to clean the litter box for you.  Do not travel far distances unless it is absolutely necessary and only with the approval of your health care provider.  Do not use hot tubs, steam rooms, or saunas.  Do not drink alcohol.  Do not use any products that contain nicotine or tobacco, such as cigarettes and e-cigarettes. If you need help quitting, ask your health care provider.  Do not use any medicinal herbs or unprescribed drugs. These chemicals affect the formation and growth of the baby.  Do not douche or use tampons or scented sanitary pads.  Do not cross your legs for long periods of time.  To prepare for the arrival of your baby: ? Take prenatal classes to understand, practice, and ask questions about labor and delivery. ? Make a trial run to the hospital. ? Visit the hospital and tour the maternity area. ? Arrange for maternity or paternity leave through employers. ? Arrange for family and friends to take care of pets while you are in the hospital. ? Purchase a rear-facing car seat and make sure you know how to install it in your car. ? Pack your hospital bag. ? Prepare the baby's nursery. Make sure to remove all pillows and stuffed animals from the baby's crib to prevent suffocation.  Visit your dentist if you have not gone during your pregnancy. Use a soft toothbrush to brush your teeth and be gentle when you floss. Contact a health care provider if:  You are unsure if you are in labor or if your water has broken.  You become dizzy.  You have mild pelvic cramps, pelvic pressure, or nagging pain in your abdominal area.  You have lower back pain.  You have persistent  nausea, vomiting, or diarrhea.  You have an unusual or bad smelling vaginal discharge.  You have pain when you urinate. Get help right away if:  Your water breaks before 37 weeks.  You have regular contractions less than 5 minutes apart before 37 weeks.  You have a fever.  You are leaking fluid from your vagina.  You have spotting or bleeding from your vagina.  You have severe abdominal pain or cramping.  You have rapid weight loss or weight gain.    You have shortness of breath with chest pain.  You notice sudden or extreme swelling of your face, hands, ankles, feet, or legs.  Your baby makes fewer than 10 movements in 2 hours.  You have severe headaches that do not go away when you take medicine.  You have vision changes. Summary  The third trimester is from week 28 through week 40, months 7 through 9. The third trimester is a time when the unborn baby (fetus) is growing rapidly.  During the third trimester, your discomfort may increase as you and your baby continue to gain weight. You may have abdominal, leg, and back pain, sleeping problems, and an increased need to urinate.  During the third trimester your breasts will keep growing and they will continue to become tender. A yellow fluid (colostrum) may leak from your breasts. This is the first milk you are producing for your baby.  False labor is a condition in which you feel small, irregular tightenings of the muscles in the womb (contractions) that eventually go away. These are called Braxton Hicks contractions. Contractions may last for hours, days, or even weeks before true labor sets in.  Signs of labor can include: abdominal cramps; regular contractions that start at 10 minutes apart and become stronger and more frequent with time; watery or bloody mucus discharge that comes from the vagina; increased pelvic pressure and dull back pain; and leaking of amniotic fluid. This information is not intended to replace advice  given to you by your health care provider. Make sure you discuss any questions you have with your health care provider. Document Released: 04/04/2001 Document Revised: 09/16/2015 Document Reviewed: 06/11/2012 Elsevier Interactive Patient Education  2017 [employer]  

## 2016-09-16 ENCOUNTER — Encounter (HOSPITAL_COMMUNITY): Payer: Self-pay | Admitting: *Deleted

## 2016-09-16 ENCOUNTER — Inpatient Hospital Stay (HOSPITAL_COMMUNITY)
Admission: AD | Admit: 2016-09-16 | Discharge: 2016-09-17 | DRG: 781 | Disposition: A | Discharge status: Home / Self Care | Payer: Medicaid Other | Source: Ambulatory Visit | Attending: Obstetrics and Gynecology | Admitting: Obstetrics and Gynecology

## 2016-09-16 ENCOUNTER — Inpatient Hospital Stay (HOSPITAL_COMMUNITY): Payer: Medicaid Other

## 2016-09-16 DIAGNOSIS — F329 Major depressive disorder, single episode, unspecified: Secondary | ICD-10-CM | POA: Diagnosis present

## 2016-09-16 DIAGNOSIS — Z3A32 32 weeks gestation of pregnancy: Secondary | ICD-10-CM | POA: Diagnosis not present

## 2016-09-16 DIAGNOSIS — O99343 Other mental disorders complicating pregnancy, third trimester: Secondary | ICD-10-CM | POA: Diagnosis present

## 2016-09-16 DIAGNOSIS — Z3A33 33 weeks gestation of pregnancy: Secondary | ICD-10-CM | POA: Diagnosis not present

## 2016-09-16 DIAGNOSIS — O479 False labor, unspecified: Secondary | ICD-10-CM | POA: Diagnosis present

## 2016-09-16 DIAGNOSIS — O4690 Antepartum hemorrhage, unspecified, unspecified trimester: Secondary | ICD-10-CM | POA: Diagnosis present

## 2016-09-16 DIAGNOSIS — O09523 Supervision of elderly multigravida, third trimester: Secondary | ICD-10-CM

## 2016-09-16 DIAGNOSIS — O4703 False labor before 37 completed weeks of gestation, third trimester: Secondary | ICD-10-CM | POA: Diagnosis not present

## 2016-09-16 DIAGNOSIS — O4693 Antepartum hemorrhage, unspecified, third trimester: Principal | ICD-10-CM | POA: Diagnosis present

## 2016-09-16 DIAGNOSIS — N939 Abnormal uterine and vaginal bleeding, unspecified: Secondary | ICD-10-CM

## 2016-09-16 DIAGNOSIS — O09522 Supervision of elderly multigravida, second trimester: Secondary | ICD-10-CM | POA: Diagnosis not present

## 2016-09-16 DIAGNOSIS — O4700 False labor before 37 completed weeks of gestation, unspecified trimester: Secondary | ICD-10-CM | POA: Diagnosis present

## 2016-09-16 DIAGNOSIS — Z87891 Personal history of nicotine dependence: Secondary | ICD-10-CM

## 2016-09-16 LAB — URINALYSIS, ROUTINE W REFLEX MICROSCOPIC
Bilirubin Urine: NEGATIVE
Glucose, UA: NEGATIVE mg/dL
Ketones, ur: 15 mg/dL — AB
Leukocytes, UA: NEGATIVE
NITRITE: NEGATIVE
PH: 6.5 (ref 5.0–8.0)
Protein, ur: NEGATIVE mg/dL
SPECIFIC GRAVITY, URINE: 1.01 (ref 1.005–1.030)

## 2016-09-16 LAB — CBC WITH DIFFERENTIAL/PLATELET
BASOS PCT: 0 %
Basophils Absolute: 0 10*3/uL (ref 0.0–0.1)
EOS ABS: 0 10*3/uL (ref 0.0–0.7)
EOS PCT: 0 %
HEMATOCRIT: 34.6 % — AB (ref 36.0–46.0)
Hemoglobin: 11.9 g/dL — ABNORMAL LOW (ref 12.0–15.0)
Lymphocytes Relative: 21 %
Lymphs Abs: 2.4 10*3/uL (ref 0.7–4.0)
MCH: 31.2 pg (ref 26.0–34.0)
MCHC: 34.4 g/dL (ref 30.0–36.0)
MCV: 90.6 fL (ref 78.0–100.0)
MONO ABS: 0.3 10*3/uL (ref 0.1–1.0)
Monocytes Relative: 2 %
Neutro Abs: 8.8 10*3/uL — ABNORMAL HIGH (ref 1.7–7.7)
Neutrophils Relative %: 77 %
PLATELETS: 205 10*3/uL (ref 150–400)
RBC: 3.82 MIL/uL — ABNORMAL LOW (ref 3.87–5.11)
RDW: 12.8 % (ref 11.5–15.5)
WBC: 11.5 10*3/uL — ABNORMAL HIGH (ref 4.0–10.5)

## 2016-09-16 LAB — GROUP B STREP BY PCR: Group B strep by PCR: NEGATIVE

## 2016-09-16 LAB — WET PREP, GENITAL
SPERM: NONE SEEN
TRICH WET PREP: NONE SEEN
YEAST WET PREP: NONE SEEN

## 2016-09-16 LAB — RAPID URINE DRUG SCREEN, HOSP PERFORMED
Amphetamines: NOT DETECTED
Barbiturates: NOT DETECTED
Benzodiazepines: NOT DETECTED
Cocaine: NOT DETECTED
Opiates: NOT DETECTED
Tetrahydrocannabinol: NOT DETECTED

## 2016-09-16 LAB — OB RESULTS CONSOLE GC/CHLAMYDIA: GC PROBE AMP, GENITAL: NEGATIVE

## 2016-09-16 LAB — TYPE AND SCREEN
ABO/RH(D): O POS
ANTIBODY SCREEN: NEGATIVE

## 2016-09-16 LAB — URINALYSIS, MICROSCOPIC (REFLEX)

## 2016-09-16 LAB — OB RESULTS CONSOLE GBS: STREP GROUP B AG: NEGATIVE

## 2016-09-16 MED ORDER — MAGNESIUM SULFATE BOLUS VIA INFUSION
4.0000 g | Freq: Once | INTRAVENOUS | Status: AC
  Administered 2016-09-16: 4 g via INTRAVENOUS
  Filled 2016-09-16: qty 500

## 2016-09-16 MED ORDER — NIFEDIPINE 10 MG PO CAPS
10.0000 mg | ORAL_CAPSULE | Freq: Three times a day (TID) | ORAL | Status: DC
  Administered 2016-09-16: 10 mg via ORAL
  Filled 2016-09-16: qty 1

## 2016-09-16 MED ORDER — DOCUSATE SODIUM 100 MG PO CAPS
100.0000 mg | ORAL_CAPSULE | Freq: Every day | ORAL | Status: DC
  Filled 2016-09-16 (×2): qty 1

## 2016-09-16 MED ORDER — ACETAMINOPHEN 325 MG PO TABS: 650.0000 mg | ORAL_TABLET | ORAL | Status: DC | PRN

## 2016-09-16 MED ORDER — CALCIUM CARBONATE ANTACID 500 MG PO CHEW: 2.0000 | CHEWABLE_TABLET | ORAL | Status: DC | PRN

## 2016-09-16 MED ORDER — PANTOPRAZOLE SODIUM 20 MG PO TBEC
20.0000 mg | DELAYED_RELEASE_TABLET | Freq: Every day | ORAL | Status: DC
  Administered 2016-09-16 – 2016-09-17 (×2): 20 mg via ORAL
  Filled 2016-09-16 (×4): qty 1

## 2016-09-16 MED ORDER — ZOLPIDEM TARTRATE 5 MG PO TABS: 5.0000 mg | ORAL_TABLET | Freq: Every evening | ORAL | Status: DC | PRN

## 2016-09-16 MED ORDER — PRENATAL MULTIVITAMIN CH
1.0000 | ORAL_TABLET | Freq: Every day | ORAL | Status: DC
  Administered 2016-09-16 – 2016-09-17 (×2): 1 via ORAL
  Filled 2016-09-16 (×4): qty 1

## 2016-09-16 MED ORDER — NIFEDIPINE 10 MG PO CAPS
20.0000 mg | ORAL_CAPSULE | Freq: Four times a day (QID) | ORAL | Status: DC
  Filled 2016-09-16: qty 2

## 2016-09-16 MED ORDER — SERTRALINE HCL 50 MG PO TABS
50.0000 mg | ORAL_TABLET | Freq: Every day | ORAL | Status: DC
  Administered 2016-09-16 – 2016-09-17 (×2): 50 mg via ORAL
  Filled 2016-09-16 (×3): qty 1

## 2016-09-16 MED ORDER — MAGNESIUM SULFATE 40 G IN LACTATED RINGERS - SIMPLE
2.0000 g/h | INTRAVENOUS | Status: DC
  Administered 2016-09-17: 2 g/h via INTRAVENOUS
  Filled 2016-09-16: qty 40
  Filled 2016-09-16: qty 500

## 2016-09-16 MED ORDER — SERTRALINE HCL 50 MG PO TABS
50.0000 mg | ORAL_TABLET | Freq: Every day | ORAL | Status: DC
  Filled 2016-09-16: qty 1

## 2016-09-16 MED ORDER — DIPHENHYDRAMINE HCL 25 MG PO CAPS
25.0000 mg | ORAL_CAPSULE | Freq: Once | ORAL | Status: AC
  Administered 2016-09-16: 25 mg via ORAL
  Filled 2016-09-16: qty 1

## 2016-09-16 MED ORDER — TERBUTALINE SULFATE 1 MG/ML IJ SOLN
0.2500 mg | Freq: Once | INTRAMUSCULAR | Status: AC
  Administered 2016-09-16: 0.25 mg via SUBCUTANEOUS
  Filled 2016-09-16: qty 1

## 2016-09-16 MED ORDER — BETAMETHASONE SOD PHOS & ACET 6 (3-3) MG/ML IJ SUSP
12.0000 mg | INTRAMUSCULAR | Status: AC
  Administered 2016-09-16 – 2016-09-17 (×2): 12 mg via INTRAMUSCULAR
  Filled 2016-09-16 (×2): qty 2

## 2016-09-16 MED ORDER — NIFEDIPINE 10 MG PO CAPS: 20.0000 mg | ORAL_CAPSULE | Freq: Four times a day (QID) | ORAL | Status: DC

## 2016-09-16 MED ORDER — LACTATED RINGERS IV SOLN
INTRAVENOUS | Status: DC
Start: 2016-09-16 — End: 2016-09-18
  Administered 2016-09-16 – 2016-09-17 (×3): via INTRAVENOUS

## 2016-09-16 NOTE — Progress Notes (Signed)
Patient c/o poor sleep for several days and requested "something" to help her sleep. Dr Vergie LivingPickens was made aware and Benadryl 25 mg x 1 was ordered for sleep.Same administered.

## 2016-09-16 NOTE — Progress Notes (Addendum)
Antepartum Note  09/16/2016 - 6:36 PM  36 y.o. G2P1001 8386w4d. Pregnancy complicated by AMA, depression  Ms. Elwin SleightJennifer D Ugarte is admitted for VB, UCs, ?PTL   Subjective:  Feels UC but not uncomfortable. Saw small about of VB/discharge when went to bathroom (much less than before) Objective:    Current Vital Signs 24h Vital Sign Ranges  T 98.1 F (36.7 C) Temp  Avg: 98.1 F (36.7 C)  Min: 97.9 F (36.6 C)  Max: 98.3 F (36.8 C)  BP (!) 105/57 BP  Min: 105/57  Max: 109/78  HR 75 Pulse  Avg: 75.7  Min: 73  Max: 79  RR 18 Resp  Avg: 17  Min: 16  Max: 18  SaO2 98 % Not Delivered SpO2  Avg: 98.5 %  Min: 98 %  Max: 99 %       24 Hour I/O Current Shift I/O  Time Ins Outs No intake/output data recorded. No intake/output data recorded.  Category I with accels q7-3838m Gen: NAD Gravid, nttp SVE: tight 1cm/25/high/firm/cephalic +scalp stim No blood on glove  Labs:   Recent Labs Lab 09/16/16 1415  WBC 11.5*  HGB 11.9*  HCT 34.6*  PLT 205   O POS   Pending: GC/CT  Medications Current Facility-Administered Medications  Medication Dose Route Frequency Provider Last Rate Last Dose  . acetaminophen (TYLENOL) tablet 650 mg  650 mg Oral Q4H PRN Montez MoritaLawson, Marie D, CNM      . betamethasone acetate-betamethasone sodium phosphate (CELESTONE) injection 12 mg  12 mg Intramuscular Q24 Hr x 2 Montez MoritaLawson, Marie D, CNM   12 mg at 09/16/16 1423  . calcium carbonate (TUMS - dosed in mg elemental calcium) chewable tablet 400 mg of elemental calcium  2 tablet Oral Q4H PRN Montez MoritaLawson, Marie D, CNM      . lactated ringers infusion   Intravenous Continuous Montez MoritaLawson, Marie D, CNM 125 mL/hr at 09/16/16 1423    . NIFEdipine (PROCARDIA) capsule 20 mg  20 mg Oral Q6H Maanvi Lecompte, MD      . prenatal multivitamin tablet 1 tablet  1 tablet Oral Q1200 Montez MoritaLawson, Marie D, CNM      . sertraline (ZOLOFT) tablet 50 mg  50 mg Oral Daily Harraway-Smith, Eber Jonesarolyn, MD      . terbutaline (BRETHINE) injection 0.25 mg  0.25 mg  Subcutaneous Once Friant BingPickens, Alliya Marcon, MD      . zolpidem (AMBIEN) tablet 5 mg  5 mg Oral QHS PRN Montez MoritaLawson, Marie D, CNM        Assessment & Plan:  ?PTL, pt stable *IUP: fetal status reassuring *?PTL: was closed in MAU, but wonder if maybe SVE didn't/couldn't go past ext os. Cervix doesn't feel like a soft, laboring cervix and pt feels comfortable. Pt got the terb, bmz and procardia downstairs pretty close together and possible dizziness from the procardia so will hold off on that. Will do another shot of terb, for symptomatic relief, and recheck in 566m to see if truly in PTL. If makes change (this was my first exam on her), then will start on Mg. BMZ #2 5/27 @ 1430 *GBS: unknown. Check swab if makes change *Analgesia: no needs  Cornelia Copaharlie Shakeema Lippman, Jr. MD Attending Center for [facility](Faculty Practice)

## 2016-09-16 NOTE — Progress Notes (Signed)
Provider a bs for [employer] exam. Blood noted.

## 2016-09-16 NOTE — Progress Notes (Signed)
   09/16/16 2024  Vital Signs  BP (!) 108/55  BP Location Right Arm  Patient Position (if appropriate) Semi-fowlers  BP Method Automatic  Pulse Rate 96  Pulse Rate Source Monitor  Resp 16  Temp 98.2 F (36.8 C)  Temp Source Oral  Oxygen Therapy  SpO2 97 %  O2 Device Room Air  Mg 4g bolus initiated pt remains alert and oriented and c/o abd cramping no meds needed at this time

## 2016-09-16 NOTE — MAU Provider Note (Signed)
History   G2P1001 @ 32.4 wks in with vag bleeding that started upon her waking states noticed it when she went to BR to empty her bladder. Denies any pain or ROM.  CSN: [id number]  Arrival date & time 09/16/16  1128   None     No chief complaint on file.   HPI  Past Medical History:  Diagnosis Date  . Asthma   . Bronchitis   . Scoliosis     Past Surgical History:  Procedure Laterality Date  . NO PAST SURGERIES      Family History  Problem Relation Age of Onset  . Diabetes Mother     Social History  Substance Use Topics  . Smoking status: Former Smoker    Quit date: 12/23/2011  . Smokeless tobacco: Never Used  . Alcohol use No     Comment: occ    OB History    Gravida Para Term Preterm AB Living   2 1 1     1    SAB TAB Ectopic Multiple Live Births                  Review of Systems  Constitutional: Negative.   HENT: Negative.   Eyes: Negative.   Respiratory: Negative.   Cardiovascular: Negative.   Gastrointestinal: Negative.   Endocrine: Negative.   Genitourinary: Positive for vaginal bleeding.  Musculoskeletal: Negative.   Skin: Negative.     Allergies  Other and Rocephin [ceftriaxone sodium in dextrose]  Home Medications    LMP 03/03/2016   Physical Exam  Constitutional: She is oriented to person, place, and time. She appears well-developed and well-nourished.  HENT:  Head: Normocephalic.  Eyes: Pupils are equal, round, and reactive to light.  Neck: Normal range of motion.  Cardiovascular: Normal rate, regular rhythm, normal heart sounds and intact distal pulses.   Pulmonary/Chest: Effort normal and breath sounds normal.  Abdominal: Soft. Bowel sounds are normal.  Genitourinary: Vagina normal and uterus normal.  Musculoskeletal: Normal range of motion.  Neurological: She is alert and oriented to person, place, and time. She has normal reflexes.  Skin: Skin is warm and dry.  Psychiatric: She has a normal mood and affect. Her behavior is  normal. Judgment and thought content normal.    MAU Course  Procedures (including critical care time)  Labs Reviewed  WET PREP, GENITAL  URINALYSIS, ROUTINE W REFLEX MICROSCOPIC  RAPID URINE DRUG SCREEN, HOSP PERFORMED  GC/CHLAMYDIA PROBE AMP ([place]   No results found.   1. Vaginal bleeding in pregnancy, third trimester       MDM  Sterile spec done scat amt thick,mucous type old blood in os, os is closed. Scant amt dark vag bleeding with exam othewise normal exam. Wet prep and cultures done will get u/s to assess. POC discused with Dr. Gomez CleverlyPickins. Pt to be placed on antenatal.

## 2016-09-16 NOTE — Progress Notes (Addendum)
Antepartum Note  09/16/2016 - 7:44 PM  36 y.o. G2P1001 6282w4d. Pregnancy complicated by AMA, depression  Ms. Katie Pope is admitted for VB, UCs, ?PTL   Subjective:  Contractions feels slightly more uncomfortable  Objective:    Current Vital Signs 24h Vital Sign Ranges  T 98.1 F (36.7 C) Temp  Avg: 98.1 F (36.7 C)  Min: 97.9 F (36.6 C)  Max: 98.3 F (36.8 C)  BP (!) 105/57 BP  Min: 105/57  Max: 109/78  HR 75 Pulse  Avg: 75.7  Min: 73  Max: 79  RR 18 Resp  Avg: 17  Min: 16  Max: 18  SaO2 98 % Not Delivered SpO2  Avg: 98.5 %  Min: 98 %  Max: 99 %       24 Hour I/O Current Shift I/O  Time Ins Outs No intake/output data recorded. No intake/output data recorded.  Category I with accels (CTSP for questionable late vs maternal artifact that corrected by itself) q4-720m Gen: NAD Gravid, nttp SVE: tight 1cm/25/high/firm/cephalic (no change) No blood on glove  Labs:   Recent Labs Lab 09/16/16 1415  WBC 11.5*  HGB 11.9*  HCT 34.6*  PLT 205   O POS   Pending: GC/CT  Medications Current Facility-Administered Medications  Medication Dose Route Frequency Provider Last Rate Last Dose  . acetaminophen (TYLENOL) tablet 650 mg  650 mg Oral Q4H PRN Montez MoritaLawson, Marie D, CNM      . betamethasone acetate-betamethasone sodium phosphate (CELESTONE) injection 12 mg  12 mg Intramuscular Q24 Hr x 2 Montez MoritaLawson, Marie D, CNM   12 mg at 09/16/16 1423  . calcium carbonate (TUMS - dosed in mg elemental calcium) chewable tablet 400 mg of elemental calcium  2 tablet Oral Q4H PRN Montez MoritaLawson, Marie D, CNM      . lactated ringers infusion   Intravenous Continuous Montez MoritaLawson, Marie D, CNM 125 mL/hr at 09/16/16 1423    . NIFEdipine (PROCARDIA) capsule 20 mg  20 mg Oral Q6H Maxwell Martorano, MD      . prenatal multivitamin tablet 1 tablet  1 tablet Oral Q1200 Montez MoritaLawson, Marie D, CNM      . sertraline (ZOLOFT) tablet 50 mg  50 mg Oral Daily Harraway-Smith, Eber Jonesarolyn, MD      . zolpidem (AMBIEN) tablet 5 mg  5 mg  Oral QHS PRN Montez MoritaLawson, Marie D, CNM        Assessment & Plan:  ?PTL, pt stable *IUP: fetal status reassuring *?PTL: she did get another dose of terb at 1830. will start Mg since UCs have picked up and pt feels that they are stronger, even though no cx change. BMZ #2 5/27 @ 1430. Will d/c procardia. *GBS: GBS pcr and cx collected. Pt states she got a rash with rocephin. H/o augmentin with no issues. Will do reflex testing but likely okay for ancef if + and in labor.  *Analgesia: no needs  Katie Pope, Jr. MD Attending Center for [facility](Faculty Practice)

## 2016-09-16 NOTE — Discharge Instructions (Signed)
Vaginal Bleeding During Pregnancy, Third Trimester A small amount of bleeding (spotting) from the vagina is relatively common in pregnancy. Various things can cause bleeding or spotting in pregnancy. Sometimes the bleeding is normal and is not a problem. However, bleeding during the third trimester can also be a sign of something serious for the mother and the baby. Be sure to tell your health care provider about any vaginal bleeding right away. Some possible causes of vaginal bleeding during the third trimester include:  The placenta may be partially or completely covering the opening to the cervix (placenta previa).  The placenta may have separated from the uterus (abruption of the placenta).  There may be an infection or growth on the cervix.  You may be starting labor, called discharging of the mucus plug.  The placenta may grow into the muscle layer of the uterus (placenta accreta). Follow these instructions at home: Watch your condition for any changes. The following actions may help to lessen any discomfort you are feeling:  Follow your health care provider's instructions for limiting your activity. If your health care provider orders bed rest, you may need to stay in bed and only get up to use the bathroom. However, your health care provider may allow you to continue light activity.  If needed, make plans for someone to help with your regular activities and responsibilities while you are on bed rest.  Keep track of the number of pads you use each day, how often you change pads, and how soaked (saturated) they are. Write this down.  Do not use tampons. Do not douche.  Do not have sexual intercourse or orgasms until approved by your health care provider.  Follow your health care provider's advice about lifting, driving, and physical activities.  If you pass any tissue from your vagina, save the tissue so you can show it to your health care provider.  Only take over-the-counter or  prescription medicines as directed by your health care provider.  Do not take aspirin because it can make you bleed.  Keep all follow-up appointments as directed by your health care provider. Contact a health care provider if:  You have any vaginal bleeding during any part of your pregnancy.  You have cramps or labor pains.  You have a fever, not controlled by medicine. Get help right away if:  You have severe cramps or pain in your back or belly (abdomen).  You have chills.  You have a gush of fluid from the vagina.  You pass large clots or tissue from your vagina.  Your bleeding increases.  You feel light-headed or weak.  You pass out.  You feel less movement or no movement of the baby. This information is not intended to replace advice given to you by your health care provider. Make sure you discuss any questions you have with your health care provider. Document Released: 07/01/2002 Document Revised: 09/16/2015 Document Reviewed: 12/16/2012 Elsevier Interactive Patient Education  2017 Elsevier Inc.  

## 2016-09-16 NOTE — Progress Notes (Signed)
Strep B and culture and PCR collected and sent to lab

## 2016-09-16 NOTE — H&P (Signed)
History   G2P1001 @ 32.4 wks in with vag bleeding that started upon her waking states noticed it when she went to BR to empty her bladder. Denies any pain or ROM.  CSN: [id number]  Arrival date & time 09/16/16  1128   None     No chief complaint on file.   HPI  Past Medical History:  Diagnosis Date  . Asthma   . Bronchitis   . Scoliosis     Past Surgical History:  Procedure Laterality Date  . NO PAST SURGERIES      Family History  Problem Relation Age of Onset  . Diabetes Mother     Social History  Substance Use Topics  . Smoking status: Former Smoker    Quit date: 12/23/2011  . Smokeless tobacco: Never Used  . Alcohol use No     Comment: occ    OB History    Gravida Para Term Preterm AB Living   2 1 1     1   SAB TAB Ectopic Multiple Live Births                  Review of Systems  Constitutional: Negative.   HENT: Negative.   Eyes: Negative.   Respiratory: Negative.   Cardiovascular: Negative.   Gastrointestinal: Negative.   Endocrine: Negative.   Genitourinary: Positive for vaginal bleeding.  Musculoskeletal: Negative.   Skin: Negative.     Allergies  Other and Rocephin [ceftriaxone sodium in dextrose]  Home Medications    LMP 03/03/2016   Physical Exam  Constitutional: She is oriented to person, place, and time. She appears well-developed and well-nourished.  HENT:  Head: Normocephalic.  Eyes: Pupils are equal, round, and reactive to light.  Neck: Normal range of motion.  Cardiovascular: Normal rate, regular rhythm, normal heart sounds and intact distal pulses.   Pulmonary/Chest: Effort normal and breath sounds normal.  Abdominal: Soft. Bowel sounds are normal.  Genitourinary: Vagina normal and uterus normal.  Musculoskeletal: Normal range of motion.  Neurological: She is alert and oriented to person, place, and time. She has normal reflexes.  Skin: Skin is warm and dry.  Psychiatric: She has a normal mood and affect. Her behavior is  normal. Judgment and thought content normal.    MAU Course  Procedures (including critical care time)  Labs Reviewed  WET PREP, GENITAL  URINALYSIS, ROUTINE W REFLEX MICROSCOPIC  RAPID URINE DRUG SCREEN, HOSP PERFORMED  GC/CHLAMYDIA PROBE AMP ([place]) NOT AT [facility]   No results found.   1. Vaginal bleeding in pregnancy, third trimester       MDM  Sterile spec done scat amt thick,mucous type old blood in os, os is closed. Scant amt dark vag bleeding with exam othewise normal exam. Wet prep and cultures done will get u/s to assess. POC discused with Dr. Pickins. Pt to be placed on antenatal.        History   G2P1001 @ 32.4 wks in with vag bleeding that started upon her waking states noticed it when she went to BR to empty her bladder. Denies any pain or ROM.  CSN: [id number]  Arrival date & time 09/16/16  1128   None     No chief complaint on file.   HPI  Past Medical History:  Diagnosis Date  . Asthma   . Bronchitis   . Scoliosis     Past Surgical History:  Procedure Laterality Date  . NO PAST SURGERIES      Family History  Problem Relation Age of Onset  . Diabetes Mother     Social History  Substance Use Topics  . Smoking status: Former Smoker    Quit date: 12/23/2011  . Smokeless tobacco: Never Used  . Alcohol use No     Comment: occ    OB History    Gravida Para Term Preterm AB Living   2 1 1     1    SAB TAB Ectopic Multiple Live Births                  Review of Systems  Constitutional: Negative.   HENT: Negative.   Eyes: Negative.   Respiratory: Negative.   Cardiovascular: Negative.   Gastrointestinal: Negative.   Endocrine: Negative.   Genitourinary: Positive for vaginal bleeding.  Musculoskeletal: Negative.   Skin: Negative.     Allergies  Other and Rocephin [ceftriaxone sodium in dextrose]  Home Medications    LMP 03/03/2016   Physical Exam  Constitutional: She is oriented to person, place, and time. She appears well-developed and well-nourished.  HENT:  Head: Normocephalic.  Eyes: Pupils are equal, round, and reactive to light.  Neck: Normal range of motion.  Cardiovascular: Normal rate, regular rhythm, normal heart sounds and intact distal pulses.   Pulmonary/Chest: Effort normal and breath sounds normal.  Abdominal: Soft. Bowel sounds are normal.  Genitourinary: Vagina normal and uterus normal.  Musculoskeletal: Normal range of motion.  Neurological: She is alert and oriented to person, place, and time. She has normal reflexes.  Skin: Skin is warm and dry.  Psychiatric: She has a normal mood and affect. Her behavior  is normal. Judgment and thought content normal.    MAU Course  Procedures (including critical care time)  Labs Reviewed  WET PREP, GENITAL  URINALYSIS, ROUTINE W REFLEX MICROSCOPIC  RAPID URINE DRUG SCREEN, HOSP PERFORMED  GC/CHLAMYDIA PROBE AMP ([place]) NOT AT [facility]   No results found.   1. Vaginal bleeding in pregnancy, third trimester       MDM  Sterile spec done scat amt thick,mucous type old blood in os, os is closed. Scant amt dark vag bleeding with exam othewise normal exam. Wet prep and cultures done will get u/s to assess. POC discused with Dr. Gomez Cleverly. Pt to be placed on antenatal.

## 2016-09-16 NOTE — MAU Note (Signed)
C/o vaginal bleeding this AM after using the restroom; not bleeding at present; c/o intermittent abdominal cramping for past 2 weeks;

## 2016-09-17 DIAGNOSIS — O479 False labor, unspecified: Secondary | ICD-10-CM | POA: Diagnosis present

## 2016-09-17 DIAGNOSIS — O4700 False labor before 37 completed weeks of gestation, unspecified trimester: Secondary | ICD-10-CM | POA: Diagnosis present

## 2016-09-17 MED ORDER — BUTALBITAL-APAP-CAFFEINE 50-325-40 MG PO TABS
1.0000 | ORAL_TABLET | Freq: Four times a day (QID) | ORAL | Status: DC | PRN
  Administered 2016-09-17: 1 via ORAL
  Filled 2016-09-17: qty 1

## 2016-09-17 MED ORDER — ONDANSETRON HCL 4 MG/2ML IJ SOLN
4.0000 mg | INTRAMUSCULAR | Status: DC | PRN
  Administered 2016-09-17 (×2): 4 mg via INTRAVENOUS
  Filled 2016-09-17 (×2): qty 2

## 2016-09-17 NOTE — Progress Notes (Signed)
Antepartum Note  09/17/2016 - 7:06 AM  36 y.o. G2P1001 4226w5d. Pregnancy complicated by AMA, depression  Ms. Katie Pope is admitted for VB, UCs, ?PTL  Overnight/24hr events:  Patient able to sleep last night  Subjective:  stil able to feel UCs, comfortable. No VB, LOF or s/s of Mg toxicity  Objective:    Current Vital Signs 24h Vital Sign Ranges  T 98.1 F (36.7 C) Temp  Avg: 98.1 F (36.7 C)  Min: 97.9 F (36.6 C)  Max: 98.3 F (36.8 C)  BP (!) 99/55 BP  Min: 90/53  Max: 113/62  HR 62 Pulse  Avg: 85.1  Min: 62  Max: 119  RR 16 Resp  Avg: 16.4  Min: 16  Max: 18  SaO2 93 % Not Delivered SpO2  Avg: 95.7 %  Min: 92 %  Max: 99 %       24 Hour I/O Current Shift I/O  Time Ins Outs 05/26 0701 - 05/27 0700 In: 1807.1 [P.O.:60; I.V.:1747.1] Out: 1350 [Urine:1350] No intake/output data recorded.  UOP: >12400mL/hr  Category I with accels q3-7152m NAD  Labs:   Recent Labs Lab 09/16/16 1415  WBC 11.5*  HGB 11.9*  HCT 34.6*  PLT 205   O POS   Pending: GC/CT  Medications Current Facility-Administered Medications  Medication Dose Route Frequency Provider Last Rate Last Dose  . acetaminophen (TYLENOL) tablet 650 mg  650 mg Oral Q4H PRN Montez MoritaLawson, Marie D, CNM      . betamethasone acetate-betamethasone sodium phosphate (CELESTONE) injection 12 mg  12 mg Intramuscular Q24 Hr x 2 Montez MoritaLawson, Marie D, CNM   12 mg at 09/16/16 1423  . butalbital-acetaminophen-caffeine (FIORICET, ESGIC) 50-325-40 MG per tablet 1 tablet  1 tablet Oral Q6H PRN [place] BingPickens, Wendal Wilkie, MD      . calcium carbonate (TUMS - dosed in mg elemental calcium) chewable tablet 400 mg of elemental calcium  2 tablet Oral Q4H PRN Montez MoritaLawson, Marie D, CNM      . lactated ringers infusion   Intravenous Continuous Montez MoritaLawson, Marie D, CNM 100 mL/hr at 09/17/16 [id number]    . magnesium sulfate 40 grams in LR 500 mL OB infusion  2 g/hr Intravenous Continuous [place] BingPickens, Hephzibah Strehle, MD 25 mL/hr at 09/16/16 2046 2 g/hr at 09/16/16 2046  .  pantoprazole (PROTONIX) EC tablet 20 mg  20 mg Oral Daily [place] BingPickens, Commodore Bellew, MD   20 mg at 09/16/16 2048  . prenatal multivitamin tablet 1 tablet  1 tablet Oral Q1200 Montez MoritaLawson, Marie D, CNM   1 tablet at 09/16/16 2152  . sertraline (ZOLOFT) tablet 50 mg  50 mg Oral Daily Willodean RosenthalHarraway-Smith, Carolyn, MD   50 mg at 09/16/16 2055    Assessment & Plan:  Pt stable  *IUP: fetal status reassuring *?PTL: Cont Mg until BMZ#2 at 1430 and recheck cx. If stable then okay to d/c EFM and Mg.  *VB: no current s/s. Last bleed 5/26. Rh pos -5/26: ceph, normal AFI, no e/o abruption *Depression: continue zoloft *GBS: GBS pcr neg *Analgesia: no needs  Katie Pope, Jr. MD Attending Center for [facility](Faculty Practice)

## 2016-09-17 NOTE — Progress Notes (Signed)
Pt D/C home with significant other. IV removed, D/C instructions and F/U appointment reviewed with patient. Pt verbalized understanding and ambulated off unit with boyfriend. No concerns voiced at the time of discharge. 

## 2016-09-18 ENCOUNTER — Encounter: Payer: Self-pay | Admitting: Obstetrics and Gynecology

## 2016-09-18 ENCOUNTER — Inpatient Hospital Stay (HOSPITAL_COMMUNITY)
Admission: AD | Admit: 2016-09-18 | Discharge: 2016-09-28 | DRG: 774 | Disposition: A | Discharge status: Home / Self Care | Payer: Medicaid Other | Source: Ambulatory Visit | Attending: Family Medicine | Admitting: Family Medicine

## 2016-09-18 ENCOUNTER — Encounter (HOSPITAL_COMMUNITY): Payer: Self-pay

## 2016-09-18 DIAGNOSIS — O99344 Other mental disorders complicating childbirth: Secondary | ICD-10-CM | POA: Diagnosis present

## 2016-09-18 DIAGNOSIS — O4693 Antepartum hemorrhage, unspecified, third trimester: Secondary | ICD-10-CM

## 2016-09-18 DIAGNOSIS — O4690 Antepartum hemorrhage, unspecified, unspecified trimester: Secondary | ICD-10-CM

## 2016-09-18 DIAGNOSIS — Z3A33 33 weeks gestation of pregnancy: Secondary | ICD-10-CM | POA: Diagnosis not present

## 2016-09-18 DIAGNOSIS — M419 Scoliosis, unspecified: Secondary | ICD-10-CM | POA: Diagnosis present

## 2016-09-18 DIAGNOSIS — F329 Major depressive disorder, single episode, unspecified: Secondary | ICD-10-CM | POA: Diagnosis present

## 2016-09-18 DIAGNOSIS — Z3A32 32 weeks gestation of pregnancy: Secondary | ICD-10-CM | POA: Diagnosis not present

## 2016-09-18 DIAGNOSIS — O09529 Supervision of elderly multigravida, unspecified trimester: Secondary | ICD-10-CM

## 2016-09-18 DIAGNOSIS — O09522 Supervision of elderly multigravida, second trimester: Secondary | ICD-10-CM | POA: Diagnosis not present

## 2016-09-18 DIAGNOSIS — O42913 Preterm premature rupture of membranes, unspecified as to length of time between rupture and onset of labor, third trimester: Secondary | ICD-10-CM | POA: Diagnosis present

## 2016-09-18 DIAGNOSIS — O4700 False labor before 37 completed weeks of gestation, unspecified trimester: Secondary | ICD-10-CM | POA: Diagnosis present

## 2016-09-18 DIAGNOSIS — Z3A34 34 weeks gestation of pregnancy: Secondary | ICD-10-CM | POA: Diagnosis not present

## 2016-09-18 DIAGNOSIS — O42919 Preterm premature rupture of membranes, unspecified as to length of time between rupture and onset of labor, unspecified trimester: Secondary | ICD-10-CM | POA: Diagnosis present

## 2016-09-18 DIAGNOSIS — O479 False labor, unspecified: Secondary | ICD-10-CM | POA: Diagnosis present

## 2016-09-18 DIAGNOSIS — Z87891 Personal history of nicotine dependence: Secondary | ICD-10-CM | POA: Diagnosis not present

## 2016-09-18 DIAGNOSIS — R109 Unspecified abdominal pain: Secondary | ICD-10-CM

## 2016-09-18 DIAGNOSIS — O4703 False labor before 37 completed weeks of gestation, third trimester: Secondary | ICD-10-CM | POA: Diagnosis not present

## 2016-09-18 LAB — URINALYSIS, ROUTINE W REFLEX MICROSCOPIC
Bacteria, UA: NONE SEEN
Bilirubin Urine: NEGATIVE
GLUCOSE, UA: NEGATIVE mg/dL
Ketones, ur: NEGATIVE mg/dL
Leukocytes, UA: NEGATIVE
Nitrite: NEGATIVE
PH: 6 (ref 5.0–8.0)
Protein, ur: 30 mg/dL — AB
SPECIFIC GRAVITY, URINE: 1.028 (ref 1.005–1.030)

## 2016-09-18 LAB — URINE CULTURE

## 2016-09-18 LAB — CBC
HEMATOCRIT: 32.1 % — AB (ref 36.0–46.0)
HEMOGLOBIN: 10.9 g/dL — AB (ref 12.0–15.0)
MCH: 30.9 pg (ref 26.0–34.0)
MCHC: 34 g/dL (ref 30.0–36.0)
MCV: 90.9 fL (ref 78.0–100.0)
Platelets: 198 10*3/uL (ref 150–400)
RBC: 3.53 MIL/uL — ABNORMAL LOW (ref 3.87–5.11)
RDW: 12.8 % (ref 11.5–15.5)
WBC: 12.9 10*3/uL — AB (ref 4.0–10.5)

## 2016-09-18 LAB — TYPE AND SCREEN
ABO/RH(D): O POS
Antibody Screen: NEGATIVE

## 2016-09-18 MED ORDER — CALCIUM CARBONATE ANTACID 500 MG PO CHEW: 2.0000 | CHEWABLE_TABLET | ORAL | Status: DC | PRN

## 2016-09-18 MED ORDER — LACTATED RINGERS IV SOLN
INTRAVENOUS | Status: DC
  Administered 2016-09-18: 21:00:00 via INTRAVENOUS

## 2016-09-18 MED ORDER — DOCUSATE SODIUM 100 MG PO CAPS: 100.0000 mg | ORAL_CAPSULE | Freq: Every day | ORAL | Status: DC

## 2016-09-18 MED ORDER — DOCUSATE SODIUM 100 MG PO CAPS
100.0000 mg | ORAL_CAPSULE | Freq: Two times a day (BID) | ORAL | Status: DC | PRN
  Administered 2016-09-23 – 2016-09-25 (×4): 100 mg via ORAL
  Filled 2016-09-18 (×5): qty 1

## 2016-09-18 MED ORDER — SERTRALINE HCL 50 MG PO TABS
50.0000 mg | ORAL_TABLET | Freq: Every day | ORAL | Status: DC
  Administered 2016-09-19 – 2016-09-25 (×8): 50 mg via ORAL
  Filled 2016-09-18 (×8): qty 1

## 2016-09-18 MED ORDER — ACETAMINOPHEN 325 MG PO TABS
650.0000 mg | ORAL_TABLET | ORAL | Status: DC | PRN
  Administered 2016-09-22: 650 mg via ORAL
  Filled 2016-09-18: qty 2

## 2016-09-18 MED ORDER — ZOLPIDEM TARTRATE 5 MG PO TABS
5.0000 mg | ORAL_TABLET | Freq: Every evening | ORAL | Status: DC | PRN
  Administered 2016-09-20 – 2016-09-24 (×3): 5 mg via ORAL
  Filled 2016-09-18 (×3): qty 1

## 2016-09-18 MED ORDER — PRENATAL MULTIVITAMIN CH
1.0000 | ORAL_TABLET | Freq: Every day | ORAL | Status: DC
  Administered 2016-09-19 – 2016-09-25 (×8): 1 via ORAL
  Filled 2016-09-18 (×8): qty 1

## 2016-09-18 MED ORDER — PANTOPRAZOLE SODIUM 20 MG PO TBEC
20.0000 mg | DELAYED_RELEASE_TABLET | Freq: Every day | ORAL | Status: DC
  Administered 2016-09-19 – 2016-09-25 (×7): 20 mg via ORAL
  Filled 2016-09-18 (×8): qty 1

## 2016-09-18 NOTE — MAU Note (Signed)
Pt states she was recently admitted for vaginal bleeding and discharged last night. States she started having more vaginal bleeding today-mucous/bloody. States she was 1cm. States she feels some lower abdominal and back pain. Reports good fetal movement.

## 2016-09-18 NOTE — MAU Provider Note (Signed)
History     CSN: [id number]  Arrival date and time: 09/18/16 1916   None     Chief Complaint  Patient presents with  . Vaginal Bleeding   HPI   [name] is a 36 y.o. female G2P1001 @ 188w6d here in MAU with continued vaginal bleeding.  She was seen on Saturday for this same complaint and was admitted to the hospital. She received magnesium and betamethasone. She was discharged home last night. + fetal movement. Bleeding is stringy and mucus and bright red with clots.  + Lower abdominal cramping.   OB History    Gravida Para Term Preterm AB Living   2 1 1     1    SAB TAB Ectopic Multiple Live Births                  Past Medical History:  Diagnosis Date  . Asthma   . Bronchitis   . Scoliosis     Past Surgical History:  Procedure Laterality Date  . NO PAST SURGERIES      Family History  Problem Relation Age of Onset  . Diabetes Mother     Social History  Substance Use Topics  . Smoking status: Former Smoker    Quit date: 12/23/2011  . Smokeless tobacco: Former NeurosurgeonUser  . Alcohol use No     Comment: occ    Allergies:  Allergies  Allergen Reactions  . Other Rash    seafood  . Rocephin [Ceftriaxone Sodium In Dextrose] Rash    Prescriptions Prior to Admission  Medication Sig Dispense Refill Last Dose  . cetirizine (ZYRTEC) 10 MG tablet Take 10 mg by mouth daily.   09/15/2016 at Unknown time  . ondansetron (ZOFRAN ODT) 4 MG disintegrating tablet Take 1 tablet (4 mg total) by mouth every 6 (six) hours as needed for nausea. 20 tablet 1 09/15/2016 at Unknown time  . pantoprazole (PROTONIX) 40 MG tablet Take 1 tablet (40 mg total) by mouth daily. 30 tablet 1 09/15/2016 at Unknown time  . Prenat-FeFmCb-DSS-FA-DHA w/o A (CITRANATAL HARMONY) 27-1-260 MG CAPS Take 1 capsule by mouth daily before breakfast. 90 capsule 3 09/15/2016 at Unknown time  . sertraline (ZOLOFT) 50 MG tablet Take 1 tablet (50 mg total) by mouth daily. 30 tablet 2 09/15/2016 at Unknown time    Results for orders placed or performed during the hospital encounter of 09/18/16 (from the past 48 hour(s))  Urinalysis, Routine w reflex microscopic     Status: Abnormal   Collection Time: 09/18/16  7:22 PM  Result Value Ref Range   Color, Urine YELLOW YELLOW   APPearance HAZY (A) CLEAR   Specific Gravity, Urine 1.028 1.005 - 1.030   pH 6.0 5.0 - 8.0   Glucose, UA NEGATIVE NEGATIVE mg/dL   Hgb urine dipstick SMALL (A) NEGATIVE   Bilirubin Urine NEGATIVE NEGATIVE   Ketones, ur NEGATIVE NEGATIVE mg/dL   Protein, ur 30 (A) NEGATIVE mg/dL   Nitrite NEGATIVE NEGATIVE   Leukocytes, UA NEGATIVE NEGATIVE   RBC / HPF 0-5 0 - 5 RBC/hpf   WBC, UA 0-5 0 - 5 WBC/hpf   Bacteria, UA NONE SEEN NONE SEEN   Squamous Epithelial / LPF 6-30 (A) NONE SEEN   Mucous PRESENT     Review of Systems  Cardiovascular: Negative.   Gastrointestinal: Positive for abdominal pain.   Physical Exam   Blood pressure 110/67, pulse 67, temperature 98.4 F (36.9 C), temperature source Oral, resp. rate 16, height 5\' 6"  (  1.676 m), weight 138 lb (62.6 kg), last menstrual period 03/03/2016, SpO2 100 %.  Physical Exam  Constitutional: She is oriented to person, place, and time. She appears well-developed and well-nourished. No distress.  HENT:  Head: Normocephalic.  Eyes: Pupils are equal, round, and reactive to light.  GI: Soft. She exhibits no distension. There is no tenderness. There is no rebound.  Genitourinary:  Genitourinary Comments: Vagina - Small- moderate amount of pink vaginal discharge Cervix - No contact bleeding, no active bleeding, small amount of pink discharge oozing from os.  Bimanual exam: Cervix: fingertip-1cm, thick, soft, posterior  Chaperone present for exam.   Musculoskeletal: Normal range of motion.  Neurological: She is alert and oriented to person, place, and time.  Skin: Skin is warm. She is not diaphoretic.  Psychiatric: Her behavior is normal.   Fetal Tracing: Baseline: 130  bpm Variability: Moderate  Accelerations: 15x15 Decelerations: None Toco: Occasional UI   MAU Course  Procedures  None  MDM  Discussed patient with Dr. Vergie Living Will admit X 7 days. CBC  Assessment and Plan   A:  1. Vaginal bleeding in pregnancy, third trimester   2. Abdominal cramping     P:  Admit to [facility] per Dr. Vergie Living  Routine orders    Rasch, Harolyn Rutherford, NP 09/18/2016 8:59 PM

## 2016-09-19 ENCOUNTER — Inpatient Hospital Stay (HOSPITAL_COMMUNITY): Payer: Medicaid Other

## 2016-09-19 DIAGNOSIS — Z3A33 33 weeks gestation of pregnancy: Secondary | ICD-10-CM

## 2016-09-19 DIAGNOSIS — O09522 Supervision of elderly multigravida, second trimester: Secondary | ICD-10-CM

## 2016-09-19 DIAGNOSIS — O4693 Antepartum hemorrhage, unspecified, third trimester: Secondary | ICD-10-CM

## 2016-09-19 DIAGNOSIS — O4703 False labor before 37 completed weeks of gestation, third trimester: Secondary | ICD-10-CM

## 2016-09-19 LAB — GC/CHLAMYDIA PROBE AMP (~~LOC~~) NOT AT ARMC
Chlamydia: NEGATIVE
Neisseria Gonorrhea: NEGATIVE

## 2016-09-19 NOTE — Discharge Summary (Signed)
Antenatal Physician Discharge Summary  Patient ID: Katie Pope MRN: [id number] DOB/AGE: [birth year] 36 y.o.  Admit date: 09/16/2016 Discharge date: 09/19/2016  Admission Diagnoses: preterm contractions; no cervical change; vaginal bleeding  Discharge Diagnoses: same  Significant Diagnostic Studies:  Results for orders placed or performed during the hospital encounter of 09/18/16 (from the past 168 hour(s))  Urinalysis, Routine w reflex microscopic   Collection Time: 09/18/16  7:22 PM  Result Value Ref Range   Color, Urine YELLOW YELLOW   APPearance HAZY (A) CLEAR   Specific Gravity, Urine 1.028 1.005 - 1.030   pH 6.0 5.0 - 8.0   Glucose, UA NEGATIVE NEGATIVE mg/dL   Hgb urine dipstick SMALL (A) NEGATIVE   Bilirubin Urine NEGATIVE NEGATIVE   Ketones, ur NEGATIVE NEGATIVE mg/dL   Protein, ur 30 (A) NEGATIVE mg/dL   Nitrite NEGATIVE NEGATIVE   Leukocytes, UA NEGATIVE NEGATIVE   RBC / HPF 0-5 0 - 5 RBC/hpf   WBC, UA 0-5 0 - 5 WBC/hpf   Bacteria, UA NONE SEEN NONE SEEN   Squamous Epithelial / LPF 6-30 (A) NONE SEEN   Mucous PRESENT   CBC on admission   Collection Time: 09/18/16  9:25 PM  Result Value Ref Range   WBC 12.9 (H) 4.0 - 10.5 K/uL   RBC 3.53 (L) 3.87 - 5.11 MIL/uL   Hemoglobin 10.9 (L) 12.0 - 15.0 g/dL   HCT 65.7 (L) 84.6 - 96.2 %   MCV 90.9 78.0 - 100.0 fL   MCH 30.9 26.0 - 34.0 pg   MCHC 34.0 30.0 - 36.0 g/dL   RDW 95.2 84.1 - 32.4 %   Platelets 198 150 - 400 K/uL  Type and screen [facility] [place]   Collection Time: 09/18/16  9:25 PM  Result Value Ref Range   ABO/RH(D) O POS    Antibody Screen NEG    Sample Expiration 09/21/2016   Results for orders placed or performed during the hospital encounter of 09/16/16 (from the past 168 hour(s))  Urine culture   Collection Time: 09/16/16 11:40 AM  Result Value Ref Range   Specimen Description URINE, RANDOM    Special Requests NONE    Culture MULTIPLE SPECIES PRESENT, SUGGEST RECOLLECTION (A)     Report Status 09/18/2016 FINAL   Urinalysis, Routine w reflex microscopic   Collection Time: 09/16/16 11:40 AM  Result Value Ref Range   Color, Urine YELLOW YELLOW   APPearance CLEAR CLEAR   Specific Gravity, Urine 1.010 1.005 - 1.030   pH 6.5 5.0 - 8.0   Glucose, UA NEGATIVE NEGATIVE mg/dL   Hgb urine dipstick LARGE (A) NEGATIVE   Bilirubin Urine NEGATIVE NEGATIVE   Ketones, ur 15 (A) NEGATIVE mg/dL   Protein, ur NEGATIVE NEGATIVE mg/dL   Nitrite NEGATIVE NEGATIVE   Leukocytes, UA NEGATIVE NEGATIVE  Urine rapid drug screen (hosp performed)   Collection Time: 09/16/16 11:40 AM  Result Value Ref Range   Opiates NONE DETECTED NONE DETECTED   Cocaine NONE DETECTED NONE DETECTED   Benzodiazepines NONE DETECTED NONE DETECTED   Amphetamines NONE DETECTED NONE DETECTED   Tetrahydrocannabinol NONE DETECTED NONE DETECTED   Barbiturates NONE DETECTED NONE DETECTED  Urinalysis, Microscopic (reflex)   Collection Time: 09/16/16 11:40 AM  Result Value Ref Range   RBC / HPF 0-5 0 - 5 RBC/hpf   WBC, UA 0-5 0 - 5 WBC/hpf   Bacteria, UA MANY (A) NONE SEEN   Squamous Epithelial / LPF 6-30 (A) NONE SEEN  Wet prep,  genital   Collection Time: 09/16/16 11:45 AM  Result Value Ref Range   Yeast Wet Prep HPF POC NONE SEEN NONE SEEN   Trich, Wet Prep NONE SEEN NONE SEEN   Clue Cells Wet Prep HPF POC PRESENT (A) NONE SEEN   WBC, Wet Prep HPF POC MODERATE (A) NONE SEEN   Sperm NONE SEEN   CBC with Differential/Platelet   Collection Time: 09/16/16  2:15 PM  Result Value Ref Range   WBC 11.5 (H) 4.0 - 10.5 K/uL   RBC 3.82 (L) 3.87 - 5.11 MIL/uL   Hemoglobin 11.9 (L) 12.0 - 15.0 g/dL   HCT 40.9 (L) 81.1 - 91.4 %   MCV 90.6 78.0 - 100.0 fL   MCH 31.2 26.0 - 34.0 pg   MCHC 34.4 30.0 - 36.0 g/dL   RDW 78.2 95.6 - 21.3 %   Platelets 205 150 - 400 K/uL   Neutrophils Relative % 77 %   Neutro Abs 8.8 (H) 1.7 - 7.7 K/uL   Lymphocytes Relative 21 %   Lymphs Abs 2.4 0.7 - 4.0 K/uL   Monocytes  Relative 2 %   Monocytes Absolute 0.3 0.1 - 1.0 K/uL   Eosinophils Relative 0 %   Eosinophils Absolute 0.0 0.0 - 0.7 K/uL   Basophils Relative 0 %   Basophils Absolute 0.0 0.0 - 0.1 K/uL  Type and screen [facility] [place]   Collection Time: 09/16/16  2:15 PM  Result Value Ref Range   ABO/RH(D) O POS    Antibody Screen NEG    Sample Expiration 09/19/2016   Group B strep by PCR   Collection Time: 09/16/16  7:50 PM  Result Value Ref Range   Group B strep by PCR NEGATIVE NEGATIVE    Treatments: IV hydration, steroids: Betamethasone and magnesium sulfate  Hospital Course:  This is a 36 y.o. G2P1001 with IUP at [redacted]w[redacted]d admitted for eval of preterm contractions and vaginal bleeding that was thought to be min and possibly duer to some min cervical change. She was admitted with contractions, noted to have a cervical exam of long and closed.  No leaking of fluid and no active bleeding was noted.  She was initially started on magnesium sulfate for tocolysis and neuroprotection and also received betamethasone x 2 doses. Her contraction s stopped.  She was observed, fetal heart rate monitoring remained reassuring, and she had no signs/symptoms of progressing preterm labor or other maternal-fetal concerns.  Her cervical exam was unchanged from admission.  She was deemed stable for discharge to home with outpatient follow up.  Discharge Exam: BP (!) 100/55 (BP Location: Right Arm)   Pulse 68   Temp 98.2 F (36.8 C) (Oral)   Resp 16   Ht 5\' 5"  (1.651 m)   Wt 136 lb 8 oz (61.9 kg)   LMP 03/03/2016   SpO2 99%   BMI 22.71 kg/m  Cervix recheck: long and closed  Discharge Condition: good  Disposition: 01-Home or Self Care   Allergies as of 09/17/2016      Reactions   Other Rash   seafood   Rocephin [ceftriaxone Sodium In Dextrose] Rash      Medication List    TAKE these medications   cetirizine 10 MG tablet Commonly known as:  ZYRTEC Take 10 mg by mouth daily.    CITRANATAL HARMONY 27-1-260 MG Caps Take 1 capsule by mouth daily before breakfast.   ondansetron 4 MG disintegrating tablet Commonly known as:  ZOFRAN ODT Take 1  tablet (4 mg total) by mouth every 6 (six) hours as needed for nausea.   pantoprazole 40 MG tablet Commonly known as:  PROTONIX Take 1 tablet (40 mg total) by mouth daily.   sertraline 50 MG tablet Commonly known as:  ZOLOFT Take 1 tablet (50 mg total) by mouth daily.        Signed: Willodean Rosenthalarolyn Harraway-Smith M.D. 09/19/2016, 8:26 AM

## 2016-09-19 NOTE — Progress Notes (Signed)
Patient ID: Katie Pope, female   DOB: [birth year], 36 y.o.   MRN: [id number]  FACULTY PRACTICE ANTEPARTUM NOTE  Katie SleightJennifer D Resh is a 36 y.o. G2P1001 at 1679w0d  who is admitted for vaginal bleeding.   Fetal presentation is unsure. Length of Stay:  1  Days  Subjective: Reports no active bleeding on pad.  Patient reports good fetal movement.   She reports no uterine contractions She reports no loss of fluid per vagina.  Vitals:  Blood pressure 108/67, pulse (!) 52, temperature 98.2 F (36.8 C), temperature source Oral, resp. rate 18, height 5\' 6"  (1.676 m), weight 138 lb (62.6 kg), last menstrual period 03/03/2016, SpO2 100 %. Physical Examination:  General appearance - alert, well appearing, and in no distress Fundal Height:  size equals dates Extremities: extremities normal, atraumatic, no cyanosis or edema  Membranes:intact  Fetal Monitoring:  Baseline: 135 bpm, Variability: Good {> 6 bpm), Accelerations: Reactive and Decelerations: Absent  Labs:  Results for orders placed or performed during the hospital encounter of 09/18/16 (from the past 24 hour(s))  Urinalysis, Routine w reflex microscopic   Collection Time: 09/18/16  7:22 PM  Result Value Ref Range   Color, Urine YELLOW YELLOW   APPearance HAZY (A) CLEAR   Specific Gravity, Urine 1.028 1.005 - 1.030   pH 6.0 5.0 - 8.0   Glucose, UA NEGATIVE NEGATIVE mg/dL   Hgb urine dipstick SMALL (A) NEGATIVE   Bilirubin Urine NEGATIVE NEGATIVE   Ketones, ur NEGATIVE NEGATIVE mg/dL   Protein, ur 30 (A) NEGATIVE mg/dL   Nitrite NEGATIVE NEGATIVE   Leukocytes, UA NEGATIVE NEGATIVE   RBC / HPF 0-5 0 - 5 RBC/hpf   WBC, UA 0-5 0 - 5 WBC/hpf   Bacteria, UA NONE SEEN NONE SEEN   Squamous Epithelial / LPF 6-30 (A) NONE SEEN   Mucous PRESENT   CBC on admission   Collection Time: 09/18/16  9:25 PM  Result Value Ref Range   WBC 12.9 (H) 4.0 - 10.5 K/uL   RBC 3.53 (L) 3.87 - 5.11 MIL/uL   Hemoglobin 10.9 (L) 12.0 - 15.0 g/dL   HCT 40.932.1 (L) 81.136.0 - 91.446.0 %   MCV 90.9 78.0 - 100.0 fL   MCH 30.9 26.0 - 34.0 pg   MCHC 34.0 30.0 - 36.0 g/dL   RDW 78.212.8 95.611.5 - 21.315.5 %   Platelets 198 150 - 400 K/uL  [facility] [place]   Collection Time: 09/18/16  9:25 PM  Result Value Ref Range   ABO/RH(D) O POS    Antibody Screen NEG    Sample Expiration 09/21/2016     Imaging Studies:       Medications:  Scheduled . pantoprazole  20 mg Oral Daily  . prenatal multivitamin  1 tablet Oral Q1200  . sertraline  50 mg Oral Daily   I have reviewed the patient's current medications.  ASSESSMENT: Active Problems:   Vaginal bleeding in pregnancy, third trimester   PLAN: 1. Vaginal bleeding  S/p BMZ (5/26, 5/27)  Reactive NST  No further bleeding (last bleed yesterday) Continue routine antenatal care.   Levie HeritageStinson, Joelie Schou J, DO 09/19/2016,10:10 AM

## 2016-09-19 NOTE — Progress Notes (Signed)
Called by RN to assess patient contraction pattern. CTX q3-6 minutes.  Patient currently feeling contractions as cramps. Ordered 500 ml bolus followed by 75 ml infusion; will consider terb if contractions do not resolve.

## 2016-09-19 NOTE — Progress Notes (Signed)
UR chart review completed.  

## 2016-09-19 NOTE — Progress Notes (Signed)
AP Note  Patient sleeping. Will come back later. No issues o/n, per staff. Likely keep 1wk s/p last bleed.   Cornelia Copaharlie Mairead Schwarzkopf, Jr MD Attending Center [facility] (Faculty Practice) 09/19/2016 Time: [phone]

## 2016-09-20 MED ORDER — LACTATED RINGERS IV BOLUS (SEPSIS)
500.0000 mL | Freq: Once | INTRAVENOUS | Status: AC
  Administered 2016-09-19: 500 mL via INTRAVENOUS

## 2016-09-20 MED ORDER — LACTATED RINGERS IV SOLN
INTRAVENOUS | Status: DC
  Administered 2016-09-20: 03:00:00 via INTRAVENOUS

## 2016-09-20 MED ORDER — DIPHENHYDRAMINE HCL 25 MG PO CAPS
25.0000 mg | ORAL_CAPSULE | Freq: Once | ORAL | Status: AC
  Administered 2016-09-20: 25 mg via ORAL
  Filled 2016-09-20: qty 1

## 2016-09-20 MED ORDER — NIFEDIPINE ER OSMOTIC RELEASE 30 MG PO TB24
30.0000 mg | ORAL_TABLET | Freq: Every day | ORAL | Status: DC
  Administered 2016-09-20 – 2016-09-25 (×6): 30 mg via ORAL
  Filled 2016-09-20 (×6): qty 1

## 2016-09-20 NOTE — Progress Notes (Signed)
MD notified that patient was suddenly crying loudly from her room, Rn evaluating patient and she stating multiple times that she "didn't want to talk about it", she requested medication to help her sleep so she does not have to talk or think about it stating that it was personal and that "she is losing her family" but continues to state that she doesn't want to talk about it. She denies that she wants to harm herself, she only wants to sleep now. Orders for benadryl to assist with rest received

## 2016-09-20 NOTE — Progress Notes (Signed)
Patient ID: Katie Pope, female   DOB: 03-15-1981, 36 y.o.   MRN: [id number]  FACULTY PRACTICE ANTEPARTUM NOTE  Katie Pope is a 36 y.o. G2P1001 at 4424w1d  who is admitted for vaginal bleeding.   Fetal presentation is unsure. Length of Stay:  2  Days  Subjective: Reports no active bleeding on pad. Continues to have intermittent contractions - currently contracting. Rates pain as mild to moderate. Patient reports good fetal movement.   She reports no uterine contractions She reports no loss of fluid per vagina.  Vitals:  Blood pressure 95/65, pulse (!) 55, temperature 97.7 F (36.5 C), temperature source Oral, resp. rate 18, height 5\' 6"  (1.676 m), weight 138 lb (62.6 kg), last menstrual period 03/03/2016, SpO2 96 %. Physical Examination:  General appearance - alert, well appearing, and in no distress Heart: regular rate, no murmur Lungs: clear to auscultation bilaterally, no wheezing. Fundal Height:  size equals dates Extremities: extremities normal, atraumatic, no cyanosis or edema  Membranes:intact  Fetal Monitoring:  Baseline: 135 bpm, Variability: Good {> 6 bpm), Accelerations: Reactive and Decelerations: Absent  Labs:  No results found for this or any previous visit (from the past 24 hour(s)).  Imaging Studies:       Medications:  Scheduled . pantoprazole  20 mg Oral Daily  . prenatal multivitamin  1 tablet Oral Q1200  . sertraline  50 mg Oral Daily   I have reviewed the patient's current medications.  ASSESSMENT: Active Problems:   AMA (advanced maternal age) multigravida 35+   Preterm contractions   Vaginal bleeding in pregnancy, third trimester   PLAN: 1. Vaginal bleeding  S/p BMZ (5/26, 5/27)  Reactive NST x3  No further bleeding (last bleed yesterday) 2. Preterm Contractions  Procardia XL 30mg  3. AMA  No further testing needed Continue routine antenatal care.   Levie HeritageStinson, Sloka Volante J, DO 09/20/2016,7:26 AM

## 2016-09-21 DIAGNOSIS — Z3A33 33 weeks gestation of pregnancy: Secondary | ICD-10-CM

## 2016-09-21 DIAGNOSIS — O4693 Antepartum hemorrhage, unspecified, third trimester: Secondary | ICD-10-CM

## 2016-09-21 LAB — TYPE AND SCREEN
ABO/RH(D): O POS
Antibody Screen: NEGATIVE

## 2016-09-21 NOTE — Progress Notes (Signed)
FACULTY PRACTICE ANTEPARTUM(COMPREHENSIVE) NOTE  Katie Pope is a 36 y.o. G2P1001 at 4932w2d  who is admitted for TRD TRI BLEEDING.   Fetal presentation is cephalic. Length of Stay:  3  Days  Subjective: PT noted only slight PINK d/c unchanged pt is very NONVERBAL.  STAFF reports that pt is upset over "family issues" but FOB appears supportive to nursing. Patient reports the fetal movement as active. Patient reports uterine contraction  activity as none. Patient reports  vaginal bleeding as none. Patient describes fluid per vagina as None.  Vitals:  Blood pressure (!) 104/57, pulse 79, temperature 98.2 F (36.8 C), temperature source Oral, resp. rate 20, height 5\' 6"  (1.676 m), weight 62.6 kg (138 lb), last menstrual period 03/03/2016, SpO2 100 %. Physical Examination:  General appearance - uncooperative Heart - normal rate and regular rhythm Abdomen - soft, nontender, nondistended Fundal Height:  size equals dates Cervical Exam: Not evaluated. and fetal presentation is cephalic. Extremities: extremities normal, atraumatic, no cyanosis or edema and Homans sign is negative, no sign of DVT with DTRs 2+ bilaterally Membranes:intact  Fetal Monitoring:  Baseline: 140 bpm, Variability: Good {> 6 bpm), Accelerations: Reactive and Decelerations: Absent  Labs:  No results found for this or any previous visit (from the past 24 hour(s)).  Imaging Studies:     Currently EPIC will not allow sonographic studies to automatically populate into notes.  In the meantime, copy and paste results into note or free text.  Medications:  Scheduled . NIFEdipine  30 mg Oral Daily  . pantoprazole  20 mg Oral Daily  . prenatal multivitamin  1 tablet Oral Q1200  . sertraline  50 mg Oral Daily   I have reviewed the patient's current medications.  ASSESSMENT: Patient Active Problem List   Diagnosis Date Noted  . Vaginal bleeding in pregnancy, third trimester 09/18/2016  . Preterm contractions  09/17/2016  . Vaginal bleeding during pregnancy 09/16/2016  . 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 Nausea and vomiting during pregnancy 06/22/2016  . AMA (advanced maternal age) multigravida 35+ 05/11/2016  . Supervision of normal pregnancy 04/13/2016    PLAN: *PLAN: 1. Vaginal bleeding  S/p BMZ (5/26, 5/27)  Reactive NST x3  No further bleeding (last bleed yesterday) 2. Preterm Contractions  Procardia XL 30mg  3. AMA  No further testing needed Continue routine antenatal care. Social work consult to see if pt has concerns that she'll verbalizeTilda Burrow**  Trinadee Verhagen V 09/21/2016,9:44 AM    Patient ID: Katie SleightJennifer D Pope, female   DOB: 1980-05-16, 36 y.o.   MRN: [id number]

## 2016-09-21 NOTE — H&P (Addendum)
History   CSN: [id number]  Arrival date and time: 09/18/16 1916   None        Chief Complaint  Patient presents with  . Vaginal Bleeding   HPI   Katie Pope is a 36 y.o. female G2P1001 @ [redacted]w[redacted]d here in MAU with continued vaginal bleeding.  She was seen on Saturday for this same complaint and was admitted to the hospital. She received magnesium and betamethasone. She was discharged home last night. + fetal movement. Bleeding is stringy and mucus and bright red with clots.  + Lower abdominal cramping.           OB History    Gravida Para Term Preterm AB Living   2 1 1     1    SAB TAB Ectopic Multiple Live Births                      Past Medical History:  Diagnosis Date  . Asthma   . Bronchitis   . Scoliosis          Past Surgical History:  Procedure Laterality Date  . NO PAST SURGERIES           Family History  Problem Relation Age of Onset  . Diabetes Mother           Social History  Substance Use Topics  . Smoking status: Former Smoker    Quit date: 12/23/2011  . Smokeless tobacco: Former Neurosurgeon  . Alcohol use No     Comment: occ    Allergies:       Allergies  Allergen Reactions  . Other Rash    seafood  . Rocephin [Ceftriaxone Sodium In Dextrose] Rash           Prescriptions Prior to Admission  Medication Sig Dispense Refill Last Dose  . cetirizine (ZYRTEC) 10 MG tablet Take 10 mg by mouth daily.   09/15/2016 at Unknown time  . ondansetron (ZOFRAN ODT) 4 MG disintegrating tablet Take 1 tablet (4 mg total) by mouth every 6 (six) hours as needed for nausea. 20 tablet 1 09/15/2016 at Unknown time  . pantoprazole (PROTONIX) 40 MG tablet Take 1 tablet (40 mg total) by mouth daily. 30 tablet 1 09/15/2016 at Unknown time  . Prenat-FeFmCb-DSS-FA-DHA w/o A (CITRANATAL HARMONY) 27-1-260 MG CAPS Take 1 capsule by mouth daily before breakfast. 90 capsule 3 09/15/2016 at Unknown time  . sertraline (ZOLOFT) 50 MG  tablet Take 1 tablet (50 mg total) by mouth daily. 30 tablet 2 09/15/2016 at Unknown time   Lab Results Last 48 Hours       Results for orders placed or performed during the hospital encounter of 09/18/16 (from the past 48 hour(s))  Urinalysis, Routine w reflex microscopic     Status: Abnormal   Collection Time: 09/18/16  7:22 PM  Result Value Ref Range   Color, Urine YELLOW YELLOW   APPearance HAZY (A) CLEAR   Specific Gravity, Urine 1.028 1.005 - 1.030   pH 6.0 5.0 - 8.0   Glucose, UA NEGATIVE NEGATIVE mg/dL   Hgb urine dipstick SMALL (A) NEGATIVE   Bilirubin Urine NEGATIVE NEGATIVE   Ketones, ur NEGATIVE NEGATIVE mg/dL   Protein, ur 30 (A) NEGATIVE mg/dL   Nitrite NEGATIVE NEGATIVE   Leukocytes, UA NEGATIVE NEGATIVE   RBC / HPF 0-5 0 - 5 RBC/hpf   WBC, UA 0-5 0 - 5 WBC/hpf   Bacteria, UA NONE SEEN NONE SEEN   Squamous Epithelial /  LPF 6-30 (A) NONE SEEN   Mucous PRESENT       Review of Systems  Cardiovascular: Negative.   Gastrointestinal: Positive for abdominal pain.   Physical Exam   Blood pressure 110/67, pulse 67, temperature 98.4 F (36.9 C), temperature source Oral, resp. rate 16, height 5\' 6"  (1.676 m), weight 138 lb (62.6 kg), last menstrual period 03/03/2016, SpO2 100 %.  Physical Exam  Constitutional: She is oriented to person, place, and time. She appears well-developed and well-nourished. No distress.  HENT:  Head: Normocephalic.  Eyes: Pupils are equal, round, and reactive to light.  GI: Soft. She exhibits no distension. There is no tenderness. There is no rebound.  Genitourinary:  Genitourinary Comments: Vagina - Small- moderate amount of pink vaginal discharge Cervix - No contact bleeding, no active bleeding, small amount of pink discharge oozing from os.  Bimanual exam: Cervix: fingertip-1cm, thick, soft, posterior  Chaperone present for exam.   Musculoskeletal: Normal range of motion.  Neurological: She is alert and oriented  to person, place, and time.  Skin: Skin is warm. She is not diaphoretic.  Psychiatric: Her behavior is normal.   Fetal Tracing: Baseline: 130 bpm Variability: Moderate  Accelerations: 15x15 Decelerations: None Toco: Occasional UI   MAU Course  Procedures  None  MDM  Discussed patient with Dr. Vergie LivingPickens Will admit X 7 days. CBC  Assessment and Plan   A:  1. Vaginal bleeding in pregnancy, third trimester   2. Abdominal cramping     P:  Admit to Ante per Dr. Vergie LivingPickens  Routine orders    Katie Pope, Katie RutherfordJennifer I, NP 09/18/2016 8:59 PM  Attestation of Attending Supervision of Nurse Practitioner: Evaluation and management procedures were performed by the NP under my supervision.  Pope have seen and examined the patient,  reviewed the NP's note and chart, and Pope agree with the management and plan.   Cornelia Copaharlie Micah Barnier, Jr MD Attending Center for [facility] Mid[facility](Faculty Practice)

## 2016-09-22 MED ORDER — GLYCERIN (LAXATIVE) 2.1 G RE SUPP
1.0000 | Freq: Once | RECTAL | Status: DC
  Filled 2016-09-22: qty 1

## 2016-09-22 NOTE — Progress Notes (Signed)
UR chart review completed.  

## 2016-09-22 NOTE — Progress Notes (Signed)
CSW met with patient to attempt to complete a clinical assessment.  When CSW arrive, patient was resting in bed watching TV.  Patient was polite, easy to engage, and receptive to meeting with CSW. CSW explained CSW's role an inquired about patient's recent emotions, thoughts and feelings. Patient reported being emotional while impatient and communicated the issue has resolved itself.  Patient reported was not interested in addressing patient's recent emotions and reported  that patient has a therapist at [facility]. Patient stated that patient had to cancel appointment scheduled for yesterday (09/21/16) because patient was inpatient.  Patient informed CSW that patient appointment has been rescheduled for Thursday, June 7th.  CSW praised patient for being proactive and seeking outpatient services to address patient's mental health needs.   Patient denied SI, HI, and DV.  Patient was provided with CSW's contact information and was encouraged to contact CSW if a need arise.   Laurey Arrow, MSW, LCSW Clinical Social Work [phone]

## 2016-09-22 NOTE — Progress Notes (Signed)
Patient ID: Katie Pope, female   DOB: July 28, 1980, 36 y.o.   MRN: [id number] FACULTY PRACTICE ANTEPARTUM(COMPREHENSIVE) NOTE  Katie SleightJennifer D Zwahlen is a 36 y.o. G2P1001 at 2423w3d by best clinical estimate who is admitted for vaginal bleeding.   Fetal presentation is cephalic. Length of Stay:  4  Days  Subjective: Light pink spotting only, having irregular contractions Patient reports the fetal movement as active. Patient reports uterine contraction  activity as none. Patient reports  vaginal bleeding as none. Patient describes fluid per vagina as None.  Vitals:  Blood pressure 104/66, pulse 66, temperature 98 F (36.7 C), temperature source Oral, resp. rate 18, height 5\' 6"  (1.676 m), weight 138 lb (62.6 kg), last menstrual period 03/03/2016, SpO2 100 %. Physical Examination:  General appearance - alert, well appearing, and in no distress Chest - normal effort Abdomen - gravid, NT Fundal Height:  size equals dates Extremities: Homans sign is negative, no sign of DVT  Membranes:intact  Fetal Monitoring:  Baseline: 140 bpm, Variability: Good {> 6 bpm), Accelerations: Reactive and Decelerations: Absent  Labs:  Results for orders placed or performed during the hospital encounter of 09/18/16 (from the past 24 hour(s))  Type and screen [facility]   Collection Time: 09/21/16  7:24 PM  Result Value Ref Range   ABO/RH(D) O POS    Antibody Screen NEG    Sample Expiration 09/24/2016     Medications:  Scheduled . NIFEdipine  30 mg Oral Daily  . pantoprazole  20 mg Oral Daily  . prenatal multivitamin  1 tablet Oral Q1200  . sertraline  50 mg Oral Daily   I have reviewed the patient's current medications.  ASSESSMENT: Active Problems:   AMA (advanced maternal age) multigravida 35+   Preterm contractions   Vaginal bleeding in pregnancy, third trimester   PLAN: Here x 1 wk post acute bleed Plan of care reviewed PTL precautions given  Reva Boresanya S Kelan Pritt,  MD 09/22/2016,9:50 AM

## 2016-09-23 LAB — CBC
HCT: 35 % — ABNORMAL LOW (ref 36.0–46.0)
Hemoglobin: 12.1 g/dL (ref 12.0–15.0)
MCH: 31 pg (ref 26.0–34.0)
MCHC: 34.6 g/dL (ref 30.0–36.0)
MCV: 89.7 fL (ref 78.0–100.0)
PLATELETS: 191 10*3/uL (ref 150–400)
RBC: 3.9 MIL/uL (ref 3.87–5.11)
RDW: 12.9 % (ref 11.5–15.5)
WBC: 12 10*3/uL — ABNORMAL HIGH (ref 4.0–10.5)

## 2016-09-23 NOTE — Progress Notes (Signed)
Patient ID: Katie SleightJennifer D Guess, female   DOB: 1981/04/14, 36 y.o.   MRN: [id number] ACULTY PRACTICE ANTEPARTUM COMPREHENSIVE PROGRESS NOTE  Katie Pope is a 36 y.o. G2P1001 at 7814w4d  who is admitted for vaginal bleeding. This is her second episode during this pregnancy.  Fetal presentation is cephalic. Length of Stay:  5  Days  Subjective: Pt reports some light pink spotting at times. Denies ut ctx this morning. + FM.    Vitals:  Blood pressure 102/62, pulse 62, temperature 97.9 F (36.6 C), temperature source Oral, resp. rate 16, height 5\' 6"  (1.676 m), weight 62.6 kg (138 lb), last menstrual period 03/03/2016, SpO2 98 %.   Physical Examination: Lungs clear  Heart RRR Abd soft + BS gravid non tender Ext non tender  Fetal Monitoring:  Baseline: 140's, good variability, accels, reactive bpm  Labs:  No results found for this or any previous visit (from the past 24 hour(s)).  Imaging Studies:       Medications:  Scheduled . Glycerin (Adult)  1 suppository Rectal Once  . NIFEdipine  30 mg Oral Daily  . pantoprazole  20 mg Oral Daily  . prenatal multivitamin  1 tablet Oral Q1200  . sertraline  50 mg Oral Daily   I have reviewed the patient's current medications.  ASSESSMENT: Patient Active Problem List   Diagnosis Date Noted  . Vaginal bleeding in pregnancy, third trimester 09/18/2016  . Preterm contractions 09/17/2016  . Vaginal bleeding during pregnancy 09/16/2016  . 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 Nausea and vomiting during pregnancy 06/22/2016  . AMA (advanced maternal age) multigravida 35+ 05/11/2016  . Supervision of normal pregnancy 04/13/2016    PLAN: Stable. No active bleeding. Will continue to observe for 7 days.     Hermina StaggersMichael L Duff Pozzi 09/23/2016,7:00 AM

## 2016-09-23 NOTE — Plan of Care (Signed)
Problem: Bowel/Gastric: Goal: Will not experience complications related to bowel motility Outcome: Completed/Met Date Met: 09/23/16 Colace 100 mg twice daily PRN and Glycerin Suppository.

## 2016-09-24 LAB — TYPE AND SCREEN
ABO/RH(D): O POS
ANTIBODY SCREEN: NEGATIVE

## 2016-09-24 MED ORDER — AMPICILLIN SODIUM 2 G IJ SOLR
2.0000 g | Freq: Four times a day (QID) | INTRAMUSCULAR | Status: DC
  Administered 2016-09-24 – 2016-09-26 (×6): 2 g via INTRAVENOUS
  Filled 2016-09-24 (×8): qty 2000

## 2016-09-24 MED ORDER — AZITHROMYCIN 250 MG PO TABS
1000.0000 mg | ORAL_TABLET | Freq: Once | ORAL | Status: AC
  Administered 2016-09-24: 1000 mg via ORAL
  Filled 2016-09-24: qty 4

## 2016-09-24 NOTE — Progress Notes (Signed)
FACULTY PRACTICE ANTEPARTUM(COMPREHENSIVE) NOTE  Katie SleightJennifer D Pope is a 36 y.o. G2P1001 at 5932w5d who is admitted for vaginal bleeding.   Fetal presentation is cephalic. Length of Stay:  6  Days  Subjective: Scant spotting Patient reports the fetal movement as active. Patient reports uterine contraction  activity as none. Patient reports  vaginal bleeding as scant staining. Patient describes fluid per vagina as None.  Vitals:  Blood pressure (!) 92/49, pulse 61, temperature 98 F (36.7 C), temperature source Oral, resp. rate 18, height 5\' 6"  (1.676 m), weight 62.6 kg (138 lb), last menstrual period 03/03/2016, SpO2 100 %. Physical Examination:  General appearance - alert, well appearing, and in no distress Heart - normal rate and regular rhythm Abdomen - soft, nontender, nondistended Fundal Height:  size equals dates Cervical Exam: Not evaluated.  Extremities: extremities normal, atraumatic, no cyanosis or edema and Homans sign is negative, no sign of DVT with DTRs 2+ bilaterally Membranes:intact  Fetal Monitoring:   Fetal Heart Rate A  Mode External filed at 09/23/2016 2240  Baseline Rate (A) 140 bpm filed at 09/23/2016 2240  Variability 6-25 BPM filed at 09/23/2016 2240  Accelerations 15 x 15 filed at 09/23/2016 2240  Decelerations None filed at 09/23/2016 2240     Labs:  No results found for this or any previous visit (from the past 24 hour(s)).  .  Medications:  Scheduled . Glycerin (Adult)  1 suppository Rectal Once  . NIFEdipine  30 mg Oral Daily  . pantoprazole  20 mg Oral Daily  . prenatal multivitamin  1 tablet Oral Q1200  . sertraline  50 mg Oral Daily   I have reviewed the patient's current medications.  ASSESSMENT: Patient Active Problem List   Diagnosis Date Noted  . Vaginal bleeding in pregnancy, third trimester 09/18/2016  . Preterm contractions 09/17/2016  . Vaginal bleeding during pregnancy 09/16/2016  . Depression affecting pregnancy in third  trimester, antepartum 09/14/2016  . Nausea and vomiting during pregnancy 06/22/2016  . AMA (advanced maternal age) multigravida 35+ 05/11/2016  . Supervision of normal pregnancy 04/13/2016    PLAN: Stable. No active bleeding. Will continue to observe for 7 days.  Scheryl DarterJames Preesha Benjamin 09/24/2016,7:40 AM

## 2016-09-24 NOTE — Progress Notes (Signed)
Patient called out said she felt a gush of fluid.  Her pad was saturated with clear fluid.

## 2016-09-24 NOTE — Progress Notes (Signed)
Called to evaluate patient with leakage of fluid. Patient reports good fetal movement and denies any contractions. She reports light pink spotting  SSE: wet perineum, pooling of clear fluid in vaginal vault with a small clot at external os. External os appears to be closed  A/P 36 yo G2P1 at 6924w5d with PPROM - Will start latency antibiotics - Will monitor for si/sx of chorioamnionitis - plan for induction of labor at 34 weeks - Plan explained to the patient. All her questions were answered

## 2016-09-24 NOTE — Progress Notes (Signed)
Pt c/o insomnia, Ambien 5 mg po administered. We will continue to monitor.

## 2016-09-25 DIAGNOSIS — O42919 Preterm premature rupture of membranes, unspecified as to length of time between rupture and onset of labor, unspecified trimester: Secondary | ICD-10-CM

## 2016-09-25 LAB — CBC
HCT: 36.6 % (ref 36.0–46.0)
HEMOGLOBIN: 12.7 g/dL (ref 12.0–15.0)
MCH: 30.7 pg (ref 26.0–34.0)
MCHC: 34.7 g/dL (ref 30.0–36.0)
MCV: 88.4 fL (ref 78.0–100.0)
PLATELETS: 199 10*3/uL (ref 150–400)
RBC: 4.14 MIL/uL (ref 3.87–5.11)
RDW: 12.7 % (ref 11.5–15.5)
WBC: 11.9 10*3/uL — AB (ref 4.0–10.5)

## 2016-09-25 MED ORDER — LACTATED RINGERS IV SOLN
INTRAVENOUS | Status: DC
  Administered 2016-09-25: 02:00:00 via INTRAVENOUS

## 2016-09-25 NOTE — Progress Notes (Addendum)
Daily Antepartum Note  Admission Date: 09/18/2016 Current Date: 09/25/2016 10:51 AM  Katie Pope is a 36 y.o. G2P1001 @ [redacted]w[redacted]d, HD#7, admitted for VB and then patient PPROM at 33/5.  Pregnancy complicated by: Patient Active Problem List   Diagnosis Date Noted  . Vaginal bleeding in pregnancy, third trimester 09/18/2016  . Preterm contractions 09/17/2016  . Vaginal bleeding during pregnancy 09/16/2016  . 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 Nausea and vomiting during pregnancy 06/22/2016  . AMA (advanced maternal age) multigravida 35+ 05/11/2016  . Supervision of normal pregnancy 04/13/2016    Overnight/24hr events:  PPROM confirmed  Subjective:  No s/s of PTL, infection or decreased FM  Objective:    Current Vital Signs 24h Vital Sign Ranges  T 98.3 F (36.8 C) Temp  Avg: 98.1 F (36.7 C)  Min: 97.7 F (36.5 C)  Max: 98.6 F (37 C)  BP 100/67 BP  Min: 93/56  Max: 106/62  HR 74 Pulse  Avg: 74.3  Min: 68  Max: 78  RR 18 Resp  Avg: 18  Min: 18  Max: 18  SaO2 100 % Not Delivered SpO2  Avg: 98 %  Min: 96 %  Max: 100 %       24 Hour I/O Current Shift I/O  Time Ins Outs 06/03 0701 - 06/04 0700 In: 250 [P.O.:120; I.V.:80] Out: -  No intake/output data recorded.   Physical exam: General: Well nourished, well developed female in no acute distress. Abdomen: gravid nttp Cardiovascular: S1, S2 normal, no murmur, rub or gallop, regular rate and rhythm Respiratory: CTAB Extremities: no clubbing, cyanosis or edema Skin: Warm and dry.   Medications: Current Facility-Administered Medications  Medication Dose Route Frequency Provider Last Rate Last Dose  . acetaminophen (TYLENOL) tablet 650 mg  650 mg Oral Q4H PRN Rasch, Victorino Dike I, NP   650 mg at 09/22/16 2207  . ampicillin (OMNIPEN) 2 g in sodium chloride 0.9 % 50 mL IVPB  2 g Intravenous Q6H Constant, Peggy, MD 150 mL/hr at 09/25/16 0825 2 g at 09/25/16 0825  . calcium carbonate  (TUMS - dosed in mg elemental calcium) chewable tablet 400 mg of elemental calcium  2 tablet Oral Q4H PRN Rasch, Victorino Dike I, NP      . docusate sodium (COLACE) capsule 100 mg  100 mg Oral BID PRN [place] Bing, MD   100 mg at 09/25/16 0925  . Glycerin (Adult) 2.1 g suppository 1 suppository  1 suppository Rectal Once Hermina Staggers, MD      . lactated ringers infusion   Intravenous Continuous Constant, Peggy, MD 20 mL/hr at 09/25/16 0200    . NIFEdipine (PROCARDIA-XL/ADALAT-CC/NIFEDICAL-XL) 24 hr tablet 30 mg  30 mg Oral Daily Levie Heritage, DO   30 mg at 09/25/16 1610  . pantoprazole (PROTONIX) EC tablet 20 mg  20 mg Oral Daily [place] Bing, MD   20 mg at 09/25/16 0925  . prenatal multivitamin tablet 1 tablet  1 tablet Oral Q1200 Rasch, Victorino Dike I, NP   1 tablet at 09/24/16 2158  . sertraline (ZOLOFT) tablet 50 mg  50 mg Oral Daily [place] Bing, MD   50 mg at 09/24/16 2158  . zolpidem (AMBIEN) tablet 5 mg  5 mg Oral QHS PRN Rasch, Victorino Dike I, NP   5 mg at 09/24/16 2334    Labs:  No new labs  Radiology: no new imaging  Assessment & Plan:  Pt stable *Pregnancy: continue routine EFM. Declines BTL. GBS  neg *PPROM: for IOL later tonight or tomorrow morning. D#2 of latency abx.Will u/s for presentation later tonight *Preterm: s/p BM Z on 5/26 and 5/27.  -5/29: 37%, 1861gm, normal AC, cephalic *depression: no issues on zoloft *AMA: neg mat21 and anatomy u/s.  *PPx: SCDs *FEN/GI: regular diet  Cornelia Copaharlie Donyea Gafford, Jr. MD Attending Center for [facility](Faculty Practice)

## 2016-09-26 ENCOUNTER — Encounter (HOSPITAL_COMMUNITY): Payer: Self-pay | Admitting: *Deleted

## 2016-09-26 DIAGNOSIS — O42919 Preterm premature rupture of membranes, unspecified as to length of time between rupture and onset of labor, unspecified trimester: Secondary | ICD-10-CM | POA: Diagnosis present

## 2016-09-26 DIAGNOSIS — Z3A34 34 weeks gestation of pregnancy: Secondary | ICD-10-CM

## 2016-09-26 LAB — RPR: RPR: NONREACTIVE

## 2016-09-26 LAB — CBC
HCT: 34.9 % — ABNORMAL LOW (ref 36.0–46.0)
Hemoglobin: 12 g/dL (ref 12.0–15.0)
MCH: 30.8 pg (ref 26.0–34.0)
MCHC: 34.4 g/dL (ref 30.0–36.0)
MCV: 89.7 fL (ref 78.0–100.0)
PLATELETS: 179 10*3/uL (ref 150–400)
RBC: 3.89 MIL/uL (ref 3.87–5.11)
RDW: 12.8 % (ref 11.5–15.5)
WBC: 9.8 10*3/uL (ref 4.0–10.5)

## 2016-09-26 MED ORDER — SERTRALINE HCL 50 MG PO TABS: 50.0000 mg | ORAL_TABLET | Freq: Every day | ORAL | Status: DC

## 2016-09-26 MED ORDER — NALOXONE HCL 0.4 MG/ML IJ SOLN
INTRAMUSCULAR | Status: AC
  Filled 2016-09-26: qty 1

## 2016-09-26 MED ORDER — MISOPROSTOL 25 MCG QUARTER TABLET
25.0000 ug | ORAL_TABLET | ORAL | Status: DC | PRN
  Administered 2016-09-26: 25 ug via ORAL
  Filled 2016-09-26 (×2): qty 1

## 2016-09-26 MED ORDER — SODIUM CHLORIDE 0.9% FLUSH: 3.0000 mL | Freq: Two times a day (BID) | INTRAVENOUS | Status: DC

## 2016-09-26 MED ORDER — WITCH HAZEL-GLYCERIN EX PADS: 1.0000 "application " | MEDICATED_PAD | CUTANEOUS | Status: DC | PRN

## 2016-09-26 MED ORDER — SOD CITRATE-CITRIC ACID 500-334 MG/5ML PO SOLN: 30.0000 mL | ORAL | Status: DC | PRN

## 2016-09-26 MED ORDER — SODIUM CHLORIDE 0.9% FLUSH: 3.0000 mL | INTRAVENOUS | Status: DC | PRN

## 2016-09-26 MED ORDER — DIPHENHYDRAMINE HCL 25 MG PO CAPS: 25.0000 mg | ORAL_CAPSULE | Freq: Four times a day (QID) | ORAL | Status: DC | PRN

## 2016-09-26 MED ORDER — ZOLPIDEM TARTRATE 5 MG PO TABS: 5.0000 mg | ORAL_TABLET | Freq: Every evening | ORAL | Status: DC | PRN

## 2016-09-26 MED ORDER — ONDANSETRON HCL 4 MG/2ML IJ SOLN: 4.0000 mg | INTRAMUSCULAR | Status: DC | PRN

## 2016-09-26 MED ORDER — LACTATED RINGERS IV SOLN
INTRAVENOUS | Status: DC
  Administered 2016-09-26 (×3): via INTRAVENOUS

## 2016-09-26 MED ORDER — ONDANSETRON HCL 4 MG/2ML IJ SOLN: 4.0000 mg | Freq: Four times a day (QID) | INTRAMUSCULAR | Status: DC | PRN

## 2016-09-26 MED ORDER — LACTATED RINGERS IV SOLN: 500.0000 mL | INTRAVENOUS | Status: DC | PRN

## 2016-09-26 MED ORDER — FLEET ENEMA 7-19 GM/118ML RE ENEM: 1.0000 | ENEMA | Freq: Every day | RECTAL | Status: DC | PRN

## 2016-09-26 MED ORDER — OXYCODONE-ACETAMINOPHEN 5-325 MG PO TABS: 1.0000 | ORAL_TABLET | ORAL | Status: DC | PRN

## 2016-09-26 MED ORDER — OXYCODONE-ACETAMINOPHEN 5-325 MG PO TABS: 2.0000 | ORAL_TABLET | ORAL | Status: DC | PRN

## 2016-09-26 MED ORDER — ONDANSETRON HCL 4 MG PO TABS: 4.0000 mg | ORAL_TABLET | ORAL | Status: DC | PRN

## 2016-09-26 MED ORDER — DIBUCAINE 1 % RE OINT: 1.0000 "application " | TOPICAL_OINTMENT | RECTAL | Status: DC | PRN

## 2016-09-26 MED ORDER — FENTANYL CITRATE (PF) 100 MCG/2ML IJ SOLN
100.0000 ug | INTRAMUSCULAR | Status: DC | PRN
  Administered 2016-09-26: 100 ug via INTRAVENOUS
  Filled 2016-09-26: qty 2

## 2016-09-26 MED ORDER — SODIUM CHLORIDE 0.9 % IV SOLN: 250.0000 mL | INTRAVENOUS | Status: DC | PRN

## 2016-09-26 MED ORDER — IBUPROFEN 600 MG PO TABS
600.0000 mg | ORAL_TABLET | Freq: Four times a day (QID) | ORAL | Status: DC
  Administered 2016-09-26 – 2016-09-28 (×5): 600 mg via ORAL
  Filled 2016-09-26 (×5): qty 1

## 2016-09-26 MED ORDER — SENNOSIDES-DOCUSATE SODIUM 8.6-50 MG PO TABS
2.0000 | ORAL_TABLET | ORAL | Status: DC
  Administered 2016-09-27 (×2): 2 via ORAL
  Filled 2016-09-26 (×2): qty 2

## 2016-09-26 MED ORDER — TETANUS-DIPHTH-ACELL PERTUSSIS 5-2.5-18.5 LF-MCG/0.5 IM SUSP: 0.5000 mL | Freq: Once | INTRAMUSCULAR | Status: DC

## 2016-09-26 MED ORDER — BENZOCAINE-MENTHOL 20-0.5 % EX AERO
1.0000 "application " | INHALATION_SPRAY | CUTANEOUS | Status: DC | PRN
  Administered 2016-09-27: 1 via TOPICAL
  Filled 2016-09-26: qty 56

## 2016-09-26 MED ORDER — LIDOCAINE HCL (PF) 1 % IJ SOLN
30.0000 mL | INTRAMUSCULAR | Status: DC | PRN
  Filled 2016-09-26: qty 30

## 2016-09-26 MED ORDER — TERBUTALINE SULFATE 1 MG/ML IJ SOLN: 0.2500 mg | Freq: Once | INTRAMUSCULAR | Status: DC | PRN

## 2016-09-26 MED ORDER — OXYTOCIN 40 UNITS IN LACTATED RINGERS INFUSION - SIMPLE MED
1.0000 m[IU]/min | INTRAVENOUS | Status: DC
  Administered 2016-09-26: 2 m[IU]/min via INTRAVENOUS
  Filled 2016-09-26: qty 1000

## 2016-09-26 MED ORDER — OXYTOCIN BOLUS FROM INFUSION
500.0000 mL | Freq: Once | INTRAVENOUS | Status: AC
  Administered 2016-09-26: 500 mL via INTRAVENOUS

## 2016-09-26 MED ORDER — TERBUTALINE SULFATE 1 MG/ML IJ SOLN
0.2500 mg | Freq: Once | INTRAMUSCULAR | Status: DC | PRN
  Filled 2016-09-26: qty 1

## 2016-09-26 MED ORDER — ACETAMINOPHEN 325 MG PO TABS: 650.0000 mg | ORAL_TABLET | ORAL | Status: DC | PRN

## 2016-09-26 MED ORDER — BISACODYL 10 MG RE SUPP: 10.0000 mg | Freq: Every day | RECTAL | Status: DC | PRN

## 2016-09-26 MED ORDER — COCONUT OIL OIL
1.0000 | TOPICAL_OIL | Status: DC | PRN
Start: 2016-09-26 — End: 2016-09-28
  Administered 2016-09-27: 1 via TOPICAL
  Filled 2016-09-26: qty 120

## 2016-09-26 MED ORDER — PRENATAL MULTIVITAMIN CH
1.0000 | ORAL_TABLET | Freq: Every day | ORAL | Status: DC
  Administered 2016-09-27: 1 via ORAL
  Filled 2016-09-26: qty 1

## 2016-09-26 MED ORDER — OXYTOCIN 40 UNITS IN LACTATED RINGERS INFUSION - SIMPLE MED
2.5000 [IU]/h | INTRAVENOUS | Status: DC
  Administered 2016-09-26: 2.5 [IU]/h via INTRAVENOUS

## 2016-09-26 MED ORDER — SIMETHICONE 80 MG PO CHEW: 80.0000 mg | CHEWABLE_TABLET | ORAL | Status: DC | PRN

## 2016-09-26 MED ORDER — MEASLES, MUMPS & RUBELLA VAC ~~LOC~~ INJ
0.5000 mL | INJECTION | Freq: Once | SUBCUTANEOUS | Status: DC
  Filled 2016-09-26: qty 0.5

## 2016-09-26 NOTE — Progress Notes (Signed)
S: Patient seen & examined for progress of labor. Patient resting comfortably.  Dilation: 3.5 Effacement (%): 70 Cervical Position: Posterior Station: -2 Presentation: Vertex Exam by:: Jene EveryMelissa Lee, RN   FHT: 140 bpm, mod var, +accels, no decels TOCO: q563min   A/P: Labor: progressing with cytotec x1, last dose at 5am Pain: controlled with IV Meds FWB: Category I tracing   Continue expectant management Anticipate SVD

## 2016-09-26 NOTE — Progress Notes (Signed)
Patient ID: Katie SleightJennifer D Carlucci, female   DOB: December 28, 1980, 36 y.o.   MRN: [id number]  S: Patient seen & examined for progress of labor. Patient comfortable, states she began feeling stronger contractions about 1 hour ago but not requiring any pain medication.    O:  Vitals:   09/26/16 1800 09/26/16 1801 09/26/16 1831 09/26/16 1833  BP:  113/75 105/81 105/81  Pulse:  67 73 73  Resp: 17     Temp: 98.5 F (36.9 C)     TempSrc: Oral     SpO2:      Weight:      Height:        Dilation: 4 Effacement (%): 80 Cervical Position: Posterior Station: -3, -2 Presentation: Vertex Exam by:: Dr. Omer JackMumaw   FHT: 145bpm, mod var, +accels, no decels TOCO: q2-734min   A/P: Continue pitocin, recheck in 4 hours if no improvement, will place IUPC Continue expectant management Anticipate SVD

## 2016-09-26 NOTE — Progress Notes (Signed)
Pt being wheeled down to L&D. 

## 2016-09-26 NOTE — Progress Notes (Signed)
LABOR AND DELIVERY ADMISSION HISTORY AND PHYSICAL NOTE  Katie Pope is a 36 y.o. female G2P1001 with IUP at 2557w0d by 6 wk US presenting for vaginal bleeding and was admitted for observation. She had rupture of membranes on 6/3.   She reports positive fetal movement. She denies leakage of fluid or vaginal bleeding.  Prenatal History/Complications:  Past Medical History: Past Medical History:  Diagnosis Date  . Asthma   . Bronchitis   . Scoliosis     Past Surgical History: Past Surgical History:  Procedure Laterality Date  . NO PAST SURGERIES      Obstetrical History: OB History    Gravida Para Term Preterm AB Living   2 1 1     1    SAB TAB Ectopic Multiple Live Births                  Social History: Social History   Social History  . Marital status: Single    Spouse name: N/A  . Number of children: N/A  . Years of education: N/A   Social History Main Topics  . Smoking status: Former Smoker    Quit date: 12/23/2011  . Smokeless tobacco: Former NeurosurgeonUser  . Alcohol use No     Comment: occ  . Drug use: No  . Sexual activity: Yes   Other Topics Concern  . None   Social History Narrative  . None    Family History: Family History  Problem Relation Age of Onset  . Diabetes Mother     Allergies: Allergies  Allergen Reactions  . Other Rash    seafood  . Rocephin [Ceftriaxone Sodium In Dextrose] Rash    Prescriptions Prior to Admission  Medication Sig Dispense Refill Last Dose  . cetirizine (ZYRTEC) 10 MG tablet Take 10 mg by mouth daily.   Past Week at Unknown time  . ondansetron (ZOFRAN ODT) 4 MG disintegrating tablet Take 1 tablet (4 mg total) by mouth every 6 (six) hours as needed for nausea. 20 tablet 1 Past Week at Unknown time  . pantoprazole (PROTONIX) 40 MG tablet Take 1 tablet (40 mg total) by mouth daily. 30 tablet 1 09/19/2016 at Unknown time  . Prenat-FeFmCb-DSS-FA-DHA w/o A (CITRANATAL HARMONY) 27-1-260 MG CAPS Take 1 capsule by mouth  daily before breakfast. 90 capsule 3 09/18/2016 at Unknown time  . sertraline (ZOLOFT) 50 MG tablet Take 1 tablet (50 mg total) by mouth daily. 30 tablet 2 09/18/2016 at Unknown time     Review of Systems   All systems reviewed and negative except as stated in HPI  Blood pressure (!) 101/58, pulse 72, temperature 97.9 F (36.6 C), temperature source Oral, resp. rate 16, height 5\' 6"  (1.676 m), weight 138 lb (62.6 kg), last menstrual period 03/03/2016, SpO2 99 %. General appearance: alert, cooperative and appears stated age Lungs: clear to auscultation bilaterally Heart: regular rate and rhythm Abdomen: soft, non-tender; bowel sounds normal Extremities: No calf swelling or tenderness Presentation: cephalic by nursing exam Fetal monitoring: category 1 Uterine activity: contractions every 10 Dilation: 1.5 Effacement (%): Thick Station: -3 Exam by:: Edman CirclePenley, RN    Prenatal labs: ABO, Rh: --/--/O POS (06/03 1904) Antibody: NEG (06/03 1904) Rubella: immune RPR: Non Reactive (06/04 1358)  HBsAg: Negative (12/22 1101)  HIV: Non Reactive (04/26 1030)  GBS: Negative (05/26 0000)  2 hr Glucola: normal Genetic screening: NIPS normal Anatomy US: normal  Prenatal Transfer Tool  Maternal Diabetes: No Genetic Screening: Normal Maternal Ultrasounds/Referrals: Normal Fetal  Ultrasounds or other Referrals:  None Maternal Substance Abuse:  No Significant Maternal Medications:  None Significant Maternal Lab Results: Lab values include: Group B Strep negative  Results for orders placed or performed during the hospital encounter of 09/18/16 (from the past 24 hour(s))  CBC   Collection Time: 09/25/16  1:58 PM  Result Value Ref Range   WBC 11.9 (H) 4.0 - 10.5 K/uL   RBC 4.14 3.87 - 5.11 MIL/uL   Hemoglobin 12.7 12.0 - 15.0 g/dL   HCT 16.1 09.6 - 04.5 %   MCV 88.4 78.0 - 100.0 fL   MCH 30.7 26.0 - 34.0 pg   MCHC 34.7 30.0 - 36.0 g/dL   RDW 40.9 81.1 - 91.4 %   Platelets 199 150 - 400 K/uL   RPR   Collection Time: 09/25/16  1:58 PM  Result Value Ref Range   RPR Ser Ql Non Reactive Non Reactive    Patient Active Problem List   Diagnosis Date Noted  . Preterm premature rupture of membranes (PPROM) with unknown onset of labor 09/26/2016  . Vaginal bleeding in pregnancy, third trimester 09/18/2016  . Preterm contractions 09/17/2016  . Vaginal bleeding during pregnancy 09/16/2016  . 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 Nausea and vomiting during pregnancy 06/22/2016  . AMA (advanced maternal age) multigravida 35+ 05/11/2016  . Supervision of normal pregnancy 04/13/2016    Assessment: Katie Pope is a 36 y.o. G2P1001 at [redacted]w[redacted]d here for IOL for PPROM  #Labor: start with cytotec. After 4 hours will plan to progress to pitocin #Pain: Plans for epdiural #FWB: Category 1 #ID:  GBS negative #MOF: breast and bottle #MOC:undicided #Circ:  girl  Ernestina Penna 09/26/2016, 6:02 AM

## 2016-09-26 NOTE — Progress Notes (Signed)
OB note Late entry for 2300 on 6/4  Cephalic by BSUS with normal FHR SVE: 1-2/50/-3/soft/mid position  D/w pt likely cytotec IOL. Pt amenable to plan  Cornelia Copaharlie Sruthi Maurer, Jr MD Attending Center for [facility](Faculty Practice)

## 2016-09-26 NOTE — Anesthesia Pain Management Evaluation Note (Signed)
  CRNA Pain Management Visit Note  Patient: Katie SleightJennifer D Pope, 36 y.o., female  "[facility]. We have an anesthesia team available at all times to provide care throughout the hospital, including epidural management and anesthesia for C-section. I don't know your plan for the delivery whether it a natural birth, water birth, IV sedation, nitrous supplementation, doula or epidural, but we want to meet your pain goals."   1.Was your pain managed to your expectations on prior hospitalizations?   yes  2.What is your expectation for pain management during this hospitalization?     I pain meds  3.How can we help you reach that goal? IV pain meds  Record the patient's initial score and the patient's pain goal.   Pain: 5/10  Pain Goal: 0/10 [facility] wants you to be able to say your pain was always managed very well.  Salome ArntSterling, Peg Fifer Marie 09/26/2016

## 2016-09-27 LAB — RPR: RPR: NONREACTIVE

## 2016-09-27 MED ORDER — SERTRALINE HCL 50 MG PO TABS
50.0000 mg | ORAL_TABLET | Freq: Every day | ORAL | Status: DC
  Administered 2016-09-27: 50 mg via ORAL
  Filled 2016-09-27 (×2): qty 1

## 2016-09-27 NOTE — Progress Notes (Signed)
CSW acknowledges NICU admission.    Patient screened out for psychosocial assessment since none of the following apply:  Psychosocial stressors documented in mother or baby's chart  Gestation less than 32 weeks  Code at delivery   Infant with anomalies  Please contact the Clinical Social Worker if specific needs arise, or by MOB's request.    CSW received consult for Limited PNC and Hx of Depression.  Upon chart review, CSW notes more than 3 prenatal visits and depression being treated with counseling and medication.  CSW notes that patient met with a CSW on 09/22/16, appeared to be coping well with professional supports in place, and was not interested in discussing her feelings. 

## 2016-09-27 NOTE — Lactation Note (Signed)
This note was copied from a baby's chart. Lactation Consultation Note  Patient Name: Boy Katheren PullerJennifer Doody NUUVO'ZToday's Date: 09/27/2016 Reason for consult: Initial assessment;NICU baby  NICU baby 5217 hours old. Mom has just finished pumping and is ready to take EBM to NICU. Mom states that she does not feel like she is getting very much, but she is pumping at least every 3 hours, and massaging while pumping. Discussed progression of milk coming to volume and the need to pump at least 8 times/24 hours followed by hand expression. Discussed with mom that visiting the baby and offering STS and nuzzling at the breast as she and baby are able, can also increase EBM amounts. Mom given additional bottles and pen to label EBM bottles. Enc mom to call for assistance as needed.   Maternal Data Has patient been taught Hand Expression?: Yes Does the patient have breastfeeding experience prior to this delivery?: No  Feeding    LATCH Score/Interventions                      Lactation Tools Discussed/Used Pump Review: Setup, frequency, and cleaning;Milk Storage Initiated by:: Bedside RN Date initiated:: 09/26/16   Consult Status Consult Status: Follow-up Date: 09/28/16 Follow-up type: In-patient    Sherlyn HayJennifer D Kallee Nam 09/27/2016, 2:59 PM

## 2016-09-27 NOTE — Progress Notes (Signed)
Post Partum Day #1 Subjective: no complaints, up ad lib, voiding and tolerating PO  Objective: Blood pressure 125/70, pulse (!) 53, temperature 98 F (36.7 C), temperature source Oral, resp. rate 18, height 5\' 6"  (1.676 m), weight 138 lb (62.6 kg), last menstrual period 03/03/2016, SpO2 99 %.  Physical Exam:  General: alert, cooperative and no distress Lochia: appropriate Uterine Fundus: firm Incision: none DVT Evaluation: No evidence of DVT seen on physical exam. No cords or calf tenderness. No significant calf/ankle edema.   Recent Labs  09/25/16 1358 09/26/16 0934  HGB 12.7 12.0  HCT 36.6 34.9*    Assessment/Plan: Plan for discharge tomorrow, Breastfeeding, Lactation consult, Social Work consult and Contraception undecided.  Infant in NICU.   LOS: 9 days   Roe Coombsachelle A Annleigh Knueppel, CNM 09/27/2016, 8:45 AM

## 2016-09-27 NOTE — Progress Notes (Signed)
Mom states that she wants to breast feed infant that is in NICU. Initiated DEBP. Set-up, frequency, storage, and cleaning reviewed. Instructed to call for assistance as needed.

## 2016-09-28 ENCOUNTER — Encounter: Payer: Self-pay | Admitting: Obstetrics and Gynecology

## 2016-09-28 MED ORDER — IBUPROFEN 600 MG PO TABS: 600.0000 mg | ORAL_TABLET | Freq: Four times a day (QID) | ORAL | 2 refills | Status: DC

## 2016-09-28 MED ORDER — PNEUMOCOCCAL VAC POLYVALENT 25 MCG/0.5ML IJ INJ
0.5000 mL | INJECTION | INTRAMUSCULAR | Status: AC
  Administered 2016-09-28: 0.5 mL via INTRAMUSCULAR
  Filled 2016-09-28: qty 0.5

## 2016-09-28 NOTE — Progress Notes (Signed)
Post Partum Day #2 Subjective: up ad lib, voiding, tolerating PO and reports cracked and sore nipples from pumping.  Does not desire to continue pumping, has hand pump.  Encouragement given.  Discussed gel pads with RN.   Objective: Blood pressure 114/79, pulse 66, temperature 97.9 F (36.6 C), temperature source Oral, resp. rate 17, height 5\' 6"  (1.676 m), weight 138 lb (62.6 kg), last menstrual period 03/03/2016, SpO2 99 %.  Physical Exam:  General: alert, cooperative and no distress Lochia: appropriate Uterine Fundus: firm Incision: none DVT Evaluation: No evidence of DVT seen on physical exam. No cords or calf tenderness. No significant calf/ankle edema.   Recent Labs  09/25/16 1358 09/26/16 0934  HGB 12.7 12.0  HCT 36.6 34.9*    Assessment/Plan: Discharge home, Breastfeeding, Lactation consult, Social Work consult and Contraception ?IUD.  Did not do well with OCPs or Nuva Ring or Depo injections in the past.  Discussed IUD at length.  FOB was supposed to have vasectomy.  Has counseling appointment this afternoon.  States has been on Zoloft for about 2 weeks, denies any depression at the moment.  Desires to go home.  Infant doing well in NICU.  SW consult complete.  Lactation consult in process.    LOS: 10 days   Roe CoombsRachelle A Willia Genrich, CNM 09/28/2016, 7:27 AM

## 2016-09-28 NOTE — Discharge Summary (Signed)
OB Discharge Summary     Patient Name: Katie SleightJennifer D Blount DOB: Apr 24, 1981 MRN: [id number]  Date of admission: 09/18/2016 Delivering MD: Shawna ClampBOOKER, KIMBERLY R   Date of discharge: 09/28/2016  Admitting diagnosis: 32Wks Light Bleeding Intrauterine pregnancy: 5728w2d     Secondary diagnosis:  Active Problems:   AMA (advanced maternal age) multigravida 35+   Preterm contractions   Vaginal bleeding in pregnancy, third trimester   Preterm premature rupture of membranes (PPROM) with unknown onset of labor  Additional problems: Preterm delivery, Hx of Depression     Discharge diagnosis: Preterm Pregnancy Delivered and Depression                                                                                                Post partum procedures:none  Augmentation: Pitocin  Complications: ROM>24 hours  Hospital course:  Onset of Labor With Vaginal Delivery     36 y.o. yo G2P1001 at 2828w2d was admitted in premature labor with vaginal bleeding on 09/18/2016, PPROM on 09/24/16 and delivered on 09/26/16. Patient had an uncomplicated labor course as follows:  Membrane Rupture Time/Date: 6:20 PM ,09/24/2016   Intrapartum Procedures: Episiotomy: None [1]                                         Lacerations:  None [1]  Patient had a delivery of a Viable infant. 09/26/2016  Information for the patient's newborn:  Thurmond ButtsWimbley, Boy Anthonella [[id number]][[id number]]       Pateint had an uncomplicated postpartum course.  She is ambulating, tolerating a regular diet, passing flatus, and urinating well. Patient is discharged home in stable condition on 09/28/16.   Physical exam  Vitals:   09/27/16 0100 09/27/16 0635 09/27/16 1815 09/28/16 0629  BP: 99/61 125/70 116/74 114/79  Pulse: (!) 57 (!) 53 72 66  Resp: 17 18 18 17   Temp: 97.6 F (36.4 C) 98 F (36.7 C) 98.2 F (36.8 C) 97.9 F (36.6 C)  TempSrc: Oral Oral Oral Oral  SpO2:      Weight:      Height:       General: alert, cooperative and no distress Lochia:  appropriate Uterine Fundus: firm Incision: N/A DVT Evaluation: No evidence of DVT seen on physical exam. No cords or calf tenderness. No significant calf/ankle edema. Labs: Lab Results  Component Value Date   WBC 9.8 09/26/2016   HGB 12.0 09/26/2016   HCT 34.9 (L) 09/26/2016   MCV 89.7 09/26/2016   PLT 179 09/26/2016   CMP Latest Ref Rng & Units 08/04/2016  Glucose 65 - 99 mg/dL 96  BUN 6 - 20 mg/dL 5(L)  Creatinine 7.820.44 - 1.00 mg/dL 9.560.79  Sodium 213135 - 086145 mmol/L 135  Potassium 3.5 - 5.1 mmol/L 3.6  Chloride 101 - 111 mmol/L 107  CO2 22 - 32 mmol/L 20(L)  Calcium 8.9 - 10.3 mg/dL 9.1  Total Protein 6.5 - 8.1 g/dL 6.5  Total Bilirubin 0.3 - 1.2 mg/dL 0.6  Alkaline Phos 38 - 126  U/L 60  AST 15 - 41 U/L 26  ALT 14 - 54 U/L 21    Discharge instruction: per After Visit Summary and "Baby and Me Booklet".  After visit meds:  Allergies as of 09/28/2016      Reactions   Other Rash   seafood   Rocephin [ceftriaxone Sodium In Dextrose] Rash      Medication List    STOP taking these medications   cetirizine 10 MG tablet Commonly known as:  ZYRTEC   ondansetron 4 MG disintegrating tablet Commonly known as:  ZOFRAN ODT     TAKE these medications   CITRANATAL HARMONY 27-1-260 MG Caps Take 1 capsule by mouth daily before breakfast.   ibuprofen 600 MG tablet Commonly known as:  ADVIL,MOTRIN Take 1 tablet (600 mg total) by mouth every 6 (six) hours.   pantoprazole 40 MG tablet Commonly known as:  PROTONIX Take 1 tablet (40 mg total) by mouth daily.   sertraline 50 MG tablet Commonly known as:  ZOLOFT Take 1 tablet (50 mg total) by mouth daily.       Diet: routine diet  Activity: Advance as tolerated. Pelvic rest for 6 weeks.   Outpatient follow up:4 weeks Follow up Appt:Future Appointments Date Time Provider Department Center  09/28/2016 9:00 AM Constant, Gigi Gin, MD [facility] None   Follow up Visit:No Follow-up on file.  Postpartum contraception: IUD  Mirena  Newborn Data: Live born female  Birth Weight: 4 lb 8.7 oz (2060 g) APGAR: 8, 7  Baby Feeding: Bottle and Breast Disposition:NICU   09/28/2016 Roe Coombs, CNM

## 2016-11-02 ENCOUNTER — Ambulatory Visit (INDEPENDENT_AMBULATORY_CARE_PROVIDER_SITE_OTHER): Payer: Medicaid Other | Admitting: Obstetrics & Gynecology

## 2016-11-02 ENCOUNTER — Encounter: Payer: Self-pay | Admitting: Obstetrics & Gynecology

## 2016-11-02 MED ORDER — NORELGESTROMIN-ETH ESTRADIOL 150-35 MCG/24HR TD PTWK: 1.0000 | MEDICATED_PATCH | TRANSDERMAL | 12 refills | Status: DC

## 2016-11-02 NOTE — Progress Notes (Signed)
Post Partum Exam  Katie SleightJennifer D Pope is a 36 y.o. 342P1001 female who presents for a postpartum visit. She is 5 weeks postpartum following a spontaneous vaginal delivery. I have fully reviewed the prenatal and intrapartum course. The delivery was at 34 gestational weeks.  Anesthesia: none. Postpartum course has been good. Baby's course has been good. Baby is feeding by bottle - Similac Neosure. Bleeding thin lochia. Bowel function is normal. Bladder function is normal. Patient is not sexually active. Contraception method is none. Postpartum depression screening:neg  The following portions of the patient's history were reviewed and updated as appropriate: allergies, current medications, past family history, past medical history, past social history, past surgical history and problem list.  Review of Systems Pertinent items noted in HPI and remainder of comprehensive ROS otherwise negative.    Objective:  Last menstrual period 03/03/2016. Blood pressure 120/77, pulse 76, weight 135 lb (61.2 kg), last menstrual period 11/01/2016, not currently breastfeeding.  General:  alert, cooperative and no distress           Abdomen: soft, non-tender; bowel sounds normal; no masses,  no organomegaly   Vulva:  not evaluated  Vagina: not evaluated   Assessment:    normal postpartum exam. Pap smear not done at today's visit.   Plan:   1. Contraception: Ortho-Evra patches weekly 2. Discussed BCM at length 3. Follow up as needed.   Katie Pope, James G, MD 11/02/2016

## 2016-11-02 NOTE — Patient Instructions (Signed)
Contraception Choices Contraception (birth control) is the use of any methods or devices to prevent pregnancy. Below are some methods to help avoid pregnancy. Hormonal methods  Contraceptive implant. This is a thin, plastic tube containing progesterone hormone. It does not contain estrogen hormone. Your health care provider inserts the tube in the inner part of the upper arm. The tube can remain in place for up to 3 years. After 3 years, the implant must be removed. The implant prevents the ovaries from releasing an egg (ovulation), thickens the cervical mucus to prevent sperm from entering the uterus, and thins the lining of the inside of the uterus.  Progesterone-only injections. These injections are given every 3 months by your health care provider to prevent pregnancy. This synthetic progesterone hormone stops the ovaries from releasing eggs. It also thickens cervical mucus and changes the uterine lining. This makes it harder for sperm to survive in the uterus.  Birth control pills. These pills contain estrogen and progesterone hormone. They work by preventing the ovaries from releasing eggs (ovulation). They also cause the cervical mucus to thicken, preventing the sperm from entering the uterus. Birth control pills are prescribed by a health care provider.Birth control pills can also be used to treat heavy periods.  Minipill. This type of birth control pill contains only the progesterone hormone. They are taken every day of each month and must be prescribed by your health care provider.  Birth control patch. The patch contains hormones similar to those in birth control pills. It must be changed once a week and is prescribed by a health care provider.  Vaginal ring. The ring contains hormones similar to those in birth control pills. It is left in the vagina for 3 weeks, removed for 1 week, and then a new one is put back in place. The patient must be comfortable inserting and removing the ring from  the vagina.A health care provider's prescription is necessary.  Emergency contraception. Emergency contraceptives prevent pregnancy after unprotected sexual intercourse. This pill can be taken right after sex or up to 5 days after unprotected sex. It is most effective the sooner you take the pills after having sexual intercourse. Most emergency contraceptive pills are available without a prescription. Check with your pharmacist. Do not use emergency contraception as your only form of birth control. Barrier methods  Female condom. This is a thin sheath (latex or rubber) that is worn over the penis during sexual intercourse. It can be used with spermicide to increase effectiveness.  Female condom. This is a soft, loose-fitting sheath that is put into the vagina before sexual intercourse.  Diaphragm. This is a soft, latex, dome-shaped barrier that must be fitted by a health care provider. It is inserted into the vagina, along with a spermicidal jelly. It is inserted before intercourse. The diaphragm should be left in the vagina for 6 to 8 hours after intercourse.  Cervical cap. This is a round, soft, latex or plastic cup that fits over the cervix and must be fitted by a health care provider. The cap can be left in place for up to 48 hours after intercourse.  Sponge. This is a soft, circular piece of polyurethane foam. The sponge has spermicide in it. It is inserted into the vagina after wetting it and before sexual intercourse.  Spermicides. These are chemicals that kill or block sperm from entering the cervix and uterus. They come in the form of creams, jellies, suppositories, foam, or tablets. They do not require a prescription. They   are inserted into the vagina with an applicator before having sexual intercourse. The process must be repeated every time you have sexual intercourse. Intrauterine contraception  Intrauterine device (IUD). This is a T-shaped device that is put in a woman's uterus during  a menstrual period to prevent pregnancy. There are 2 types: ? Copper IUD. This type of IUD is wrapped in copper wire and is placed inside the uterus. Copper makes the uterus and fallopian tubes produce a fluid that kills sperm. It can stay in place for 10 years. ? Hormone IUD. This type of IUD contains the hormone progestin (synthetic progesterone). The hormone thickens the cervical mucus and prevents sperm from entering the uterus, and it also thins the uterine lining to prevent implantation of a fertilized egg. The hormone can weaken or kill the sperm that get into the uterus. It can stay in place for 3-5 years, depending on which type of IUD is used. Permanent methods of contraception  Female tubal ligation. This is when the woman's fallopian tubes are surgically sealed, tied, or blocked to prevent the egg from traveling to the uterus.  Hysteroscopic sterilization. This involves placing a small coil or insert into each fallopian tube. Your doctor uses a technique called hysteroscopy to do the procedure. The device causes scar tissue to form. This results in permanent blockage of the fallopian tubes, so the sperm cannot fertilize the egg. It takes about 3 months after the procedure for the tubes to become blocked. You must use another form of birth control for these 3 months.  Female sterilization. This is when the female has the tubes that carry sperm tied off (vasectomy).This blocks sperm from entering the vagina during sexual intercourse. After the procedure, the man can still ejaculate fluid (semen). Natural planning methods  Natural family planning. This is not having sexual intercourse or using a barrier method (condom, diaphragm, cervical cap) on days the woman could become pregnant.  Calendar method. This is keeping track of the length of each menstrual cycle and identifying when you are fertile.  Ovulation method. This is avoiding sexual intercourse during ovulation.  Symptothermal method.  This is avoiding sexual intercourse during ovulation, using a thermometer and ovulation symptoms.  Post-ovulation method. This is timing sexual intercourse after you have ovulated. Regardless of which type or method of contraception you choose, it is important that you use condoms to protect against the transmission of sexually transmitted infections (STIs). Talk with your health care provider about which form of contraception is most appropriate for you. This information is not intended to replace advice given to you by your health care provider. Make sure you discuss any questions you have with your health care provider. Document Released: 04/10/2005 Document Revised: 09/16/2015 Document Reviewed: 10/03/2012 Elsevier Interactive Patient Education  2017 [employer]  

## 2016-11-28 ENCOUNTER — Telehealth: Payer: Self-pay | Admitting: *Deleted

## 2016-11-28 NOTE — Telephone Encounter (Signed)
Pt called to office stating she is having bleeding with Patch. Pt states that bleeding started on the second week of patch and she is still bleeding. Pt states that this has happened with other BC in past. Pt is currently scheduled for IUD later this month but would like to know what to do about bleeding until then.  Pt made aware that message to be sent to provider for further recommendations.   Please advise.

## 2016-11-28 NOTE — Telephone Encounter (Signed)
Patient was prescribed the patch on 7/12. It is not uncommon for irregular bleeding to occur during the first month of trying a new birth control. She can finish her month supply of the patch until IUD insertion or she may stop using it and keep her IUD insertion appointment.  Katie Pope

## 2016-12-05 NOTE — Telephone Encounter (Signed)
Patient called to let us know she is still bleeding on the patch and wants to know what to do. Left message on voice mail with Dr Deretha Emoryonstant's recommendation. Advised to use BUM if she stops patch and she may get PNV or Fe supplement if she is feeling weak from the bleeding.

## 2016-12-18 ENCOUNTER — Other Ambulatory Visit: Payer: Self-pay | Admitting: Obstetrics & Gynecology

## 2016-12-18 ENCOUNTER — Ambulatory Visit: Payer: Self-pay | Admitting: Obstetrics and Gynecology

## 2016-12-18 DIAGNOSIS — F329 Major depressive disorder, single episode, unspecified: Secondary | ICD-10-CM

## 2016-12-18 DIAGNOSIS — O99343 Other mental disorders complicating pregnancy, third trimester: Principal | ICD-10-CM

## 2017-01-01 ENCOUNTER — Encounter: Payer: Self-pay | Admitting: Obstetrics and Gynecology

## 2017-01-01 ENCOUNTER — Ambulatory Visit (INDEPENDENT_AMBULATORY_CARE_PROVIDER_SITE_OTHER): Payer: Medicaid Other | Admitting: Obstetrics and Gynecology

## 2017-01-01 ENCOUNTER — Encounter: Payer: Self-pay | Admitting: *Deleted

## 2017-01-01 VITALS — BP 104/78 | HR 67 | Ht 65.5 in | Wt 130.7 lb

## 2017-01-01 DIAGNOSIS — Z3049 Encounter for surveillance of other contraceptives: Secondary | ICD-10-CM | POA: Diagnosis not present

## 2017-01-01 DIAGNOSIS — Z3202 Encounter for pregnancy test, result negative: Secondary | ICD-10-CM | POA: Diagnosis not present

## 2017-01-01 DIAGNOSIS — Z30017 Encounter for initial prescription of implantable subdermal contraceptive: Secondary | ICD-10-CM

## 2017-01-01 LAB — POCT URINE PREGNANCY: PREG TEST UR: NEGATIVE

## 2017-01-01 MED ORDER — ETONOGESTREL 68 MG ~~LOC~~ IMPL: 68.0000 mg | DRUG_IMPLANT | Freq: Once | SUBCUTANEOUS | Status: DC

## 2017-01-01 MED ORDER — ETONOGESTREL 68 MG ~~LOC~~ IMPL
68.0000 mg | DRUG_IMPLANT | Freq: Once | SUBCUTANEOUS | Status: AC
  Administered 2017-01-01: 68 mg via SUBCUTANEOUS

## 2017-01-01 NOTE — Progress Notes (Signed)
Pt currently on patch wishes to change to Nexplanon. She tends to forget to change patches, it itches, and it causes prolonged/heavy cycles.

## 2017-01-01 NOTE — Addendum Note (Signed)
Addended by: Dalphine HandingGARDNER, Latressa Harries L on: 01/01/2017 11:33 AM   Modules accepted: Orders

## 2017-01-01 NOTE — Progress Notes (Signed)
36 yo G2P1102 here for Nexplanon insertion. Patient has been using Ortho Evra Patch for the past month and has been experiencing irregular bleeding. She admits to being forgetful and not changing it on time. Patient was made aware of irregular bleeding pattern that may happen with Nexplanon and was asked to keep it in place for at least a year. Patient with normal pap smear 03/2016  Patient given informed consent, signed copy in the chart, time out was performed. Pregnancy test was negative. Appropriate time out taken.  Patient's left arm was prepped and draped in the usual sterile fashion.. The ruler used to measure and mark insertion area.  Patient was prepped with alcohol swab and then injected with 3 cc of 1% lidocaine with epinephrine.  Patient was prepped with betadine, Nexplanon removed form packaging.  Device confirmed in needle, then inserted full length of needle and withdrawn per handbook instructions.  Patient insertion site covered with steri strips, band-aid and a pressure dressing.   Minimal blood loss.  Patient tolerated the procedure well.

## 2017-02-15 ENCOUNTER — Ambulatory Visit (INDEPENDENT_AMBULATORY_CARE_PROVIDER_SITE_OTHER): Payer: Medicaid Other | Admitting: Podiatry

## 2017-02-15 ENCOUNTER — Encounter: Payer: Self-pay | Admitting: Podiatry

## 2017-02-15 VITALS — BP 120/88 | HR 59 | Resp 18

## 2017-02-15 DIAGNOSIS — B351 Tinea unguium: Secondary | ICD-10-CM

## 2017-02-15 DIAGNOSIS — M79676 Pain in unspecified toe(s): Secondary | ICD-10-CM

## 2017-02-15 NOTE — Patient Instructions (Signed)

## 2017-02-15 NOTE — Progress Notes (Signed)
Subjective:    Patient ID: Katie SleightJennifer D Pope, female    DOB: Jun 14, 1980, 36 y.o.   MRN: [id number]  HPI  Katie DikeJennifer presents the office today for concerns of pain to her left big toenail. She states the nails become thick and discolored after she injured it about 8 months ago. She was started on Lamisil approximately 2 weeks ago and she has not noticed any significant improvement of the nail. Because of the pain to the toenails is requesting a left big toenail be removed. She states it hurts with pressure in shoes. She denies any redness or drainage or any swelling. She denies any side effects of Lamisil. She has no other concerns today.  Review of Systems  All other systems reviewed and are negative.  Past Medical History:  Diagnosis Date  . Asthma   . Bronchitis   . Scoliosis     Past Surgical History:  Procedure Laterality Date  . NO PAST SURGERIES       Current Outpatient Prescriptions:  .  ibuprofen (ADVIL,MOTRIN) 600 MG tablet, Take 1 tablet (600 mg total) by mouth every 6 (six) hours. (Patient not taking: Reported on 01/01/2017), Disp: 120 tablet, Rfl: 2 .  LATUDA 60 MG TABS, TAKE 1 TABLET BY MOUTH AT BEDTIME FOR MOOD, Disp: , Rfl: 1 .  norelgestromin-ethinyl estradiol (ORTHO EVRA) 150-35 MCG/24HR transdermal patch, Place 1 patch onto the skin once a week. (Patient not taking: Reported on 02/15/2017), Disp: 3 patch, Rfl: 12 .  pantoprazole (PROTONIX) 40 MG tablet, Take 1 tablet (40 mg total) by mouth daily. (Patient not taking: Reported on 01/01/2017), Disp: 30 tablet, Rfl: 1 .  Prenat-FeFmCb-DSS-FA-DHA w/o A (CITRANATAL HARMONY) 27-1-260 MG CAPS, Take 1 capsule by mouth daily before breakfast. (Patient not taking: Reported on 02/15/2017), Disp: 90 capsule, Rfl: 3 .  sertraline (ZOLOFT) 50 MG tablet, TAKE 1 TABLET BY MOUTH EVERY DAY, Disp: 30 tablet, Rfl: 2 .  terbinafine (LAMISIL) 250 MG tablet, Take 250 mg by mouth daily., Disp: , Rfl: 2  Allergies  Allergen Reactions  .  Other Rash    seafood  . Rocephin [Ceftriaxone Sodium In Dextrose] Rash    Social History   Social History  . Marital status: Single    Spouse name: N/A  . Number of children: N/A  . Years of education: N/A   Occupational History  . Not on file.   Social History Main Topics  . Smoking status: Former Smoker    Quit date: 12/23/2011  . Smokeless tobacco: Former NeurosurgeonUser  . Alcohol use No     Comment: occ  . Drug use: No  . Sexual activity: Yes    Birth control/ protection: Patch   Other Topics Concern  . Not on file   Social History Narrative  . No narrative on file   She states that she is currently not breast-feeding or pregnant.     Objective:   Physical Exam General: AAO x3, NAD  Dermatological: The left big toenail appears to be hypertrophic, dystrophic, discolored with yellow to brown discoloration. There is no swelling redness or drainage there is tenderness the entire toenail. The nail doesn't be regrowing and sideways as well. The nails loosely underlying nailbed distally and only adhered on the proximal aspect. The remaining toenails have some minimal yellow discoloration of there is no pain or significant incurvation or dystrophy. There is no open lesions or pre-ulcer lesions identified today.  Vascular: Dorsalis Pedis artery and Posterior Tibial artery pedal pulses are  2/4 bilateral with immedate capillary fill time. There is no pain with calf compression, swelling, warmth, erythema.   Neruologic: Grossly intact via light touch bilateral. Protective threshold with Semmes Wienstein monofilament intact to all pedal sites bilateral.  Musculoskeletal: No gross boney pedal deformities bilateral. No pain, crepitus, or limitation noted with foot and ankle range of motion bilateral. Muscular strength 5/5 in all groups tested bilateral.  Gait: Unassisted, Nonantalgic.      Assessment & Plan:  Onychomycosis, onychodystrophy hallux toenail with pain -Treatment options  discussed including all alternatives, risks, and complications -Etiology of symptoms were discussed -At this time, patient requested total nail removal without chemical matricectomy to the left hallux due to pain. Risks and complications were discussed with the patient for which they understand and  written consent was obtained. Under sterile conditions a total of 3 mL of a mixture of 2% lidocaine plain and 0.5% Marcaine plain was infiltrated in a hallux block fashion. Once anesthetized, the skin was prepped in sterile fashion. A tourniquet was then applied. Next the left hallux nail was excised making sure to remove the entire offending nail border. Once the nail was  Removed, the area was debrided and the underlying skin was intact. The area was irrigated and hemostasis was obtained.  A dry sterile dressing was applied. After application of the dressing the tourniquet was removed and there is found to be an immediate capillary refill time to the digit. The patient tolerated the procedure well any complications. Post procedure instructions were discussed the patient for which he verbally understood. Follow-up in one week for nail check or sooner if any problems are to arise. Discussed signs/symptoms of worsening infection and directed to call the office immediately should any occur or go directly to the emergency room. In the meantime, encouraged to call the office with any questions, concerns, changes symptoms. -Continue Lamisil.  Ovid Curd, DPM

## 2017-02-22 ENCOUNTER — Encounter: Payer: Self-pay | Admitting: Podiatry

## 2017-02-22 ENCOUNTER — Ambulatory Visit (INDEPENDENT_AMBULATORY_CARE_PROVIDER_SITE_OTHER): Payer: Medicaid Other | Admitting: Podiatry

## 2017-02-22 DIAGNOSIS — B351 Tinea unguium: Secondary | ICD-10-CM

## 2017-02-22 NOTE — Patient Instructions (Signed)

## 2017-02-22 NOTE — Progress Notes (Signed)
Subjective: Katie Pope is a 36 y.o.  female returns to office today for follow up evaluation after having left Hallux total nail avulsion performed. Patient has been soaking using epsom salt and applying topical antibiotic covered with bandaid daily. Denies any drainage, pus, redness. She did have a mild increase in pain on Tuesday but she thinks she may have hit it but the pain has improved and currently denies any. Patient denies fevers, chills, nausea, vomiting. Denies any calf pain, chest pain, SOB.   Objective:  Vitals: Reviewed  General: Well developed, nourished, in no acute distress, alert and oriented x3   Dermatology: Skin is warm, dry and supple bilateral. Led hallux nail bed appears to be clean, dry, with mild granular tissue and surrounding scab. There is no surrounding erythema, edema, drainage/purulence. The remaining nails appear unremarkable at this time. There are no other lesions or other signs of infection present.  Neurovascular status: Intact. No lower extremity swelling; No pain with calf compression bilateral.  Musculoskeletal: No tenderness to palpation of the left hallux nailbed. Muscular strength within normal limits bilateral.   Assesement and Plan: S/p partial nail avulsion, doing well.   -Continue soaking in epsom salts twice a day followed by antibiotic ointment and a band-aid. Can leave uncovered at night. Continue this until completely healed.  -If the area has not healed in 2 weeks, call the office for follow-up appointment, or sooner if any problems arise.  -Monitor for any signs/symptoms of infection. Call the office immediately if any occur or go directly to the emergency room. Call with any questions/concerns.  Ovid CurdMatthew Wagoner, DPM

## 2017-07-11 ENCOUNTER — Encounter: Payer: Self-pay | Admitting: *Deleted

## 2018-05-30 IMAGING — US US MFM OB FOLLOW-UP
1 series · 14 of 28 positions shown · non-contrast
Comparison: none

[Series 1: us mfm ob follow-up · 14 of 35 slices shown]
[im 2/35]
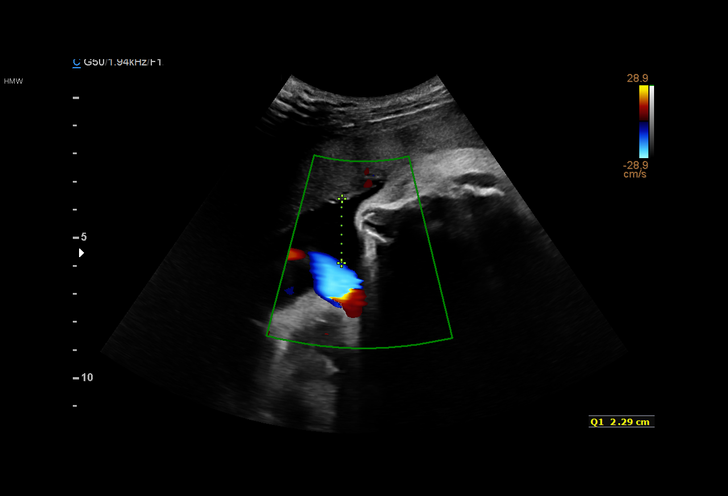
[im 4/35]
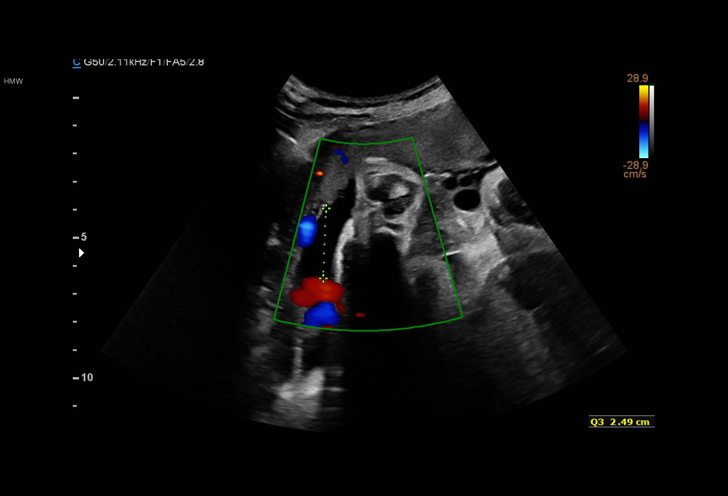
[im 7/35]
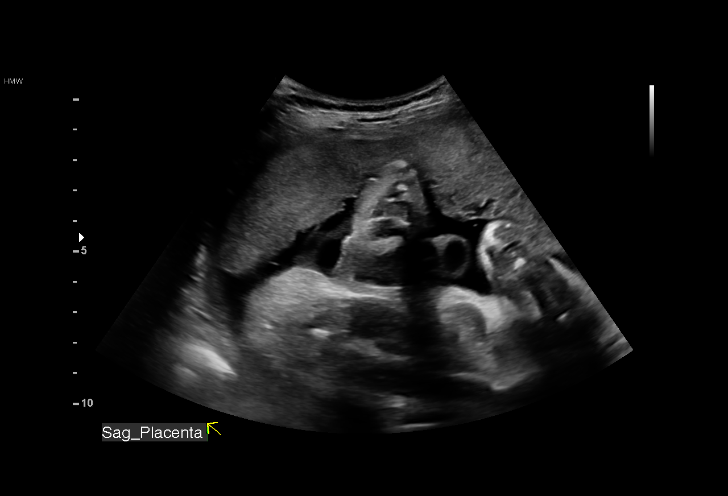
[im 9/35]
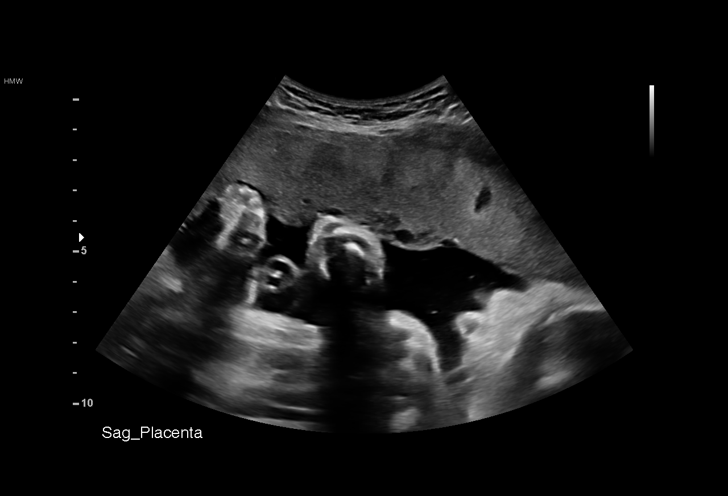
[im 12/35]
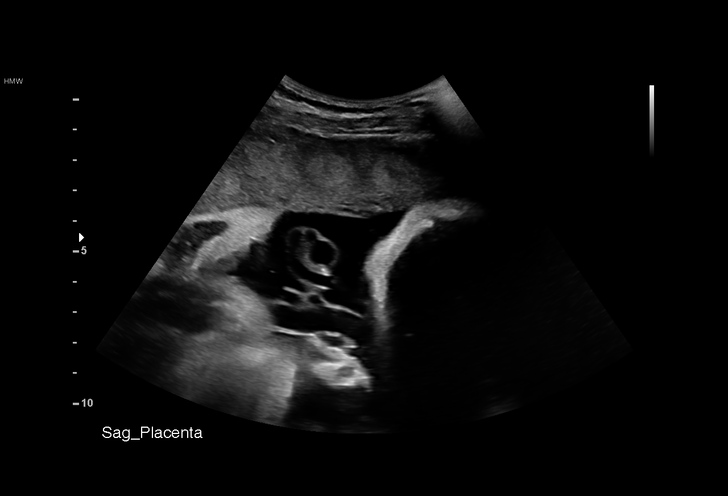
[im 14/35]
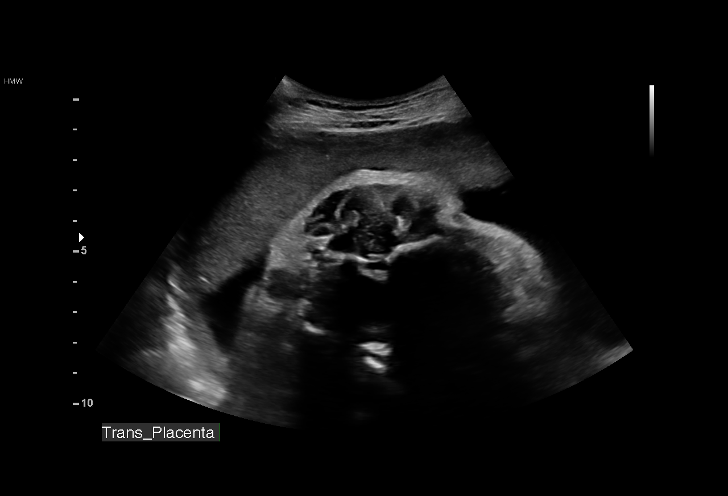
[im 17/35]
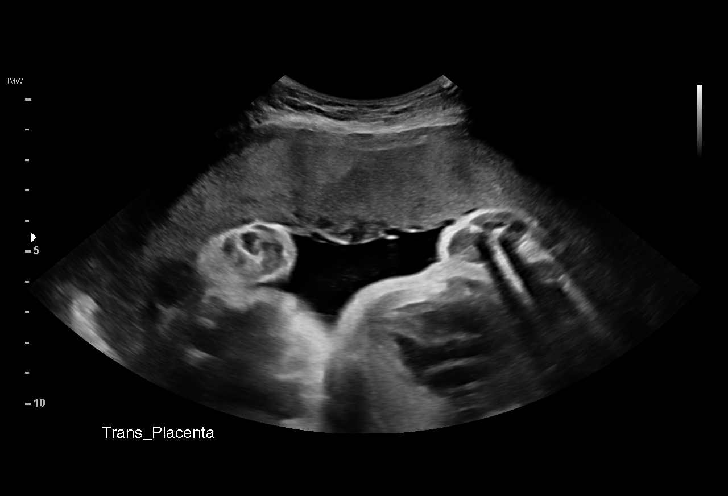
[im 19/35]
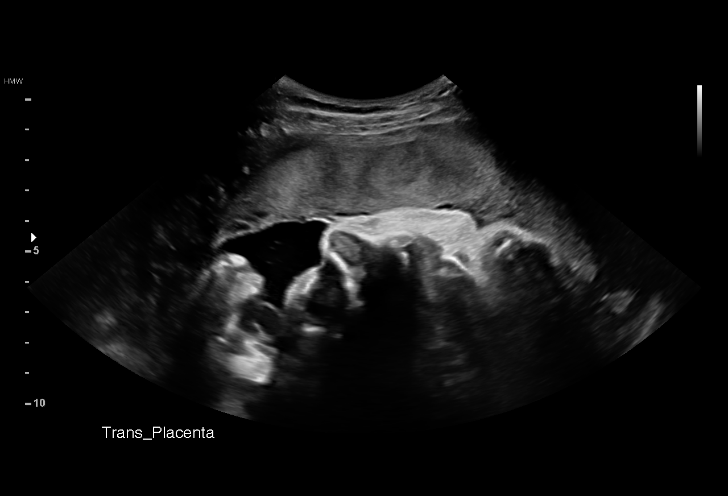
[im 22/35]
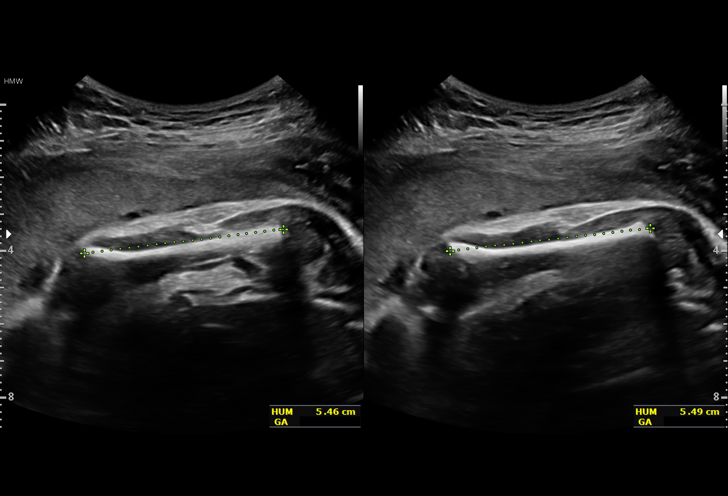
[im 24/35]
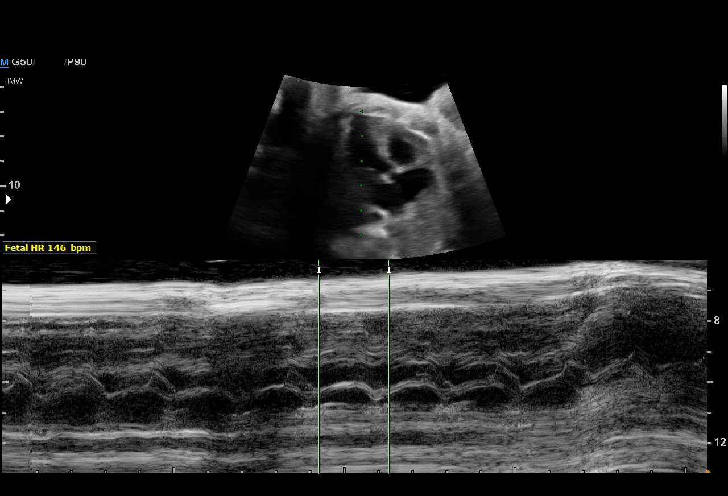
[im 27/35]
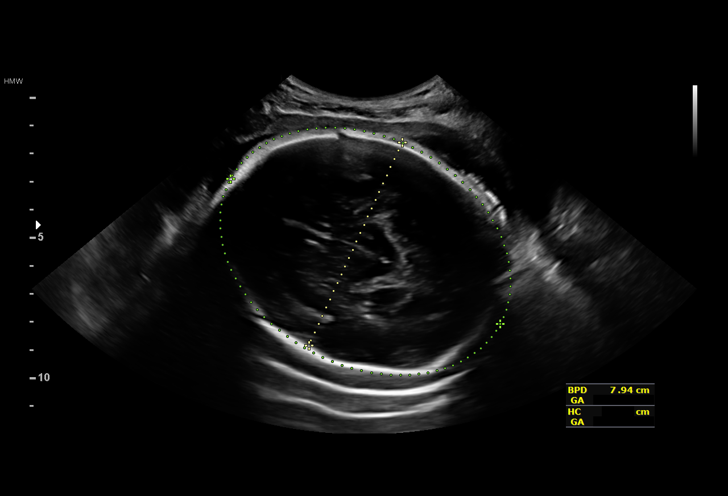
[im 29/35]
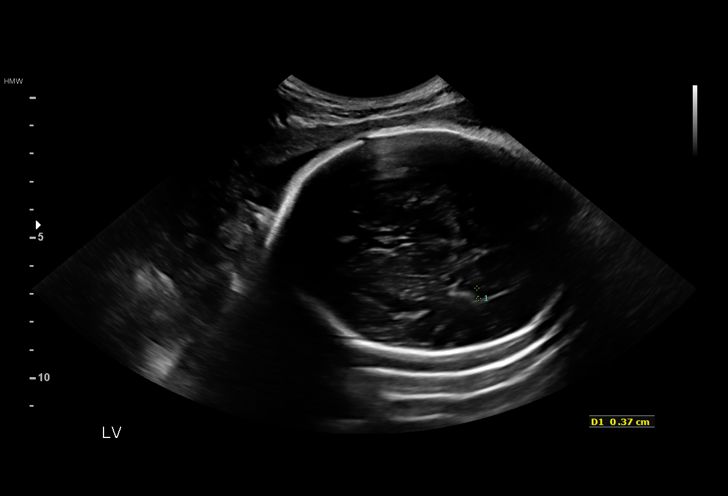
[im 32/35]
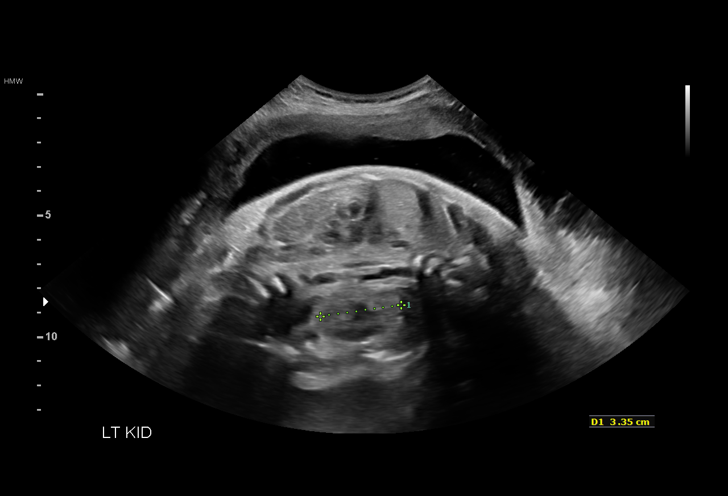
[im 35/35]
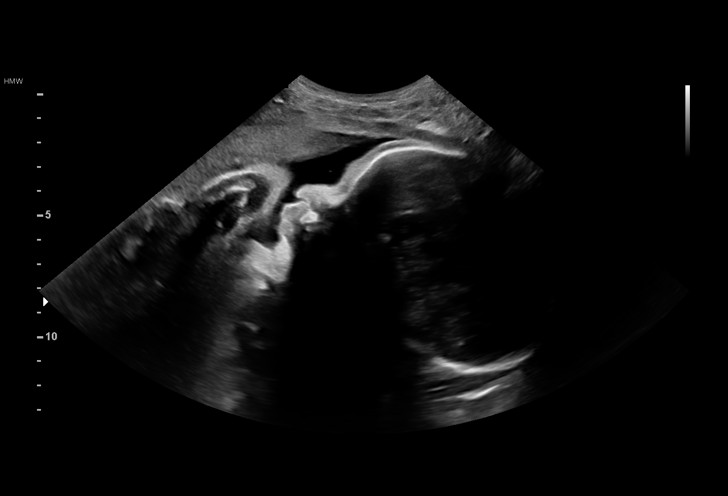

[14 of 28 positions shown; findings below may reference images not displayed]

Attending:        Yairo Eca        Secondary Phy.:   3rd Nursing- 3rd
floor [phone]
for [REDACTED]care ([HOSPITAL])

1  MAYLET YADIRA          [id number]      [phone]     [id number]
Indications

33 weeks gestation of pregnancy
Advanced maternal age multigravida 35+,
second trimester (low risk NIPS)
Vaginal bleeding in pregnancy, third trimester
OB History

Gravidity:    2         Term:   1        Prem:   0        SAB:   0
TOP:          0       Ectopic:  0        Living: 1
Fetal Evaluation

Num Of Fetuses:     1
Fetal Heart         146
Rate(bpm):
Cardiac Activity:   Observed
Presentation:       Cephalic
Placenta:           Anterior, above cervical os
P. Cord Insertion:  Visualized, central

Amniotic Fluid
AFI FV:      Subjectively within normal limits

AFI Sum(cm)     %Tile       Largest Pocket(cm)
14.5            51

RUQ(cm)       RLQ(cm)       LUQ(cm)        LLQ(cm)
2.29
Biometry

BPD:      79.4  mm     G. Age:  31w 6d         14  %    CI:        72.08   %   70 - 86
FL/HC:      20.4   %   19.9 -
HC:      297.6  mm     G. Age:  33w 0d         15  %    HC/AC:      1.07       0.96 -
AC:      278.9  mm     G. Age:  32w 0d         22  %    FL/BPD:     76.3   %   71 - 87
FL:       60.6  mm     G. Age:  31w 4d          9  %    FL/AC:      21.7   %   20 - 24
HUM:      54.8  mm     G. Age:  31w 6d         37  %

Est. FW:    5815  gm      4 lb 2 oz     37  %
Gestational Age

U/S Today:     32w 1d                                        EDD:   11/13/16
Best:          33w 0d    Det. By:   Early Ultrasound         EDD:   11/07/16
(03/14/16)
Anatomy

Cranium:               Appears normal         Aortic Arch:            Previously seen
Cavum:                 Previously seen        Ductal Arch:            Previously seen
Ventricles:            Appears normal         Diaphragm:              Previously seen
Choroid Plexus:        Previously seen        Stomach:                Appears normal, left
sided
Cerebellum:            Previously seen        Abdomen:                Previously seen
Posterior Fossa:       Previously seen        Abdominal Wall:         Previously seen
Nuchal Fold:           Previously seen        Cord Vessels:           Previously seen
Face:                  Orbits and profile     Kidneys:                Appear normal
previously seen
Lips:                  Previously seen        Bladder:                Appears normal
Thoracic:              Appears normal         Spine:                  Previously seen
Heart:                 Appears normal         Upper Extremities:      Previously seen
(4CH, axis, and
situs)
RVOT:                  Previously seen        Lower Extremities:      Previously seen
LVOT:                  Appears normal

Other:  Fetus appears to be a male. Heels and 5th digit previously visualized.
Open hands previously visualized. Nasal bone previously visualized.
Cervix Uterus Adnexa

Cervix
Not visualized (advanced GA >72wks)

Uterus
No abnormality visualized.

Left Ovary
No adnexal mass visualized.

Right Ovary
No adnexal mass visualized.

Cul De Sac:   No free fluid seen.

Adnexa:       No abnormality visualized.
Impression

Single IUP at 33w 0d
AMA, vaginal bleeding
Normal interval anatomy
Fetal growth is appropriate (37th %tile)
Anterior placenta without previa. No sonographic findings
suggestive of abruption noted.
Normal amniotic fluid volume
Recommendations

Follow up ultrasounds as clinically indicated

## 2018-10-31 IMAGING — US US OB TRANSVAGINAL
1 series · 13 of 28 positions shown · non-contrast
Comparison: Pelvic ultrasound performed 08/24/2008

CLINICAL DATA: Acute onset of abdominal cramping and vomiting.
Initial encounter.

EXAM:
OBSTETRIC <14 WK US AND TRANSVAGINAL OB US
TECHNIQUE: Both transabdominal and transvaginal ultrasound examinations were
performed for complete evaluation of the gestation as well as the
maternal uterus, adnexal regions, and pelvic cul-de-sac.
Transvaginal technique was performed to assess early pregnancy.

[Series 1: us ob transvaginal · 0.22mm/px · 74 acquisitions, 13 frames shown]
[im 3/74]
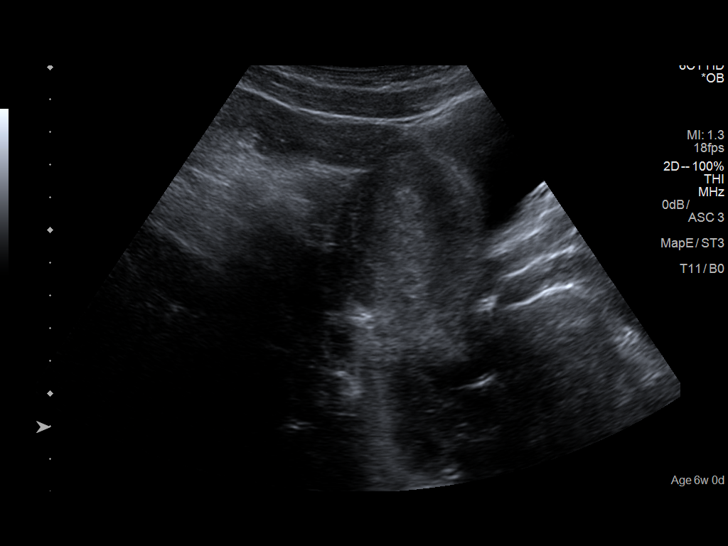
[im 9/74]
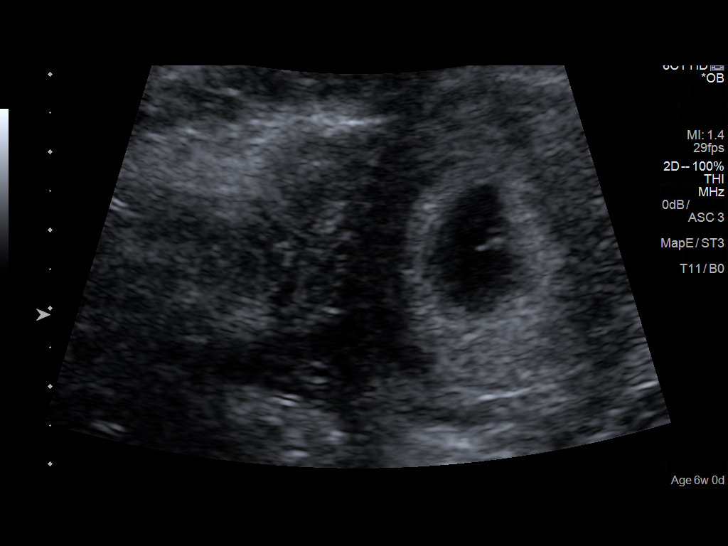
[im 14/74]
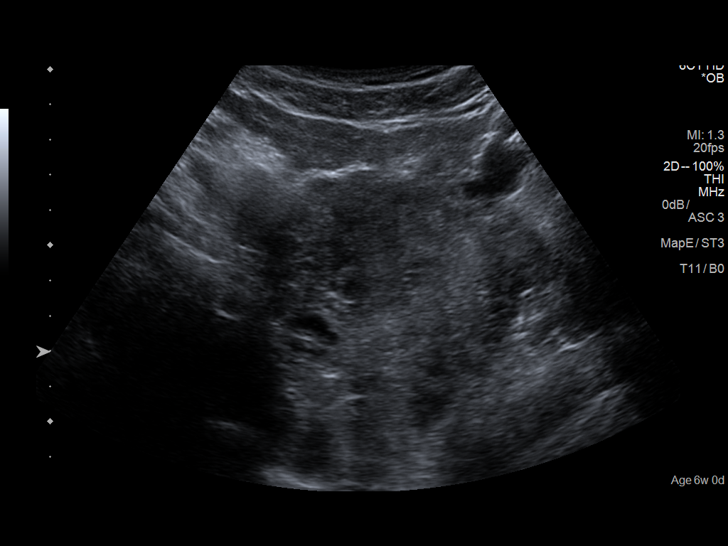
[im 19/74]
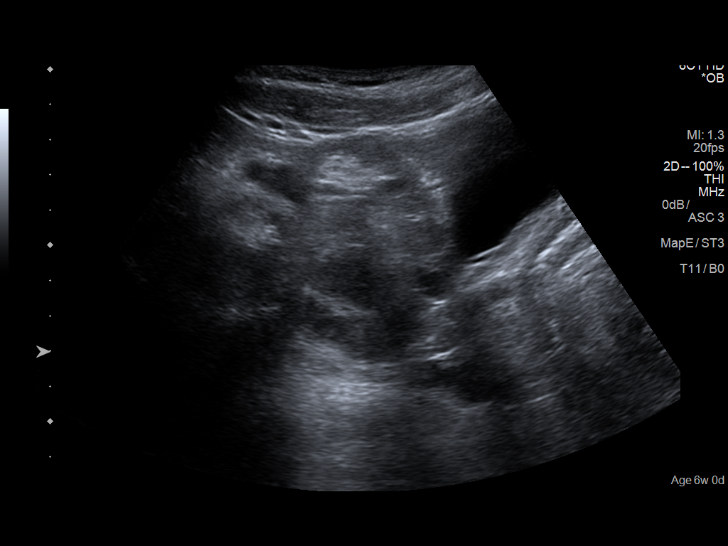
[im 25/74]
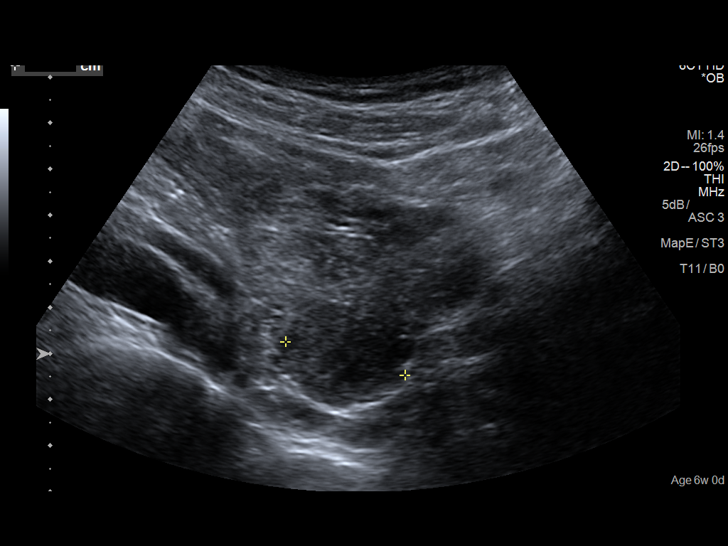
[im 30/74]
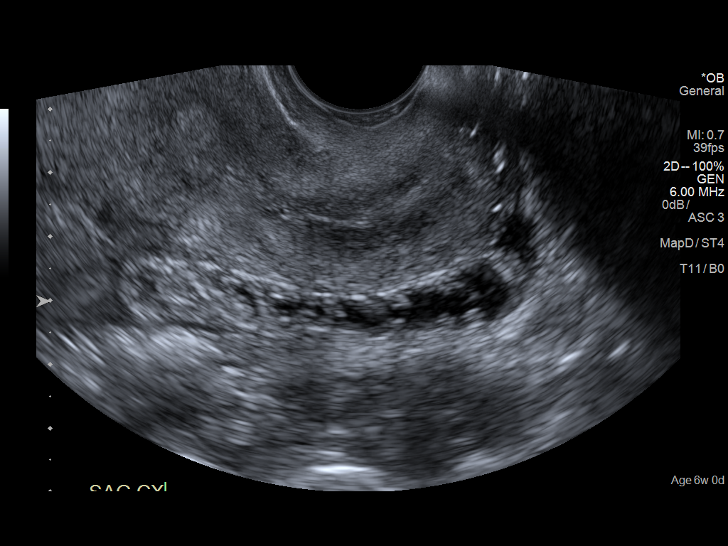
[im 38/74]
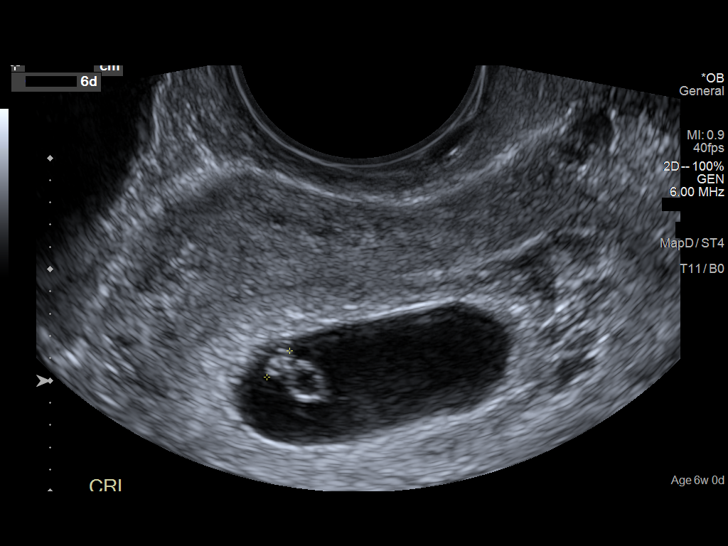
[im 44/74]
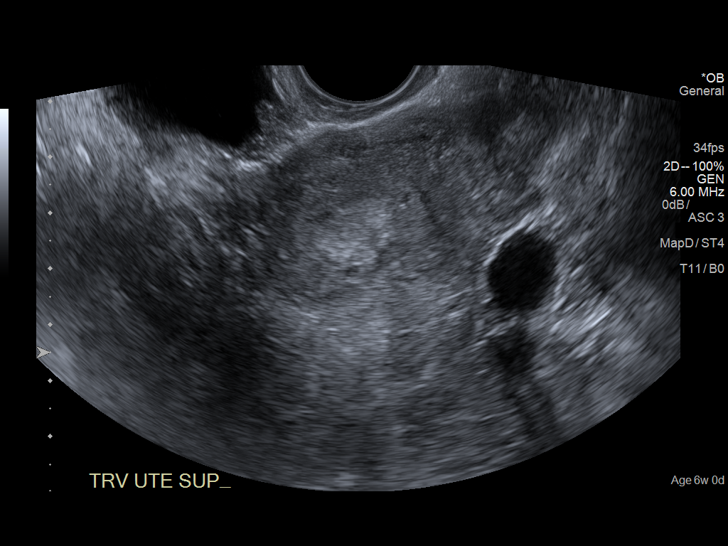
[im 49/74]
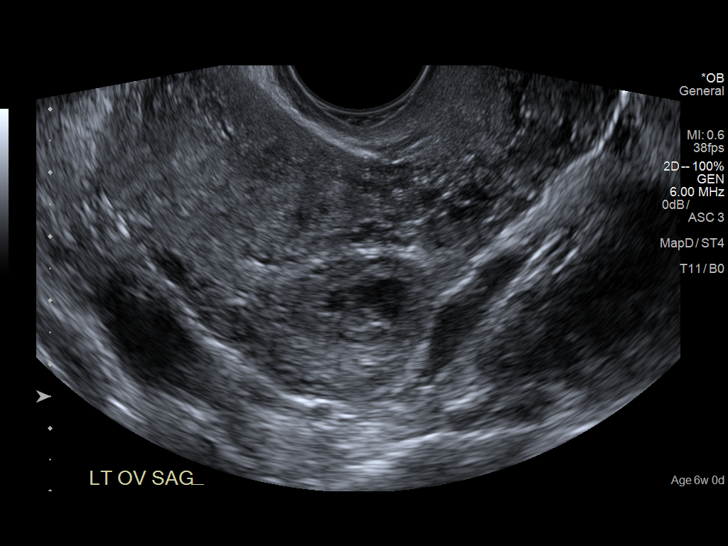
[im 55/74]
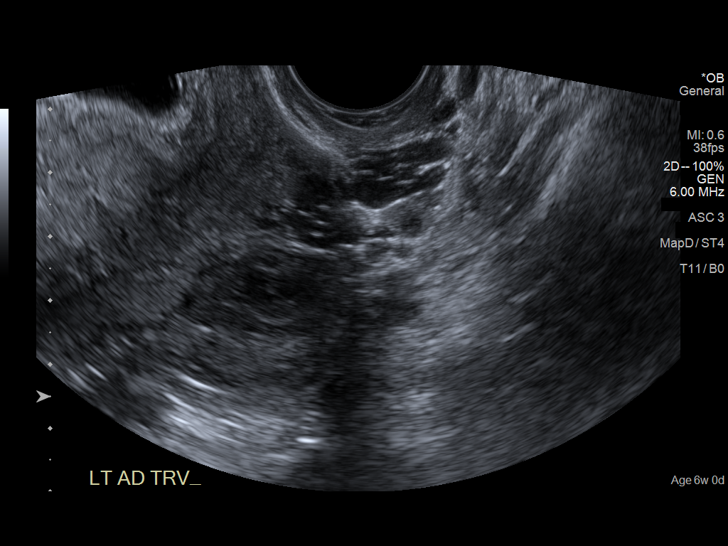
[im 60/74]
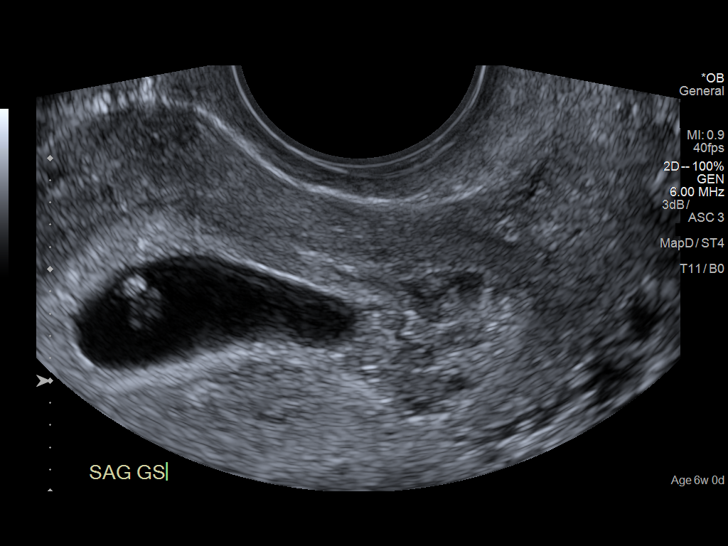
[im 65/74]
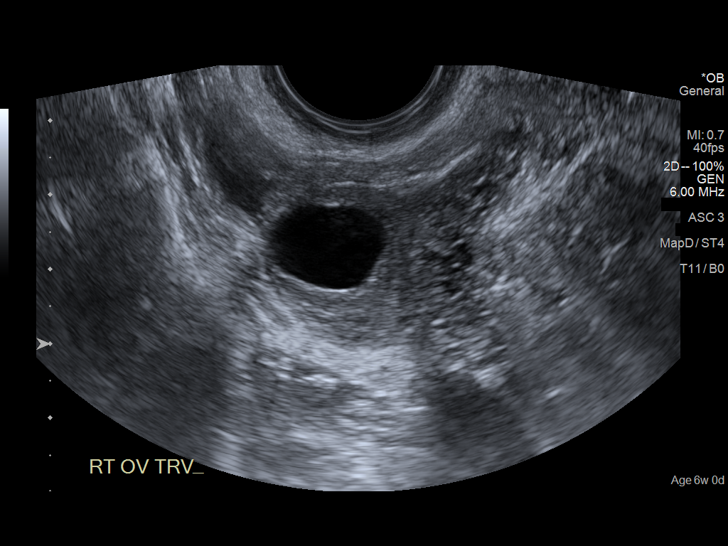
[im 71/74]
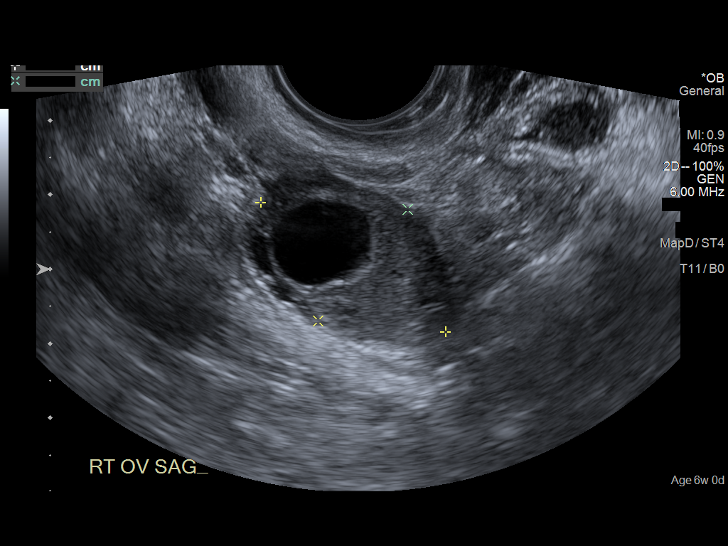

[13 of 28 positions shown; findings below may reference images not displayed]

FINDINGS: Intrauterine gestational sac: Single; visualized and normal in
shape.

Yolk sac:  Yes

Embryo:  Yes

Cardiac Activity: Yes

Heart Rate: 117  bpm

CRL:  3.2  mm   6 w   0 d                  US EDC: 11/07/2016

Subchorionic hemorrhage: A small amount of subchorionic hemorrhage
is noted.

Maternal uterus/adnexae: The uterus is otherwise unremarkable.

The ovaries are unremarkable in appearance. The right ovary measures
3.0 x 1.9 x 2.6 cm, while the left ovary measures 2.8 x 2.2 x
cm. No suspicious adnexal masses are seen; there is no evidence for
ovarian torsion.

Trace free fluid is seen within the pelvic cul-de-sac.
IMPRESSION: 1. Single live intrauterine pregnancy noted, with a crown-rump
length of 3 mm, corresponding to a gestational age of 6 weeks 0
days. This matches the gestational age by LMP, reflecting an
estimated date of delivery November 07, 2016.
2. Small amount of subchorionic hemorrhage noted.

## 2021-08-23 ENCOUNTER — Ambulatory Visit (INDEPENDENT_AMBULATORY_CARE_PROVIDER_SITE_OTHER): Payer: Medicaid Other | Admitting: Obstetrics

## 2021-08-23 ENCOUNTER — Other Ambulatory Visit (HOSPITAL_COMMUNITY)
Admission: RE | Admit: 2021-08-23 | Discharge: 2021-08-23 | Disposition: A | Discharge status: Home / Self Care | Payer: Medicaid Other | Source: Ambulatory Visit | Attending: Obstetrics | Admitting: Obstetrics

## 2021-08-23 ENCOUNTER — Encounter: Payer: Self-pay | Admitting: Obstetrics

## 2021-08-23 VITALS — BP 122/87 | HR 87 | Ht 65.0 in | Wt 183.4 lb

## 2021-08-23 DIAGNOSIS — Z1239 Encounter for other screening for malignant neoplasm of breast: Secondary | ICD-10-CM

## 2021-08-23 DIAGNOSIS — N898 Other specified noninflammatory disorders of vagina: Secondary | ICD-10-CM | POA: Insufficient documentation

## 2021-08-23 DIAGNOSIS — Z01419 Encounter for gynecological examination (general) (routine) without abnormal findings: Secondary | ICD-10-CM | POA: Diagnosis present

## 2021-08-23 DIAGNOSIS — Z3046 Encounter for surveillance of implantable subdermal contraceptive: Secondary | ICD-10-CM

## 2021-08-23 DIAGNOSIS — Z113 Encounter for screening for infections with a predominantly sexual mode of transmission: Secondary | ICD-10-CM | POA: Diagnosis not present

## 2021-08-23 NOTE — Progress Notes (Signed)
GYN/ Annual ?Irregular cycles ?Nexplanon X 3 years ? ?Anxiety and depression screen positive/ has provider managing mental health and meds ?

## 2021-08-23 NOTE — Progress Notes (Signed)
? ?Subjective: ? ? ?  ?  ? Katie Pope is a 41 y.o. female here for a routine exam.  Current complaints: Vaginal discharge.   ? ?Personal health questionnaire:  ?Is patient Ashkenazi Jewish, have a family history of breast and/or ovarian cancer: no ?Is there a family history of uterine cancer diagnosed at age < 92, gastrointestinal cancer, urinary tract cancer, family member who is a Personnel officer syndrome-associated carrier: no ?Is the patient overweight and hypertensive, family history of diabetes, personal history of gestational diabetes, preeclampsia or PCOS: no ?Is patient over 68, have PCOS,  family history of premature CHD under age 9, diabetes, smoke, have hypertension or peripheral artery disease:  no ?At any time, has a partner hit, kicked or otherwise hurt or frightened you?: no ?Over the past 2 weeks, have you felt down, depressed or hopeless?: no ?Over the past 2 weeks, have you felt little interest or pleasure in doing things?:no ? ? ?Gynecologic History ?No LMP recorded (lmp unknown). ?Contraception: Nexplanon ?Last Pap: 2017. Results were: normal ?Last mammogram: none. Results were: none ? ?Obstetric History ?OB History  ?Gravida Para Term Preterm AB Living  ?2 2 1 1   2   ?SAB IAB Ectopic Multiple Live Births  ?        2  ?  ?# Outcome Date GA Lbr Len/2nd Weight Sex Delivery Anes PTL Lv  ?2 Preterm 09/26/16 [redacted]w[redacted]d  4 lb 9 oz (2.07 kg)  Vag-Spont   LIV  ?1 Term 08/24/08 [redacted]w[redacted]d  5 lb 6 oz (2.438 kg) F Vag-Spont     ? ? ?Past Medical History:  ?Diagnosis Date  ? Anxiety   ? Asthma   ? Bipolar 1 disorder (HCC)   ? Bronchitis   ? Depression   ? Mental disorder   ? Scoliosis   ?  ?Past Surgical History:  ?Procedure Laterality Date  ? FACIAL FRACTURE SURGERY Left   ?  ? ?Current Outpatient Medications:  ?  cyproheptadine (PERIACTIN) 4 MG tablet, Take 4 mg by mouth 2 (two) times daily., Disp: , Rfl:  ?  escitalopram (LEXAPRO) 5 MG tablet, Take 5 mg by mouth daily., Disp: , Rfl:  ?  EYSUVIS 0.25 % SUSP, Apply  1 drop to eye 2 (two) times daily., Disp: , Rfl:  ?  gabapentin (NEURONTIN) 100 MG capsule, Take 100 mg by mouth 2 (two) times daily as needed., Disp: , Rfl:  ?  ibuprofen (ADVIL,MOTRIN) 600 MG tablet, Take 1 tablet (600 mg total) by mouth every 6 (six) hours., Disp: 120 tablet, Rfl: 2 ?  lamoTRIgine (LAMICTAL) 25 MG tablet, Take 50 mg by mouth at bedtime., Disp: , Rfl:  ?  LATUDA 60 MG TABS, TAKE 1 TABLET BY MOUTH AT BEDTIME FOR MOOD, Disp: , Rfl: 1 ?  norelgestromin-ethinyl estradiol (ORTHO EVRA) 150-35 MCG/24HR transdermal patch, Place 1 patch onto the skin once a week., Disp: 3 patch, Rfl: 12 ?  OLANZapine (ZYPREXA) 10 MG tablet, Take 10 mg by mouth at bedtime., Disp: , Rfl:  ?  pantoprazole (PROTONIX) 40 MG tablet, Take 1 tablet (40 mg total) by mouth daily., Disp: 30 tablet, Rfl: 1 ?  Prenat-FeFmCb-DSS-FA-DHA w/o A (CITRANATAL HARMONY) 27-1-260 MG CAPS, Take 1 capsule by mouth daily before breakfast., Disp: 90 capsule, Rfl: 3 ?  propranolol (INDERAL) 40 MG tablet, Take 40 mg by mouth daily as needed., Disp: , Rfl:  ?  RESTASIS 0.05 % ophthalmic emulsion, Place 1 drop into both eyes 2 (two) times daily., Disp: ,  Rfl:  ?  sertraline (ZOLOFT) 50 MG tablet, TAKE 1 TABLET BY MOUTH EVERY DAY, Disp: 30 tablet, Rfl: 2 ?  terbinafine (LAMISIL) 250 MG tablet, Take 250 mg by mouth daily., Disp: , Rfl: 2 ?Allergies  ?Allergen Reactions  ? Other Rash  ?  seafood  ? Rocephin [Ceftriaxone Sodium In Dextrose] Rash  ? Shellfish-Derived Products Rash  ?  ?Social History  ? ?Tobacco Use  ? Smoking status: Former  ?  Types: Cigarettes  ?  Quit date: 12/23/2011  ?  Years since quitting: 9.6  ? Smokeless tobacco: Former  ?Substance Use Topics  ? Alcohol use: Not Currently  ?  Comment: occ  ?  ?Family History  ?Problem Relation Age of Onset  ? Cancer Paternal Grandfather   ? Dementia Paternal Grandmother   ? Diabetes Mother   ?  ? ? ?Review of Systems ? ?Constitutional: negative for fatigue and weight loss ?Respiratory: negative for  cough and wheezing ?Cardiovascular: negative for chest pain, fatigue and palpitations ?Gastrointestinal: negative for abdominal pain and change in bowel habits ?Musculoskeletal:negative for myalgias ?Neurological: negative for gait problems and tremors ?Behavioral/Psych: negative for abusive relationship, depression ?Endocrine: negative for temperature intolerance    ?Genitourinary: positive for vaginal discharge.  negative for abnormal menstrual periods, genital lesions, hot flashes, sexual problems  ?Integument/breast: negative for breast lump, breast tenderness, nipple discharge and skin lesion(s) ? ?  ?Objective:  ? ?    ?BP 122/87   Pulse 87   Ht 5\' 5"  (1.651 m)   Wt 183 lb 6.4 oz (83.2 kg)   LMP  (LMP Unknown) Comment: Nexplanon  BMI 30.52 kg/m?  ?General:   Alert and no distress  ?Skin:   no rash or abnormalities  ?Lungs:   clear to auscultation bilaterally  ?Heart:   regular rate and rhythm, S1, S2 normal, no murmur, click, rub or gallop  ?Breasts:   normal without suspicious masses, skin or nipple changes or axillary nodes  ?Abdomen:  normal findings: no organomegaly, soft, non-tender and no hernia  ?Pelvis:  External genitalia: normal general appearance ?Urinary system: urethral meatus normal and bladder without fullness, nontender ?Vaginal: normal without tenderness, induration or masses ?Cervix: normal appearance ?Adnexa: normal bimanual exam ?Uterus: anteverted and non-tender, normal size  ? ?Lab Review ?Urine pregnancy test ?Labs reviewed yes ?Radiologic studies reviewed yes ? ?I have spent a total of 20 minutes of face-to-face time, excluding clinical staff time, reviewing notes and preparing to see patient, ordering tests and/or medications, and counseling the patient.  ? ?Assessment:  ? ?1. Encounter for gynecological examination with Papanicolaou smear of cervix ?Rx: ?- Cytology - PAP( [place]) ? ?2. Vaginal discharge ?Rx: ?- Cervicovaginal ancillary only( [place]) ? ?3. Screening for  STD (sexually transmitted disease) ?Rx: ?- Hepatitis C Antibody ?- HIV antibody (with reflex) ?- RPR ?- Hepatitis B surface antigen ? ?4. Screening breast examination ?Rx: ?- MM 3D SCREEN BREAST BILATERAL; Future ? ?5. Encounter for surveillance of implantable subdermal contraceptive ?- has Nexplanon that is due for removal  ?- will schedule appointment for Nexplanon Removal and probable IUD Insertion ? ? ?Plan:  ? ? Education reviewed: calcium supplements, depression evaluation, low fat, low cholesterol diet, safe sex/STD prevention, self breast exams, and weight bearing exercise. ?Mammogram ordered. ?Follow up in: 4 weeks.  Nexplanon Removal and probable IUD Insertion ? ? ?Orders Placed This Encounter  ?Procedures  ? MM 3D SCREEN BREAST BILATERAL  ?  Standing Status:   Future  ?  Standing Expiration Date:   08/23/2022  ?  Order Specific Question:   Reason for Exam (SYMPTOM  OR DIAGNOSIS REQUIRED)  ?  Answer:   Screening  ?  Order Specific Question:   Is the patient pregnant?  ?  Answer:   No  ?  Order Specific Question:   Preferred imaging location?  ?  Answer:   [facility]  ? Hepatitis C Antibody  ? HIV antibody (with reflex)  ? RPR  ? Hepatitis B surface antigen  ? ? ? Brock BadHarper, Cully Luckow A, MD ?08/23/2021 9:57 AM  ?

## 2021-08-24 LAB — CERVICOVAGINAL ANCILLARY ONLY
Bacterial Vaginitis (gardnerella): POSITIVE — AB
Candida Glabrata: NEGATIVE
Candida Vaginitis: NEGATIVE
Chlamydia: NEGATIVE
Comment: NEGATIVE
Comment: NEGATIVE
Comment: NEGATIVE
Comment: NEGATIVE
Comment: NEGATIVE
Comment: NORMAL
Neisseria Gonorrhea: NEGATIVE
Trichomonas: NEGATIVE

## 2021-08-24 LAB — HIV ANTIBODY (ROUTINE TESTING W REFLEX): HIV Screen 4th Generation wRfx: NONREACTIVE

## 2021-08-24 LAB — HEPATITIS B SURFACE ANTIGEN: Hepatitis B Surface Ag: NEGATIVE

## 2021-08-24 LAB — HEPATITIS C ANTIBODY: Hep C Virus Ab: NONREACTIVE

## 2021-08-24 LAB — RPR: RPR Ser Ql: NONREACTIVE

## 2021-08-25 LAB — CYTOLOGY - PAP
Comment: NEGATIVE
Comment: NEGATIVE
Comment: NEGATIVE
Diagnosis: NEGATIVE
HPV 16: NEGATIVE
HPV 18 / 45: NEGATIVE
High risk HPV: POSITIVE — AB

## 2021-08-27 ENCOUNTER — Other Ambulatory Visit: Payer: Self-pay | Admitting: Obstetrics

## 2021-08-27 DIAGNOSIS — N760 Acute vaginitis: Secondary | ICD-10-CM

## 2021-08-27 MED ORDER — METRONIDAZOLE 500 MG PO TABS: 500.0000 mg | ORAL_TABLET | Freq: Two times a day (BID) | ORAL | 2 refills | Status: DC

## 2021-08-29 ENCOUNTER — Telehealth: Payer: Self-pay

## 2021-08-29 NOTE — Telephone Encounter (Signed)
S/w pt and advised of pap results and need for colpo. Advised scheduler will call for appt. ?

## 2021-09-01 ENCOUNTER — Ambulatory Visit (INDEPENDENT_AMBULATORY_CARE_PROVIDER_SITE_OTHER): Payer: Medicaid Other | Admitting: Student

## 2021-09-01 ENCOUNTER — Encounter: Payer: Self-pay | Admitting: Student

## 2021-09-01 VITALS — BP 134/90 | HR 102 | Ht 65.0 in | Wt 181.8 lb

## 2021-09-01 DIAGNOSIS — Z3046 Encounter for surveillance of implantable subdermal contraceptive: Secondary | ICD-10-CM

## 2021-09-01 DIAGNOSIS — B977 Papillomavirus as the cause of diseases classified elsewhere: Secondary | ICD-10-CM | POA: Insufficient documentation

## 2021-09-01 NOTE — Progress Notes (Signed)
? ? ? ?  GYNECOLOGY OFFICE PROCEDURE NOTE ? ?Katie Pope is a 41 y.o. [id number] here for Nexplanon removal.  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 She is to be scheduled for a colposcopy.  No other gynecologic concerns. ? ? ? ?Nexplanon Removal ?Patient identified, informed consent performed, consent signed.   Appropriate time out taken. Nexplanon site identified.  Area prepped in usual sterile fashon. One ml of 1% lidocaine was used to anesthetize the area at the distal end of the implant. A small stab incision was made right beside the implant on the distal portion.  The Nexplanon rod was grasped using hemostats and removed without difficulty.  There was minimal blood loss. There were no complications.  3 ml of 1% lidocaine was injected around the incision for post-procedure analgesia.  Steri-strips were applied over the small incision.  A pressure bandage was applied to reduce any bruising.  The patient tolerated the procedure well and was given post procedure instructions.  Patient is planning to use nothing  for contraception.  ? ? ? ?[name]  ?[facility] ? ? ? ? ?

## 2021-09-01 NOTE — Progress Notes (Signed)
Pt in office for Nexplanon removal. Pt does not want any birth control today.  ? ?

## 2021-09-09 ENCOUNTER — Ambulatory Visit (HOSPITAL_BASED_OUTPATIENT_CLINIC_OR_DEPARTMENT_OTHER)
Admission: RE | Admit: 2021-09-09 | Discharge: 2021-09-09 | Disposition: A | Discharge status: Home / Self Care | Payer: Medicaid Other | Source: Ambulatory Visit | Attending: Obstetrics | Admitting: Obstetrics

## 2021-09-09 DIAGNOSIS — Z1239 Encounter for other screening for malignant neoplasm of breast: Secondary | ICD-10-CM

## 2021-09-09 DIAGNOSIS — Z1231 Encounter for screening mammogram for malignant neoplasm of breast: Secondary | ICD-10-CM | POA: Insufficient documentation

## 2021-10-11 ENCOUNTER — Other Ambulatory Visit: Payer: Medicaid Other | Admitting: Obstetrics

## 2021-11-15 ENCOUNTER — Ambulatory Visit (INDEPENDENT_AMBULATORY_CARE_PROVIDER_SITE_OTHER): Payer: Medicaid Other | Admitting: Obstetrics and Gynecology

## 2021-11-15 ENCOUNTER — Other Ambulatory Visit (HOSPITAL_COMMUNITY)
Admission: RE | Admit: 2021-11-15 | Discharge: 2021-11-15 | Disposition: A | Discharge status: Home / Self Care | Payer: Medicaid Other | Source: Ambulatory Visit | Attending: Obstetrics | Admitting: Obstetrics

## 2021-11-15 DIAGNOSIS — R87810 Cervical high risk human papillomavirus (HPV) DNA test positive: Secondary | ICD-10-CM | POA: Insufficient documentation

## 2021-11-15 DIAGNOSIS — B977 Papillomavirus as the cause of diseases classified elsewhere: Secondary | ICD-10-CM

## 2021-11-15 NOTE — Progress Notes (Signed)
Patient given informed consent, signed copy in the chart, time out was performed.UPT negative.  Pap reviewed and normal pap noted with positive high risk HPV.   Placed in lithotomy position. Cervix viewed with speculum and colposcope after application of acetic acid. Monsel's solution placed for hemostasis.  Colposcopy adequate?  yes Acetowhite lesions? None Lugol's: patch of nonstaining tissue at 10 o'clock Punctation?no  Mosaicism?   none Abnormal vasculature?   none Biopsies? 1 at 10 o'clock ECC? yes  COMMENTS: Patient was given post procedure instructions.  She will return in 5 weeks for IUD placement if biopsy is normal,  Warden Fillers, MD

## 2021-11-15 NOTE — Progress Notes (Signed)
41 y.o GYN presents for COLPO for +High risk HPV on PAP.  UPT today is Negative

## 2021-11-17 LAB — SURGICAL PATHOLOGY

## 2022-01-04 ENCOUNTER — Ambulatory Visit: Payer: Medicaid Other | Admitting: Obstetrics and Gynecology

## 2022-01-04 ENCOUNTER — Encounter: Payer: Self-pay | Admitting: Obstetrics and Gynecology

## 2022-01-04 VITALS — BP 115/81 | HR 91 | Wt 183.0 lb

## 2022-01-04 DIAGNOSIS — Z30430 Encounter for insertion of intrauterine contraceptive device: Secondary | ICD-10-CM | POA: Diagnosis not present

## 2022-01-04 LAB — POCT URINE PREGNANCY: Preg Test, Ur: NEGATIVE

## 2022-01-04 MED ORDER — LEVONORGESTREL 20 MCG/DAY IU IUD
1.0000 | INTRAUTERINE_SYSTEM | Freq: Once | INTRAUTERINE | Status: AC
  Administered 2022-01-04: 1 via INTRAUTERINE

## 2022-01-04 NOTE — Progress Notes (Signed)
    GYNECOLOGY OFFICE PROCEDURE NOTE  Katie Pope is a 41 y.o. [phone] here for Mirena IUD insertion. No GYN concerns.   IUD Insertion Procedure Note Patient identified, informed consent performed, consent signed.   Discussed risks of irregular bleeding, cramping, infection, malpositioning or misplacement of the IUD outside the uterus which may require further procedure such as laparoscopy. Also discussed >99% contraception efficacy, increased risk of ectopic pregnancy with failure of method.  Time out was performed.  Urine pregnancy test negative.  Speculum placed in the vagina.  Cervix visualized.  Cleaned with Betadine x 2.  Grasped anteriorly with a single tooth tenaculum.  Uterus sounded to 6.5 cm.  Mirena IUD placed per manufacturer's recommendations.  Strings trimmed to 3 cm. Tenaculum was removed, good hemostasis noted.  Patient tolerated procedure well.   Patient was given post-procedure instructions.  She was advised to have backup contraception for one week.  Patient was also asked to check IUD strings periodically and follow up in 4 weeks for IUD check.   Mariel Aloe, MD, FACOG Obstetrician & Gynecologist, [facility], [facility]

## 2022-01-04 NOTE — Progress Notes (Signed)
Pt is in office for IUD placement- Mirena. No recent intercourse.

## 2022-01-04 NOTE — Addendum Note (Signed)
Addended by: Marya Landry D on: 01/04/2022 11:22 AM   Modules accepted: Orders

## 2022-02-02 ENCOUNTER — Ambulatory Visit: Payer: Medicaid Other | Admitting: Obstetrics and Gynecology

## 2022-02-02 ENCOUNTER — Encounter: Payer: Self-pay | Admitting: Obstetrics and Gynecology

## 2022-02-02 VITALS — BP 135/89 | HR 119 | Wt 184.0 lb

## 2022-02-02 DIAGNOSIS — Z30431 Encounter for routine checking of intrauterine contraceptive device: Secondary | ICD-10-CM | POA: Diagnosis not present

## 2022-02-02 NOTE — Progress Notes (Signed)
41 yo here for IUD check. Patient had Mirena IUD inserted 12/2021. She reports some occasional cramping pain and reports no vaginal bleeding since its insertion. She has not been sexually active. She denies any pelvic pain or abnormal discharge  Past Medical History:  Diagnosis Date   Anxiety    Asthma    Bipolar 1 disorder ([place])    Bronchitis    Depression    Mental disorder    Scoliosis    Past Surgical History:  Procedure Laterality Date   FACIAL FRACTURE SURGERY Left    Family History  Problem Relation Age of Onset   Cancer Paternal Grandfather    Dementia Paternal Grandmother    Diabetes Mother    Social History   Tobacco Use   Smoking status: Former    Types: Cigarettes    Quit date: 12/23/2011    Years since quitting: 10.1   Smokeless tobacco: Former  Scientific laboratory technician Use: Never used  Substance Use Topics   Alcohol use: Not Currently    Comment: occ   Drug use: Yes    Types: Marijuana   ROS See pertinent in HPI. All other systems reviewed and non contributory Blood pressure 135/89, pulse (!) 119, weight 184 lb (83.5 kg), last menstrual period 12/15/2021. GENERAL: Well-developed, well-nourished female in no acute distress.  ABDOMEN: Soft, nontender, nondistended. No organomegaly. PELVIC: Normal external female genitalia. Vagina is pink and rugated.  Normal discharge. Normal appearing cervix with IUD strings extending into the posterior fornix. Chaperone present during the pelvic exam EXTREMITIES: No cyanosis, clubbing, or edema, 2+ distal pulses.  A/P 41 yo here for IUD check - IUD appears to be in the appropriate location - Patient is current on pap smear and mammography

## 2022-09-04 ENCOUNTER — Other Ambulatory Visit (HOSPITAL_BASED_OUTPATIENT_CLINIC_OR_DEPARTMENT_OTHER): Payer: Self-pay | Admitting: Internal Medicine

## 2022-09-04 DIAGNOSIS — Z1239 Encounter for other screening for malignant neoplasm of breast: Secondary | ICD-10-CM

## 2022-09-15 ENCOUNTER — Ambulatory Visit (HOSPITAL_BASED_OUTPATIENT_CLINIC_OR_DEPARTMENT_OTHER)
Admission: RE | Admit: 2022-09-15 | Discharge: 2022-09-15 | Disposition: A | Discharge status: Home / Self Care | Payer: Medicaid Other | Source: Ambulatory Visit | Attending: Internal Medicine | Admitting: Internal Medicine

## 2022-09-15 DIAGNOSIS — Z1231 Encounter for screening mammogram for malignant neoplasm of breast: Secondary | ICD-10-CM | POA: Diagnosis present

## 2022-09-15 DIAGNOSIS — Z1239 Encounter for other screening for malignant neoplasm of breast: Secondary | ICD-10-CM

## 2022-09-20 ENCOUNTER — Other Ambulatory Visit: Payer: Self-pay | Admitting: Internal Medicine

## 2022-09-20 DIAGNOSIS — R928 Other abnormal and inconclusive findings on diagnostic imaging of breast: Secondary | ICD-10-CM

## 2022-10-02 ENCOUNTER — Ambulatory Visit
Admission: RE | Admit: 2022-10-02 | Discharge: 2022-10-02 | Disposition: A | Discharge status: Home / Self Care | Payer: Medicaid Other | Source: Ambulatory Visit | Attending: Internal Medicine | Admitting: Internal Medicine

## 2022-10-02 DIAGNOSIS — R928 Other abnormal and inconclusive findings on diagnostic imaging of breast: Secondary | ICD-10-CM

## 2022-10-11 ENCOUNTER — Other Ambulatory Visit (HOSPITAL_COMMUNITY)
Admission: RE | Admit: 2022-10-11 | Discharge: 2022-10-11 | Disposition: A | Discharge status: Home / Self Care | Payer: Medicaid Other | Source: Ambulatory Visit | Attending: Obstetrics | Admitting: Obstetrics

## 2022-10-11 ENCOUNTER — Ambulatory Visit (INDEPENDENT_AMBULATORY_CARE_PROVIDER_SITE_OTHER): Payer: Medicaid Other | Admitting: Obstetrics

## 2022-10-11 ENCOUNTER — Encounter: Payer: Self-pay | Admitting: Obstetrics

## 2022-10-11 VITALS — BP 124/90 | HR 66 | Ht 65.0 in | Wt 183.6 lb

## 2022-10-11 DIAGNOSIS — N898 Other specified noninflammatory disorders of vagina: Secondary | ICD-10-CM

## 2022-10-11 DIAGNOSIS — Z01419 Encounter for gynecological examination (general) (routine) without abnormal findings: Secondary | ICD-10-CM

## 2022-10-11 DIAGNOSIS — Z30431 Encounter for routine checking of intrauterine contraceptive device: Secondary | ICD-10-CM

## 2022-10-11 DIAGNOSIS — Z113 Encounter for screening for infections with a predominantly sexual mode of transmission: Secondary | ICD-10-CM

## 2022-10-11 NOTE — Progress Notes (Signed)
Patient presents for AEX. Last Pap: 08/23/2021- +HPV Last Mammogram: 10/02/2022 Contraception: Mirena in place, satisfied with method. Placed 09/01/2021 Vaginal/Urinary Symptoms: Denies  STD Screen: Desires Vaginal Swab, blood work Other Concerns: None reported today

## 2022-10-11 NOTE — Progress Notes (Signed)
Subjective:        Katie Pope is a 42 y.o. female here for a routine exam.  Current complaints: Vaginal discharge.    Personal health questionnaire:  Is patient Ashkenazi Jewish, have a family history of breast and/or ovarian cancer: no Is there a family history of uterine cancer diagnosed at age < 62, gastrointestinal cancer, urinary tract cancer, family member who is a Personnel officer syndrome-associated carrier: no Is the patient overweight and hypertensive, family history of diabetes, personal history of gestational diabetes, preeclampsia or PCOS: no Is patient over 86, have PCOS,  family history of premature CHD under age 3, diabetes, smoke, have hypertension or peripheral artery disease:  no At any time, has a partner hit, kicked or otherwise hurt or frightened you?: no Over the past 2 weeks, have you felt down, depressed or hopeless?: no Over the past 2 weeks, have you felt little interest or pleasure in doing things?:no   Gynecologic History Patient's last menstrual period was 12/15/2021. Contraception: IUD Last Pap: 2023. Results were: normal Last mammogram: 2024. Results were: normal  Obstetric History OB History  Gravida Para Term Preterm AB Living  3 2 1 1   2   SAB IAB Ectopic Multiple Live Births          2    # Outcome Date GA Lbr Len/2nd Weight Sex Delivery Anes PTL Lv  3 Gravida           2 Preterm 09/26/16 [redacted]w[redacted]d  4 lb 9 oz (2.07 kg)  Vag-Spont   LIV  1 Term 08/24/08 [redacted]w[redacted]d  5 lb 6 oz (2.438 kg) F Vag-Spont       Past Medical History:  Diagnosis Date   Anxiety    Asthma    Bipolar 1 disorder (HCC)    Bronchitis    Depression    Mental disorder    Scoliosis     Past Surgical History:  Procedure Laterality Date   FACIAL FRACTURE SURGERY Left      Current Outpatient Medications:    atorvastatin (LIPITOR) 20 MG tablet, Take 20 mg by mouth at bedtime., Disp: , Rfl:    clonazePAM (KLONOPIN) 0.5 MG tablet, Take 0.5 mg by mouth 2 (two) times daily as  needed for anxiety., Disp: , Rfl:    cyproheptadine (PERIACTIN) 4 MG tablet, Take 4 mg by mouth 2 (two) times daily., Disp: , Rfl:    diclofenac (VOLTAREN) 75 MG EC tablet, Take 75 mg by mouth 2 (two) times daily., Disp: , Rfl:    escitalopram (LEXAPRO) 5 MG tablet, Take 5 mg by mouth daily., Disp: , Rfl:    EYSUVIS 0.25 % SUSP, Apply 1 drop to eye 2 (two) times daily., Disp: , Rfl:    gabapentin (NEURONTIN) 100 MG capsule, Take 100 mg by mouth 2 (two) times daily as needed., Disp: , Rfl:    lamoTRIgine (LAMICTAL) 25 MG tablet, Take 50 mg by mouth at bedtime., Disp: , Rfl:    OLANZapine (ZYPREXA) 10 MG tablet, Take 10 mg by mouth at bedtime., Disp: , Rfl:    OLANZapine-Samidorphan 5-10 MG TABS, Place 10 mg in mouth daily., Disp: , Rfl:    RESTASIS 0.05 % ophthalmic emulsion, Place 1 drop into both eyes 2 (two) times daily., Disp: , Rfl:  Allergies  Allergen Reactions   Other Rash    seafood   Rocephin [Ceftriaxone Sodium In Dextrose] Rash   Shellfish-Derived Products Rash    Social History   Tobacco Use  Smoking status: Former    Types: Cigarettes    Quit date: 12/23/2011    Years since quitting: 10.8   Smokeless tobacco: Former  Substance Use Topics   Alcohol use: Not Currently    Comment: occ    Family History  Problem Relation Age of Onset   Cancer Paternal Grandfather    Dementia Paternal Grandmother    Diabetes Mother       Review of Systems  Constitutional: negative for fatigue and weight loss Respiratory: negative for cough and wheezing Cardiovascular: negative for chest pain, fatigue and palpitations Gastrointestinal: negative for abdominal pain and change in bowel habits Musculoskeletal:negative for myalgias Neurological: negative for gait problems and tremors Behavioral/Psych: negative for abusive relationship, depression Endocrine: negative for temperature intolerance    Genitourinary:negative for abnormal menstrual periods, genital lesions, hot flashes,  sexual problems and vaginal discharge Integument/breast: negative for breast lump, breast tenderness, nipple discharge and skin lesion(s)    Objective:       BP (!) 124/90   Pulse 66   Ht 5\' 5"  (1.651 m)   Wt 183 lb 9.6 oz (83.3 kg)   LMP 12/15/2021   Breastfeeding Unknown   BMI 30.55 kg/m  General:   alert  Skin:   no rash or abnormalities  Lungs:   clear to auscultation bilaterally  Heart:   regular rate and rhythm, S1, S2 normal, no murmur, click, rub or gallop  Breasts:   normal without suspicious masses, skin or nipple changes or axillary nodes  Abdomen:  normal findings: no organomegaly, soft, non-tender and no hernia  Pelvis:  External genitalia: normal general appearance Urinary system: urethral meatus normal and bladder without fullness, nontender Vaginal: normal without tenderness, induration or masses Cervix: normal appearance Adnexa: normal bimanual exam Uterus: anteverted and non-tender, normal size   Lab Review Urine pregnancy test Labs reviewed yes Radiologic studies reviewed yes  I have spent a total of 20 minutes of face-to-face time, excluding clinical staff time, reviewing notes and preparing to see patient, ordering tests and/or medications, and counseling the patient.   Assessment:     1. Encounter for gynecological examination with Papanicolaou smear of cervix Rx: - Cytology - PAP( [place])  2. Vaginal discharge Rx: - Cervicovaginal ancillary only( )  3. Screening examination for STD (sexually transmitted disease) Rx: - RPR - HIV Antibody (routine testing w rflx) - Hepatitis B Surface AntiGEN - Hepatitis C Antibody  4. IUD check up - doing well    Plan:    Education reviewed: calcium supplements, depression evaluation, low fat, low cholesterol diet, safe sex/STD prevention, self breast exams, and weight bearing exercise. Contraception: IUD. Follow up in: 1 year.    Orders Placed This Encounter  Procedures   RPR    HIV Antibody (routine testing w rflx)   Hepatitis B Surface AntiGEN   Hepatitis C Antibody    Brock Bad, MD 10/11/2022 8:56 AM

## 2022-10-12 LAB — HEPATITIS C ANTIBODY: Hep C Virus Ab: NONREACTIVE

## 2022-10-12 LAB — HIV ANTIBODY (ROUTINE TESTING W REFLEX): HIV Screen 4th Generation wRfx: NONREACTIVE

## 2022-10-12 LAB — HEPATITIS B SURFACE ANTIGEN: Hepatitis B Surface Ag: NEGATIVE

## 2022-10-12 LAB — CERVICOVAGINAL ANCILLARY ONLY
Bacterial Vaginitis (gardnerella): NEGATIVE
Candida Glabrata: POSITIVE — AB
Candida Vaginitis: NEGATIVE
Chlamydia: NEGATIVE
Comment: NEGATIVE
Comment: NEGATIVE
Comment: NEGATIVE
Comment: NEGATIVE
Comment: NEGATIVE
Comment: NORMAL
Neisseria Gonorrhea: NEGATIVE
Trichomonas: NEGATIVE

## 2022-10-12 LAB — RPR: RPR Ser Ql: NONREACTIVE

## 2022-10-13 ENCOUNTER — Other Ambulatory Visit: Payer: Self-pay | Admitting: Obstetrics

## 2022-10-13 DIAGNOSIS — B379 Candidiasis, unspecified: Secondary | ICD-10-CM

## 2022-10-13 LAB — CYTOLOGY - PAP
Adequacy: ABSENT
Comment: NEGATIVE
Diagnosis: NEGATIVE
High risk HPV: NEGATIVE

## 2022-10-13 MED ORDER — AZO BORIC ACID 600 MG VA SUPP
1.0000 | Freq: Every day | VAGINAL | 0 refills | Status: DC
Start: 2022-10-13 — End: 2023-05-03

## 2022-10-13 MED ORDER — AZO BORIC ACID 600 MG VA SUPP
1.0000 | Freq: Every day | VAGINAL | 0 refills | Status: DC
Start: 2022-10-13 — End: 2022-10-13

## 2022-11-01 ENCOUNTER — Other Ambulatory Visit: Payer: Self-pay

## 2022-11-01 ENCOUNTER — Encounter (HOSPITAL_BASED_OUTPATIENT_CLINIC_OR_DEPARTMENT_OTHER): Payer: Self-pay | Admitting: Orthopedic Surgery

## 2022-11-08 ENCOUNTER — Ambulatory Visit (HOSPITAL_BASED_OUTPATIENT_CLINIC_OR_DEPARTMENT_OTHER)
Admission: RE | Admit: 2022-11-08 | Discharge: 2022-11-08 | Disposition: A | Discharge status: Home / Self Care | Payer: Medicaid Other | Attending: Orthopedic Surgery | Admitting: Orthopedic Surgery

## 2022-11-08 ENCOUNTER — Ambulatory Visit (HOSPITAL_BASED_OUTPATIENT_CLINIC_OR_DEPARTMENT_OTHER): Payer: Medicaid Other | Admitting: Anesthesiology

## 2022-11-08 ENCOUNTER — Encounter (HOSPITAL_BASED_OUTPATIENT_CLINIC_OR_DEPARTMENT_OTHER)
Admission: RE | Disposition: A | Discharge status: Home / Self Care | Payer: Self-pay | Source: Home / Self Care | Attending: Orthopedic Surgery

## 2022-11-08 ENCOUNTER — Other Ambulatory Visit: Payer: Self-pay

## 2022-11-08 ENCOUNTER — Encounter (HOSPITAL_BASED_OUTPATIENT_CLINIC_OR_DEPARTMENT_OTHER): Payer: Self-pay | Admitting: Orthopedic Surgery

## 2022-11-08 DIAGNOSIS — F419 Anxiety disorder, unspecified: Secondary | ICD-10-CM | POA: Diagnosis not present

## 2022-11-08 DIAGNOSIS — F319 Bipolar disorder, unspecified: Secondary | ICD-10-CM | POA: Diagnosis not present

## 2022-11-08 DIAGNOSIS — G5601 Carpal tunnel syndrome, right upper limb: Secondary | ICD-10-CM

## 2022-11-08 DIAGNOSIS — B977 Papillomavirus as the cause of diseases classified elsewhere: Secondary | ICD-10-CM

## 2022-11-08 DIAGNOSIS — Z01818 Encounter for other preprocedural examination: Secondary | ICD-10-CM

## 2022-11-08 DIAGNOSIS — J45909 Unspecified asthma, uncomplicated: Secondary | ICD-10-CM | POA: Diagnosis not present

## 2022-11-08 DIAGNOSIS — Z09 Encounter for follow-up examination after completed treatment for conditions other than malignant neoplasm: Secondary | ICD-10-CM | POA: Diagnosis not present

## 2022-11-08 DIAGNOSIS — Z87891 Personal history of nicotine dependence: Secondary | ICD-10-CM | POA: Diagnosis not present

## 2022-11-08 HISTORY — PX: CARPAL TUNNEL RELEASE: SHX101

## 2022-11-08 HISTORY — PX: TENOSYNOVECTOMY: SHX6110

## 2022-11-08 LAB — POCT PREGNANCY, URINE: Preg Test, Ur: NEGATIVE

## 2022-11-08 SURGERY — CARPAL TUNNEL RELEASE
Anesthesia: General | Site: Hand | Laterality: Right

## 2022-11-08 MED ORDER — OXYCODONE HCL 5 MG PO TABS: 5.0000 mg | ORAL_TABLET | Freq: Four times a day (QID) | ORAL | 0 refills | Status: AC | PRN

## 2022-11-08 MED ORDER — 0.9 % SODIUM CHLORIDE (POUR BTL) OPTIME
TOPICAL | Status: DC | PRN
  Administered 2022-11-08: 200 mL

## 2022-11-08 MED ORDER — LIDOCAINE 2% (20 MG/ML) 5 ML SYRINGE
INTRAMUSCULAR | Status: AC
  Filled 2022-11-08: qty 5

## 2022-11-08 MED ORDER — VANCOMYCIN HCL IN DEXTROSE 1-5 GM/200ML-% IV SOLN
INTRAVENOUS | Status: AC
  Filled 2022-11-08: qty 200

## 2022-11-08 MED ORDER — KETOROLAC TROMETHAMINE 30 MG/ML IJ SOLN
INTRAMUSCULAR | Status: AC
  Filled 2022-11-08: qty 1

## 2022-11-08 MED ORDER — LEVOFLOXACIN IN D5W 500 MG/100ML IV SOLN
INTRAVENOUS | Status: AC
  Filled 2022-11-08: qty 100

## 2022-11-08 MED ORDER — ONDANSETRON HCL 4 MG/2ML IJ SOLN
INTRAMUSCULAR | Status: AC
  Filled 2022-11-08: qty 2

## 2022-11-08 MED ORDER — LACTATED RINGERS IV SOLN: INTRAVENOUS | Status: DC

## 2022-11-08 MED ORDER — DEXAMETHASONE SODIUM PHOSPHATE 10 MG/ML IJ SOLN
INTRAMUSCULAR | Status: DC | PRN
  Administered 2022-11-08: 10 mg via INTRAVENOUS

## 2022-11-08 MED ORDER — ACETAMINOPHEN 10 MG/ML IV SOLN: 1000.0000 mg | Freq: Once | INTRAVENOUS | Status: DC | PRN

## 2022-11-08 MED ORDER — OXYCODONE HCL 5 MG PO TABS
ORAL_TABLET | ORAL | Status: AC
  Filled 2022-11-08: qty 1

## 2022-11-08 MED ORDER — MIDAZOLAM HCL 5 MG/5ML IJ SOLN
INTRAMUSCULAR | Status: DC | PRN
  Administered 2022-11-08: 2 mg via INTRAVENOUS

## 2022-11-08 MED ORDER — PROMETHAZINE HCL 25 MG/ML IJ SOLN: 6.2500 mg | INTRAMUSCULAR | Status: DC | PRN

## 2022-11-08 MED ORDER — FENTANYL CITRATE (PF) 100 MCG/2ML IJ SOLN
INTRAMUSCULAR | Status: DC | PRN
  Administered 2022-11-08: 50 ug via INTRAVENOUS
  Administered 2022-11-08: 25 ug via INTRAVENOUS
  Administered 2022-11-08: 50 ug via INTRAVENOUS
  Administered 2022-11-08 (×3): 25 ug via INTRAVENOUS

## 2022-11-08 MED ORDER — AMISULPRIDE (ANTIEMETIC) 5 MG/2ML IV SOLN: 10.0000 mg | Freq: Once | INTRAVENOUS | Status: DC | PRN

## 2022-11-08 MED ORDER — OXYCODONE HCL 5 MG/5ML PO SOLN: 5.0000 mg | Freq: Once | ORAL | Status: AC | PRN

## 2022-11-08 MED ORDER — FENTANYL CITRATE (PF) 100 MCG/2ML IJ SOLN
INTRAMUSCULAR | Status: AC
  Filled 2022-11-08: qty 2

## 2022-11-08 MED ORDER — KETOROLAC TROMETHAMINE 30 MG/ML IJ SOLN
30.0000 mg | Freq: Once | INTRAMUSCULAR | Status: AC | PRN
  Administered 2022-11-08: 30 mg via INTRAVENOUS

## 2022-11-08 MED ORDER — VANCOMYCIN HCL IN DEXTROSE 1-5 GM/200ML-% IV SOLN
1000.0000 mg | INTRAVENOUS | Status: AC
  Administered 2022-11-08: 1000 mg via INTRAVENOUS

## 2022-11-08 MED ORDER — BUPIVACAINE HCL 0.25 % IJ SOLN
INTRAMUSCULAR | Status: DC | PRN
  Administered 2022-11-08: 10 mL

## 2022-11-08 MED ORDER — PROPOFOL 10 MG/ML IV BOLUS
INTRAVENOUS | Status: AC
  Filled 2022-11-08: qty 20

## 2022-11-08 MED ORDER — OXYCODONE HCL 5 MG PO TABS
5.0000 mg | ORAL_TABLET | Freq: Once | ORAL | Status: AC | PRN
  Administered 2022-11-08: 5 mg via ORAL

## 2022-11-08 MED ORDER — ONDANSETRON HCL 4 MG/2ML IJ SOLN
INTRAMUSCULAR | Status: DC | PRN
Start: 2022-11-08 — End: 2022-11-08
  Administered 2022-11-08: 4 mg via INTRAVENOUS

## 2022-11-08 MED ORDER — DEXAMETHASONE SODIUM PHOSPHATE 10 MG/ML IJ SOLN
INTRAMUSCULAR | Status: AC
  Filled 2022-11-08: qty 1

## 2022-11-08 MED ORDER — PROPOFOL 10 MG/ML IV BOLUS
INTRAVENOUS | Status: DC | PRN
Start: 2022-11-08 — End: 2022-11-08
  Administered 2022-11-08: 200 mg via INTRAVENOUS

## 2022-11-08 MED ORDER — LIDOCAINE HCL (CARDIAC) PF 100 MG/5ML IV SOSY
PREFILLED_SYRINGE | INTRAVENOUS | Status: DC | PRN
  Administered 2022-11-08: 60 mg via INTRATRACHEAL

## 2022-11-08 MED ORDER — LEVOFLOXACIN IN D5W 500 MG/100ML IV SOLN
500.0000 mg | INTRAVENOUS | Status: AC
  Administered 2022-11-08: 500 mg via INTRAVENOUS

## 2022-11-08 MED ORDER — MIDAZOLAM HCL 2 MG/2ML IJ SOLN
INTRAMUSCULAR | Status: AC
  Filled 2022-11-08: qty 2

## 2022-11-08 MED ORDER — FENTANYL CITRATE (PF) 100 MCG/2ML IJ SOLN
25.0000 ug | INTRAMUSCULAR | Status: DC | PRN
  Administered 2022-11-08 (×2): 50 ug via INTRAVENOUS

## 2022-11-08 SURGICAL SUPPLY — 31 items
APL PRP STRL LF DISP 70% ISPRP (MISCELLANEOUS) ×1
BLADE SURG 15 STRL LF DISP TIS (BLADE) ×1 IMPLANT
BLADE SURG 15 STRL SS (BLADE) ×1
BNDG CMPR 5X3 KNIT ELC UNQ LF (GAUZE/BANDAGES/DRESSINGS) ×1
BNDG CMPR 9X4 STRL LF SNTH (GAUZE/BANDAGES/DRESSINGS) ×1
BNDG ELASTIC 3INX 5YD STR LF (GAUZE/BANDAGES/DRESSINGS) ×1 IMPLANT
BNDG ESMARK 4X9 LF (GAUZE/BANDAGES/DRESSINGS) ×1 IMPLANT
BNDG GAUZE DERMACEA FLUFF 4 (GAUZE/BANDAGES/DRESSINGS) ×1 IMPLANT
BNDG GZE DERMACEA 4 6PLY (GAUZE/BANDAGES/DRESSINGS) ×1
CHLORAPREP W/TINT 26 (MISCELLANEOUS) ×1 IMPLANT
CORD BIPOLAR FORCEPS 12FT (ELECTRODE) ×1 IMPLANT
COVER BACK TABLE 60X90IN (DRAPES) ×1 IMPLANT
CUFF TOURN SGL QUICK 18X4 (TOURNIQUET CUFF) ×1 IMPLANT
DRAPE EXTREMITY T 121X128X90 (DISPOSABLE) ×1 IMPLANT
DRAPE SURG 17X23 STRL (DRAPES) ×1 IMPLANT
GAUZE XEROFORM 1X8 LF (GAUZE/BANDAGES/DRESSINGS) ×1 IMPLANT
GLOVE BIO SURGEON STRL SZ7 (GLOVE) ×1 IMPLANT
GLOVE BIOGEL PI IND STRL 7.0 (GLOVE) ×1 IMPLANT
GOWN STRL REUS W/ TWL LRG LVL3 (GOWN DISPOSABLE) ×2 IMPLANT
GOWN STRL REUS W/TWL LRG LVL3 (GOWN DISPOSABLE) ×2
NDL HYPO 25X1 1.5 SAFETY (NEEDLE) ×1 IMPLANT
NEEDLE HYPO 25X1 1.5 SAFETY (NEEDLE) ×1 IMPLANT
NS IRRIG 1000ML POUR BTL (IV SOLUTION) ×1 IMPLANT
PACK BASIN DAY SURGERY FS (CUSTOM PROCEDURE TRAY) ×1 IMPLANT
SHEET MEDIUM DRAPE 40X70 STRL (DRAPES) ×1 IMPLANT
SUT ETHILON 4 0 PS 2 18 (SUTURE) ×1 IMPLANT
SUT MNCRL AB 4-0 PS2 18 (SUTURE) IMPLANT
SYR BULB EAR ULCER 3OZ GRN STR (SYRINGE) ×1 IMPLANT
SYR CONTROL 10ML LL (SYRINGE) ×1 IMPLANT
TOWEL GREEN STERILE FF (TOWEL DISPOSABLE) ×2 IMPLANT
UNDERPAD 30X36 HEAVY ABSORB (UNDERPADS AND DIAPERS) ×1 IMPLANT

## 2022-11-08 NOTE — H&P (Signed)
HAND SURGERY   HPI: Patient is a 42 y.o. female who presents with numbness and paresthesias involving the right thumb, index, and occasionally the middle finger.  She has failed conservative management so far with bracing, activity modification, oral anti-inflammatory medications, oral corticosteroid taper, and corticosteroid ejections to the carpal tunnel.  Her symptoms are quite bothersome and interfering with her daily activities.  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  Ultraound suggested that this intratendinous connection does not, however, MRI did not seem to show any connection.  The MRI did suggest edema within the carpal tunnel.  Patient denies any changes to their medical history or new systemic symptoms today.    Past Medical History:  Diagnosis Date   Anxiety    Asthma    Bipolar 1 disorder (HCC)    Bronchitis    Depression    Mental disorder    Scoliosis    Past Surgical History:  Procedure Laterality Date   FACIAL FRACTURE SURGERY Left    Social History   Socioeconomic History   Marital status: Single    Spouse name: Not on file   Number of children: Not on file   Years of education: Not on file   Highest education level: Not on file  Occupational History   Not on file  Tobacco Use   Smoking status: Former    Current packs/day: 0.00    Types: Cigarettes    Quit date: 12/23/2011    Years since quitting: 10.8   Smokeless tobacco: Former  Building services engineer status: Never Used  Substance and Sexual Activity   Alcohol use: Not Currently    Comment: occ   Drug use: Yes    Types: Marijuana    Comment: last use 10/30/2022   Sexual activity: Not Currently    Birth control/protection: I.U.D.  Other Topics Concern   Not on file  Social History Narrative   Not on file   Social Determinants of Health   Financial Resource Strain: Not at Risk (06/15/2022)    Received from [facility], [place]   Financial [employer]    Financial Resource Strain: 1  Food Insecurity: Not at Risk (06/15/2022)   Received from [place], [place]   Food Insecurity    Food: 1  Transportation Needs: Not at Risk (06/15/2022)   Received from [place], [employer] Needs    Transportation: 1  Physical Activity: Not on File (05/26/2022)   Received from [place], [place]   Physical Activity    Physical Activity: 0  Stress: Not on File (05/26/2022)   Received from [facility], [place]   Stress    Stress: 0  Social Connections: Not on File (05/26/2022)   Received from [place], [place]   Social Connections    Social Connections and Isolation: 0   Family History  Problem Relation Age of Onset   Cancer Paternal Grandfather    Dementia Paternal Grandmother    Diabetes Mother    - negative except otherwise stated in the family history section Allergies  Allergen Reactions   Other Rash    seafood   Rocephin [Ceftriaxone Sodium In Dextrose] Rash   Shellfish-Derived Products Rash   Prior to Admission medications   Medication Sig Start Date End Date Taking? Authorizing Provider  atorvastatin (LIPITOR) 20 MG tablet Take 20 mg by mouth at bedtime.   Yes [provider]  clonazePAM Scarlette Calico)  0.5 MG tablet Take 0.5 mg by mouth 2 (two) times daily as needed for anxiety.   Yes [provider]  cyproheptadine (PERIACTIN) 4 MG tablet Take 4 mg by mouth 2 (two) times daily. 07/21/21  Yes [provider]  escitalopram (LEXAPRO) 5 MG tablet Take 5 mg by mouth daily. 07/21/21  Yes [provider]  lamoTRIgine (LAMICTAL) 25 MG tablet Take 50 mg by mouth at bedtime. 07/21/21  Yes [provider]  OLANZapine (ZYPREXA) 10 MG tablet Take 10 mg by mouth at bedtime. 07/21/21  Yes [provider]  OLANZapine-Samidorphan 5-10 MG TABS Place 10 mg in mouth daily.   Yes [provider]  Boric Acid Vaginal (AZO BORIC ACID) 600 MG SUPP Place 1 suppository  vaginally at bedtime. 10/13/22   Brock Bad, MD  diclofenac (VOLTAREN) 75 MG EC tablet Take 75 mg by mouth 2 (two) times daily. 09/28/22   [provider]  EYSUVIS 0.25 % SUSP Apply 1 drop to eye 2 (two) times daily. 03/18/21   [provider]  gabapentin (NEURONTIN) 100 MG capsule Take 100 mg by mouth 2 (two) times daily as needed. 07/23/21   [provider]  RESTASIS 0.05 % ophthalmic emulsion Place 1 drop into both eyes 2 (two) times daily. 03/18/21   [provider]   No results found. - Positive ROS: All other systems have been reviewed and were otherwise negative with the exception of those mentioned in the HPI and as above.  Physical Exam: General: No acute distress, resting comfortably Cardiovascular: BUE warm and well perfused, normal rate Respiratory: Normal WOB on RA Skin: Warm and dry Neurologic: Sensation intact distally Psychiatric: Patient is at baseline mood and affect  Right upper Extremity  Swelling at the wrist or forearm, no ecchymosis.  Negative Tinel, Phalen, Durkan signs.  Persistent numbness at rest and the thumb, index, and part of the middle finger.  She is unable to independently flex the index DIP joint with the thumb IP held extended.  She is similarly unable to flex at the thumb IP joint with the index finger DIP joint held extended.  She has 5/5 thenar motor strength without atrophy.  Her hand is warm and well-perfused brisk capillary refill.  Assessment: 42 year old female with numbness and paresthesias in the median nerve distribution suggestive of potential carpal tunnel syndrome despite electrodiagnostic studies.  This is failed extensive conservative management including activity modification, bracing, oral anti-inflammatory medications, oral corticosteroid taper, and corticosteroid injection.  Plan: OR today for carpal tunnel release with possible division of interconnection between the FPL and FDP to the  index.. We again reviewed the risks of surgery which include, but are not limited to, bleeding, infection, damage to neurovascular structures, persistent symptoms, delayed wound healing, stiffness, need for additional surgery.  Informed consent was signed.  All questions were answered.   Marlyne Beards, M.D. [facility] 9:28 AM

## 2022-11-08 NOTE — Discharge Instructions (Addendum)
Waylan Rocher, M.D. Hand Surgery  POST-OPERATIVE DISCHARGE INSTRUCTIONS   PRESCRIPTIONS: You may have been given a prescription to be taken as directed for post-operative pain control.  You may also take over the counter ibuprofen/aleve and tylenol for pain. Take this as directed on the packaging. Do not exceed 3000 mg tylenol/acetaminophen in 24 hours.  Ibuprofen 600-800 mg (3-4) tablets by mouth every 6 hours as needed for pain.  OR Aleve 2 tablets by mouth every 12 hours (twice daily) as needed for pain.  AND/OR Tylenol 1000 mg (2 tablets) every 8 hours as needed for pain.  Please use your pain medication carefully, as refills are limited and you may not be provided with one.  As stated above, please use over the counter pain medicine - it will also be helpful with decreasing your swelling.    ANESTHESIA: After your surgery, post-surgical discomfort or pain is likely. This discomfort can last several days to a few weeks. At certain times of the day your discomfort may be more intense.   Did you receive a nerve block?  A nerve block can provide pain relief for one hour to two days after your surgery. As long as the nerve block is working, you will experience little or no sensation in the area the surgeon operated on.  As the nerve block wears off, you will begin to experience pain or discomfort. It is very important that you begin taking your prescribed pain medication before the nerve block fully wears off. Treating your pain at the first sign of the block wearing off will ensure your pain is better controlled and more tolerable when full-sensation returns. Do not wait until the pain is intolerable, as the medicine will be less effective. It is better to treat pain in advance than to try and catch up.   General Anesthesia:  If you did not receive a nerve block during your surgery, you will need to start taking your pain medication shortly after your surgery and should continue  to do so as prescribed by your surgeon.     ICE AND ELEVATION: You may use ice for the first 48-72 hours, but it is not critical.   Motion of your fingers is very important to decrease the swelling.  Elevation, as much as possible for the next 48 hours, is critical for decreasing swelling as well as for pain relief. Elevation means when you are seated or lying down, you hand should be at or above your heart. When walking, the hand needs to be at or above the level of your elbow.  If the bandage gets too tight, it may need to be loosened. Please contact our office and we will instruct you in how to do this.    SURGICAL BANDAGES:  Keep your dressing and/or splint clean and dry at all times.  Do not remove until you are seen again in the office.  If careful, you may place a plastic bag over your bandage and tape the end to shower, but be careful, do not get your bandages wet.     HAND THERAPY:  You may not need any. If you do, we will begin this at your follow up visit in the clinic.    ACTIVITY AND WORK: You are encouraged to move any fingers which are not in the bandage.  Light use of the fingers is allowed to assist the other hand with daily hygiene and eating, but strong gripping or lifting is often uncomfortable and  should be avoided.  You might miss a variable period of time from work and hopefully this issue has been discussed prior to surgery. You may not do any heavy work with your affected hand for about 2 weeks.    [facility] Second Floor, [address] [place], [place] [address] [phone]    Post Anesthesia Home Care Instructions  Activity: Get plenty of rest for the remainder of the day. A responsible individual must stay with you for 24 hours following the procedure.  For the next 24 hours, DO NOT: -Drive a car -Advertising copywriter -Drink alcoholic beverages -Take any medication unless instructed by your physician -Make any legal decisions or sign  important papers.  Meals: Start with liquid foods such as gelatin or soup. Progress to regular foods as tolerated. Avoid greasy, spicy, heavy foods. If nausea and/or vomiting occur, drink only clear liquids until the nausea and/or vomiting subsides. Call your physician if vomiting continues.  Special Instructions/Symptoms: Your throat may feel dry or sore from the anesthesia or the breathing tube placed in your throat during surgery. If this causes discomfort, gargle with warm salt water. The discomfort should disappear within 24 hours.  If you had a scopolamine patch placed behind your ear for the management of post- operative nausea and/or vomiting:  1. The medication in the patch is effective for 72 hours, after which it should be removed.  Wrap patch in a tissue and discard in the trash. Wash hands thoroughly with soap and water. 2. You may remove the patch earlier than 72 hours if you experience unpleasant side effects which may include dry mouth, dizziness or visual disturbances. 3. Avoid touching the patch. Wash your hands with soap and water after contact with the patch.

## 2022-11-08 NOTE — Transfer of Care (Signed)
Immediate Anesthesia Transfer of Care Note  Patient: Katie Pope  Procedure(s) Performed: EXTENSILE CARPAL TUNNEL RELEASE (Right: Hand) POSSIBLE TENOSYNOVECTOMY (Right: Hand)  Patient Location: PACU  Anesthesia Type:General  Level of Consciousness: drowsy and patient cooperative  Airway & Oxygen Therapy: Patient Spontanous Breathing and Patient connected to face mask oxygen  Post-op Assessment: Report given to RN and Post -op Vital signs reviewed and stable  Post vital signs: Reviewed and stable  Last Vitals:  Vitals Value Taken Time  BP    Temp    Pulse 57 11/08/22 1146  Resp    SpO2 97 % 11/08/22 1146  Vitals shown include unfiled device data.  Last Pain:  Vitals:   11/08/22 0856  TempSrc: Temporal  PainSc: 0-No pain      Patients Stated Pain Goal: 3 (11/08/22 0856)  Complications: No notable events documented.

## 2022-11-08 NOTE — Anesthesia Postprocedure Evaluation (Signed)
Anesthesia Post Note  Patient: Katie Pope  Procedure(s) Performed: EXTENSILE CARPAL TUNNEL RELEASE (Right: Hand) POSSIBLE TENOSYNOVECTOMY (Right: Hand)     Patient location during evaluation: PACU Anesthesia Type: General Level of consciousness: awake Pain management: pain level controlled Vital Signs Assessment: post-procedure vital signs reviewed and stable Respiratory status: spontaneous breathing, nonlabored ventilation and respiratory function stable Cardiovascular status: blood pressure returned to baseline and stable Postop Assessment: no apparent nausea or vomiting Anesthetic complications: no   No notable events documented.  Last Vitals:  Vitals:   11/08/22 1233 11/08/22 1252  BP: (!) 127/95 (!) 137/95  Pulse: 78 77  Resp: 15 14  Temp:  36.9 C  SpO2: 94% 95%    Last Pain:  Vitals:   11/08/22 1252  TempSrc:   PainSc: 7                  [name] [name]

## 2022-11-08 NOTE — Op Note (Signed)
Date of Surgery: 11/08/2022  INDICATIONS: Patient is a 42 y.o.-year-old female with numbness and paresthesias involving the right thumb, index, and part of her middle finger.  She has failed extensive conservative activity modification, oral anti-inflammatory occasions, oral corticosteroid taper, and carpal tunnel injection.  Ring also has obligate thumb IP joint flexion with index DIP flexion.  She is unable to actively flex at the thumb IP joint with the index DIP joint held in extension and vice versa.  This seems consistent with a Linburg-Comstock anomaly.  Risks, benefits, and alternatives to surgery were again discussed with the patient in the preoperative area. The patient wishes to proceed with surgery.  Informed consent was signed after our discussion.   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   PROCEDURE:  Extensile right carpal tunnel release Tenotomy of anomalous interconnection between FPL and FDP to index finger (32355) Extensive tenosynovectomy of the median nerve and all flexor tendons in the carpal tunnel   SURGEON: Waylan Rocher, M.D.  ASSIST: None  ANESTHESIA:  general, local  IV FLUIDS AND URINE: See anesthesia.  ESTIMATED BLOOD LOSS: <5 mL.  IMPLANTS: * No implants in log *   DRAINS: None  COMPLICATIONS: None  DESCRIPTION OF PROCEDURE: The patient was met in the preoperative holding area where the surgical site was marked and the consent form was signed.  The patient was then taken to the operating room and transferred to the operating table.  All bony prominences were well padded.  A tourniquet was applied to the right upper arm.  General endotracheal anesthesia was induced.  The operative extremity was prepped and draped in the usual and sterile fashion.  A formal time-out was performed to confirm that this was  the correct patient, surgery, side, and site.   Following formal timeout, the limb was gently exsanguinated with an Esmarch bandage and the tourniquet inflated to 250 mmHg.  I designed an extensile incision over the carpal tunnel, extending an oblique, Bruner fashion over the wrist flexion crease, extending into the distal forearm.  The skin was incised.  Blunt dissection was used to develop full-thickness skin flaps.  I began with a standard carpal tunnel release.  The longitudinal line fibers of the palm fascia were divided.  The underlying transverse carpal ligament was identified.  A small amount of thenar muscle was bluntly swept from the underlying transverse carpal ligament.  The transverse carpal ligament was divided longitudinally to the level of the fat surrounding the palmar arch.  Divided the distal antebrachial fascia of the forearm proximally into the distal forearm.  There was extensive inflammatory tenosynovial tissue surrounding the median nerve and the flexor tendons within the carpal tunnel.  A complete tenosynovectomy was performed to protect the neurovascular.  Flexor pollicis longus tendon was identified in the distal forearm proximally just flexion crease.  The adjacent flexor digitorum profundus to the index finger was identified.  Traction of the flexor pollicis longus tendon resulted and flexion of the index DIP joint and vice versa.  Further inspection identified anomalous tendon connection between the FPL tendon and the FDP to the index.  This anomalous tendon was divided.  Traction on the FPL and FDP tendons at this point resulted in the independent thumb IP and index DIP flexion.  Following complete tenosynovectomy, extensile carpal tunnel release, and division of this anomalous intratendinous connection, wound was thoroughly irrigated with  copious sterile saline.  The tourniquet was deflated.  Hemostasis was achieved with direct pressure over the wound and with bipolar  electrocautery.  The wound was then closed in layered fashion using a 4-0 Monocryl suture in a buried interrupted fashion followed by a 4-0 nylon suture in horizontal mattress fashion.  Local block was performed using 10 cc of quarter percent Marcaine.  The wound was then dressed with Xeroform, folded Kerlix, and an Ace wrap.  The patient was reversed from anesthesia and extubated uneventfully.  They were transferred from the operating table to the postoperative bed.  All counts were correct x 2 at the end of the procedure.  The patient was then taken to the PACU in stable condition.   POSTOPERATIVE PLAN: Patient will be discharged home with appropriate pain medication and discharge instructions.  I will see her back in 10 to 14 days for her first postoperative visit.  Waylan Rocher, MD 11:49 AM

## 2022-11-08 NOTE — Interval H&P Note (Signed)
History and Physical Interval Note:  11/08/2022 9:32 AM  Katie Pope  has presented today for surgery, with the diagnosis of right carpal tunnel syndrome.  The various methods of treatment have been discussed with the patient and family. After consideration of risks, benefits and other options for treatment, the patient has consented to  Procedure(s) with comments: EXTENSILE CARPAL TUNNEL RELEASE (Right) - regional 60 POSSIBLE TENOSYNOVECTOMY (Right) as a surgical intervention.  The patient's history has been reviewed, patient examined, no change in status, stable for surgery.  I have reviewed the patient's chart and labs.  Questions were answered to the patient's satisfaction.     Ilithyia Titzer Menaal Russum

## 2022-11-08 NOTE — Anesthesia Preprocedure Evaluation (Addendum)
Anesthesia Evaluation  Patient identified by MRN, date of birth, ID band Patient awake    Reviewed: Allergy & Precautions, NPO status , Patient's Chart, lab work & pertinent test results  Airway Mallampati: II  TM Distance: >3 FB Neck ROM: Full    Dental  (+) Edentulous Lower, Edentulous Upper   Pulmonary asthma , former smoker   Pulmonary exam normal        Cardiovascular negative cardio ROS Normal cardiovascular exam     Neuro/Psych  PSYCHIATRIC DISORDERS Anxiety Depression Bipolar Disorder   negative neurological ROS     GI/Hepatic negative GI ROS,,,(+)     substance abuse    Endo/Other  negative endocrine ROS    Renal/GU negative Renal ROS     Musculoskeletal Scoliosis   Abdominal  (+) + obese  Peds  Hematology negative hematology ROS (+)   Anesthesia Other Findings right carpal tunnel syndrome  Reproductive/Obstetrics Hcg negative                             Anesthesia Physical Anesthesia Plan  ASA: 2  Anesthesia Plan: General   Post-op Pain Management:    Induction: Intravenous  PONV Risk Score and Plan: 3 and Ondansetron, Dexamethasone, Treatment may vary due to age or medical condition and Midazolam  Airway Management Planned: LMA  Additional Equipment:   Intra-op Plan:   Post-operative Plan: Extubation in OR  Informed Consent: I have reviewed the patients History and Physical, chart, labs and discussed the procedure including the risks, benefits and alternatives for the proposed anesthesia with the patient or authorized representative who has indicated his/her understanding and acceptance.     Dental advisory given  Plan Discussed with: CRNA  Anesthesia Plan Comments:        Anesthesia Quick Evaluation

## 2022-11-08 NOTE — Anesthesia Procedure Notes (Signed)
Procedure Name: LMA Insertion Date/Time: 11/08/2022 10:36 AM  Performed by: Thornell Mule, CRNAPre-anesthesia Checklist: Patient identified, Emergency Drugs available, Suction available and Patient being monitored Patient Re-evaluated:Patient Re-evaluated prior to induction Oxygen Delivery Method: Circle system utilized Preoxygenation: Pre-oxygenation with 100% oxygen Induction Type: IV induction LMA: LMA inserted LMA Size: 4.0 Number of attempts: 1 Placement Confirmation: positive ETCO2 Tube secured with: Tape Dental Injury: Teeth and Oropharynx as per pre-operative assessment

## 2022-11-09 ENCOUNTER — Encounter (HOSPITAL_BASED_OUTPATIENT_CLINIC_OR_DEPARTMENT_OTHER): Payer: Self-pay | Admitting: Orthopedic Surgery

## 2022-12-22 ENCOUNTER — Emergency Department (HOSPITAL_BASED_OUTPATIENT_CLINIC_OR_DEPARTMENT_OTHER)
Admission: EM | Admit: 2022-12-22 | Discharge: 2022-12-23 | Disposition: A | Discharge status: Home / Self Care | Payer: Medicaid Other | Attending: Emergency Medicine | Admitting: Emergency Medicine

## 2022-12-22 ENCOUNTER — Emergency Department (HOSPITAL_BASED_OUTPATIENT_CLINIC_OR_DEPARTMENT_OTHER): Payer: Medicaid Other

## 2022-12-22 DIAGNOSIS — R0602 Shortness of breath: Secondary | ICD-10-CM | POA: Insufficient documentation

## 2022-12-22 DIAGNOSIS — R079 Chest pain, unspecified: Secondary | ICD-10-CM | POA: Diagnosis present

## 2022-12-22 DIAGNOSIS — R0789 Other chest pain: Secondary | ICD-10-CM | POA: Insufficient documentation

## 2022-12-22 LAB — CBC
HCT: 42.1 % (ref 36.0–46.0)
Hemoglobin: 14.6 g/dL (ref 12.0–15.0)
MCH: 31.1 pg (ref 26.0–34.0)
MCHC: 34.7 g/dL (ref 30.0–36.0)
MCV: 89.6 fL (ref 80.0–100.0)
Platelets: 255 10*3/uL (ref 150–400)
RBC: 4.7 MIL/uL (ref 3.87–5.11)
RDW: 13.2 % (ref 11.5–15.5)
WBC: 11.2 10*3/uL — ABNORMAL HIGH (ref 4.0–10.5)
nRBC: 0 % (ref 0.0–0.2)

## 2022-12-22 LAB — D-DIMER, QUANTITATIVE: D-Dimer, Quant: 2.8 ug{FEU}/mL — ABNORMAL HIGH (ref 0.00–0.50)

## 2022-12-22 LAB — BASIC METABOLIC PANEL
Anion gap: 12 (ref 5–15)
BUN: 12 mg/dL (ref 6–20)
CO2: 22 mmol/L (ref 22–32)
Calcium: 9.3 mg/dL (ref 8.9–10.3)
Chloride: 105 mmol/L (ref 98–111)
Creatinine, Ser: 0.87 mg/dL (ref 0.44–1.00)
GFR, Estimated: >60 mL/min (ref >60)
Glucose, Bld: 89 mg/dL (ref 70–99)
Potassium: 3.5 mmol/L (ref 3.5–5.1)
Sodium: 139 mmol/L (ref 135–145)

## 2022-12-22 LAB — TROPONIN I (HIGH SENSITIVITY)
Troponin I (High Sensitivity): <2 ng/L (ref <18)
Troponin I (High Sensitivity): 2 ng/L (ref <18)

## 2022-12-22 LAB — PREGNANCY, URINE: Preg Test, Ur: NEGATIVE

## 2022-12-22 NOTE — ED Provider Notes (Signed)
EMERGENCY DEPARTMENT AT [facility] Provider Note   CSN: [id number] Arrival date & time: 12/22/22  1802     History Chief Complaint  Patient presents with   Chest Pain    Katie Pope is a 42 y.o. female with h/o bipolar, anxiety, depression, and HLD presents to the ER for evaluation of chest pain onset 1100 today. She reports she has had chest pain off and on for the past few months, but she wanted to be evaluated today.  The patient reports that she was sitting and driving started to have chest pain to her left.  She reports it went to her shoulder and around her axilla and some in her back.  She denies any lightheadedness, near syncope, nausea, diaphoresis.  She reports that she felt some mild shortness of breath.  Not exertional.  She reports that sometimes her movement worsens her pain and sometimes it improves it.  She does not feel pain with deep inspiration.  She denies any trauma to the area.  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  No exogenous hormone use.  No unilateral leg swelling.  She denies any vomiting, diarrhea, constipation, cough, fever, rhinorrhea, nasal congestion, or palpitations.  She denies any recent illnesses.  She reports occasional marijuana use but denies any other drug use.  Denies any EtOH or illicit drug use.  She reports compliancy to her medications.   Chest Pain Associated symptoms: shortness of breath   Associated symptoms: no abdominal pain, no cough, no fever, no headache, no nausea, no palpitations and no vomiting        Home Medications Prior to Admission medications   Medication Sig Start Date End Date Taking? Authorizing Provider  atorvastatin (LIPITOR) 20 MG tablet Take 20 mg by mouth at bedtime.    [provider]  Boric Acid Vaginal (AZO BORIC ACID) 600 MG SUPP Place 1 suppository vaginally at bedtime. 10/13/22   Brock Bad, MD   clonazePAM (KLONOPIN) 0.5 MG tablet Take 0.5 mg by mouth 2 (two) times daily as needed for anxiety.    [provider]  cyproheptadine (PERIACTIN) 4 MG tablet Take 4 mg by mouth 2 (two) times daily. 07/21/21   [provider]  diclofenac (VOLTAREN) 75 MG EC tablet Take 75 mg by mouth 2 (two) times daily. 09/28/22   [provider]  escitalopram (LEXAPRO) 5 MG tablet Take 5 mg by mouth daily. 07/21/21   [provider]  EYSUVIS 0.25 % SUSP Apply 1 drop to eye 2 (two) times daily. 03/18/21   [provider]  gabapentin (NEURONTIN) 100 MG capsule Take 100 mg by mouth 2 (two) times daily as needed. 07/23/21   [provider]  lamoTRIgine (LAMICTAL) 25 MG tablet Take 50 mg by mouth at bedtime. 07/21/21   [provider]  OLANZapine (ZYPREXA) 10 MG tablet Take 10 mg by mouth at bedtime. 07/21/21   [provider]  OLANZapine-Samidorphan 5-10 MG TABS Place 10 mg in mouth daily.    [provider]  RESTASIS 0.05 % ophthalmic emulsion Place 1 drop into both eyes 2 (two) times daily. 03/18/21   [provider]      Allergies    Other, Rocephin [ceftriaxone sodium in dextrose], and Shellfish-derived products    Review of Systems   Review of Systems  Constitutional:  Negative for chills and fever.  HENT:  Negative for congestion and rhinorrhea.   Respiratory:  Positive for shortness of breath. Negative for cough, chest tightness and wheezing.   Cardiovascular:  Positive for chest pain. Negative for palpitations and leg swelling.  Gastrointestinal:  Negative for abdominal pain, constipation, diarrhea, nausea and vomiting.  Neurological:  Negative for syncope, light-headedness and headaches.    Physical Exam Updated Vital Signs BP 98/74 (BP Location: Right Arm)   Pulse (!) 54   Temp 98.7 F (37.1 C) (Oral)   Resp 18   Ht 5\' 5"  (1.651 m)   Wt 83 kg   LMP 12/19/2022 (Approximate)   SpO2 99%   BMI 30.45 kg/m   Physical Exam Vitals and nursing note reviewed.  Constitutional:      General: She is not in acute distress.    Appearance: She is not ill-appearing or toxic-appearing.     Comments: On phone, comfortably lying on stretcher, in no acute distress  Cardiovascular:     Rate and Rhythm: Bradycardia present.     Pulses:          Radial pulses are 2+ on the right side and 2+ on the left side.       Dorsalis pedis pulses are 2+ on the right side and 2+ on the left side.       Posterior tibial pulses are 2+ on the right side and 2+ on the left side.  Pulmonary:     Effort: Pulmonary effort is normal. No respiratory distress.     Comments: Some faint rhonchi heard in right lower base.  No wheezing or stridor auscultated.  Patient speaking in full sentences with ease and is satting well on room air without increased work of breathing. Chest:     Chest wall: Tenderness present.       Comments: Tenderness to the marked area above however I am not able to reproduce her pain into her shoulder, axilla, or back.  No overlying skin changes noted to the area.  No rash.  No fluctuance induration.  No increased erythema or warmth. Musculoskeletal:     Cervical back: Normal range of motion.     Right lower leg: No tenderness. No edema.     Left lower leg: No tenderness. No edema.  Skin:    General: Skin is warm and dry.  Neurological:     General: No focal deficit present.     Mental Status: She is alert.     ED Results / Procedures / Treatments   Labs (all labs ordered are listed, but only abnormal results are displayed) Labs Reviewed  CBC - Abnormal; Notable for the following components:      Result Value   WBC 11.2 (*)    All other components within normal limits  BASIC METABOLIC PANEL  PREGNANCY, URINE  D-DIMER, QUANTITATIVE  TROPONIN I (HIGH SENSITIVITY)  TROPONIN I (HIGH SENSITIVITY)    EKG EKG Interpretation Date/Time:  Friday December 22 2022 18:15:21 EDT Ventricular Rate:   61 PR Interval:  164 QRS Duration:  82 QT Interval:  412 QTC Calculation: 414 R Axis:   86  Text Interpretation: Normal sinus rhythm Normal ECG No previous ECGs available normal Confirmed by Arby Barrette [id number]) on 12/22/2022 11:11:20 PM  Radiology DG Chest Port 1 View  Result Date: 12/22/2022 CLINICAL DATA:  Chest pain. Left-sided pain radiating to shoulder and left side of neck. EXAM: PORTABLE CHEST 1 VIEW COMPARISON:  01/14/2016 FINDINGS: The heart is normal in size. Mediastinal contours are normal.  Suggestion of mild diffuse interstitial coarsening and bronchial thickening. No focal airspace disease, large pleural effusion, pneumothorax or pulmonary edema. Scoliotic curvature of the spine, unchanged. IMPRESSION: Suggestion of mild diffuse interstitial coarsening and bronchial thickening, can be seen with bronchitis or asthma. Atypical infection is also considered. Electronically Signed   By: Narda Rutherford M.D.   On: 12/22/2022 19:52    Procedures Procedures   Medications Ordered in ED Medications - No data to display  ED Course/ Medical Decision Making/ A&P                                Medical Decision Making Amount and/or Complexity of Data Reviewed Labs: ordered. Radiology: ordered.  Risk Prescription drug management.   42 y.o. female presents to the ER for evaluation of chest pain. Differential diagnosis includes but is not limited to ACS, pericarditis, myocarditis, aortic dissection, PE, pneumothorax, esophageal spasm or rupture, chronic angina, pneumonia, bronchitis, GERD, reflux/PUD, biliary disease, pancreatitis, costochondritis, anxiety. Vital signs mild bradycardia, otherwise unremarkable. Physical exam as noted above.   The patient did have recent surgery last month and also spends prolonged amount of time in a car she otherwise is low Wells criteria but fails PERC criteria.  I independently reviewed and interpreted the patient's labs.  BMP shows no  electrolyte abnormality.  CBC shows slight increase in white blood cell, 1.2 otherwise no anemia.  Troponin at less than 2 with repeat at 2.  Pregnancy test is negative.  D-dimer is elevated at 2.80.  Chest x-ray shows Suggestion of mild diffuse interstitial coarsening and bronchial thickening, can be seen with bronchitis or asthma. Atypical infection is also considered. She is not having any cough, rhinorrhea, nasal congestion, fever. I do not appreciate any wheezing.   EKG reviewed and interpreted by attending and read as normal sinus rhythm Normal ECG No previous ECGs available normal.  Given her D-dimer is elevated, will need to order CT angio to rule out any PE.  12:32 AM Hand off to on coming shift, Dr. Wilkie Aye to follow up with CT imaging. Dispo to be determined at this time.   Portions of this report may have been transcribed using voice recognition software. Every effort was made to ensure accuracy; however, inadvertent computerized transcription errors may be present.   Final Clinical Impression(s) / ED Diagnoses Final diagnoses:  Nonspecific chest pain    Rx / DC Orders ED Discharge Orders     None         Achille Rich, PA-C 12/23/22 0038    Arby Barrette, MD 01/05/23 [phone]

## 2022-12-22 NOTE — ED Triage Notes (Signed)
Pt caox4 c/o CP on the left side radiating to L shoulder and L side of neck. Pt states pain started while driving and has been constant since with intermittent nausea, denies nausea at present. Denies SOB. Pt reports she has had this CP in the past multiple times however has not been evaluated for same.

## 2022-12-23 ENCOUNTER — Emergency Department (HOSPITAL_BASED_OUTPATIENT_CLINIC_OR_DEPARTMENT_OTHER): Payer: Medicaid Other

## 2022-12-23 MED ORDER — IOHEXOL 350 MG/ML SOLN
100.0000 mL | Freq: Once | INTRAVENOUS | Status: AC | PRN
  Administered 2022-12-23: 75 mL via INTRAVENOUS

## 2022-12-23 NOTE — ED Provider Notes (Incomplete)
[name] Provider Note   CSN: [id number] Arrival date & time: 12/22/22  1802     History Chief Complaint  Patient presents with  . Chest Pain    Katie Pope is a 42 y.o. female with h/o bipolar, anxiety, depression, and HLD presents to the ER for evaluation of chest pain onset 1100 today. She reports she has had chest pain off and on for the past few months, but she wanted to be evaluated today.  The patient reports that she was sitting and driving started to have chest pain to her left.  She reports it went to her shoulder and around her axilla and some in her back.  She denies any lightheadedness, near syncope, nausea, diaphoresis.  She reports that she felt some mild shortness of breath.  Not exertional.  She reports that sometimes her movement worsens her pain and sometimes it improves it.  She does not feel pain with deep inspiration.  She denies any trauma to the area.  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  No exogenous hormone use.  No unilateral leg swelling.  She denies any vomiting, diarrhea, constipation, cough, fever, rhinorrhea, nasal congestion, or palpitations.  She denies any recent illnesses.  She reports occasional marijuana use but denies any other drug use.  Denies any EtOH or illicit drug use.  She reports compliancy to her medications.   Chest Pain      Home Medications Prior to Admission medications   Medication Sig Start Date End Date Taking? Authorizing Provider  atorvastatin (LIPITOR) 20 MG tablet Take 20 mg by mouth at bedtime.    [provider]  Boric Acid Vaginal (AZO BORIC ACID) 600 MG SUPP Place 1 suppository vaginally at bedtime. 10/13/22   Brock Bad, MD  clonazePAM (KLONOPIN) 0.5 MG tablet Take 0.5 mg by mouth 2 (two) times daily as needed for anxiety.    [provider]  cyproheptadine (PERIACTIN) 4 MG  tablet Take 4 mg by mouth 2 (two) times daily. 07/21/21   [provider]  diclofenac (VOLTAREN) 75 MG EC tablet Take 75 mg by mouth 2 (two) times daily. 09/28/22   [provider]  escitalopram (LEXAPRO) 5 MG tablet Take 5 mg by mouth daily. 07/21/21   [provider]  EYSUVIS 0.25 % SUSP Apply 1 drop to eye 2 (two) times daily. 03/18/21   [provider]  gabapentin (NEURONTIN) 100 MG capsule Take 100 mg by mouth 2 (two) times daily as needed. 07/23/21   [provider]  lamoTRIgine (LAMICTAL) 25 MG tablet Take 50 mg by mouth at bedtime. 07/21/21   [provider]  OLANZapine (ZYPREXA) 10 MG tablet Take 10 mg by mouth at bedtime. 07/21/21   [provider]  OLANZapine-Samidorphan 5-10 MG TABS Place 10 mg in mouth daily.    [provider]  RESTASIS 0.05 % ophthalmic emulsion Place 1 drop into both eyes 2 (two) times daily. 03/18/21   [provider]      Allergies    Other, Rocephin [ceftriaxone sodium in dextrose], and Shellfish-derived products    Review of Systems   Review of Systems  Cardiovascular:  Positive for chest pain.    Physical Exam Updated Vital Signs BP 98/74 (BP Location: Right Arm)   Pulse (!) 54   Temp 98.7 F (37.1 C) (Oral)   Resp 18  Ht 5\' 5"  (1.651 m)   Wt 83 kg   LMP 12/19/2022 (Approximate)   SpO2 99%   BMI 30.45 kg/m  Physical Exam  ED Results / Procedures / Treatments   Labs (all labs ordered are listed, but only abnormal results are displayed) Labs Reviewed  CBC - Abnormal; Notable for the following components:      Result Value   WBC 11.2 (*)    All other components within normal limits  BASIC METABOLIC PANEL  PREGNANCY, URINE  D-DIMER, QUANTITATIVE  TROPONIN I (HIGH SENSITIVITY)  TROPONIN I (HIGH SENSITIVITY)    EKG None  Radiology DG Chest Port 1 View  Result Date: 12/22/2022 CLINICAL DATA:  Chest pain. Left-sided pain radiating to shoulder and left side of  neck. EXAM: PORTABLE CHEST 1 VIEW COMPARISON:  01/14/2016 FINDINGS: The heart is normal in size. Mediastinal contours are normal. Suggestion of mild diffuse interstitial coarsening and bronchial thickening. No focal airspace disease, large pleural effusion, pneumothorax or pulmonary edema. Scoliotic curvature of the spine, unchanged. IMPRESSION: Suggestion of mild diffuse interstitial coarsening and bronchial thickening, can be seen with bronchitis or asthma. Atypical infection is also considered. Electronically Signed   By: Narda Rutherford M.D.   On: 12/22/2022 19:52    Procedures Procedures  {Document cardiac monitor, telemetry assessment procedure when appropriate:1}  Medications Ordered in ED Medications - No data to display  ED Course/ Medical Decision Making/ A&P   {   Click here for ABCD2, HEART and other calculatorsREFRESH Note before signing :1}                              Medical Decision Making Amount and/or Complexity of Data Reviewed Labs: ordered. Radiology: ordered.   ***  {Document critical care time when appropriate:1} {Document review of labs and clinical decision tools ie heart score, Chads2Vasc2 etc:1}  {Document your independent review of radiology images, and any outside records:1} {Document your discussion with family members, caretakers, and with consultants:1} {Document social determinants of health affecting pt's care:1} {Document your decision making why or why not admission, treatments were needed:1} Final Clinical Impression(s) / ED Diagnoses Final diagnoses:  None    Rx / DC Orders ED Discharge Orders     None

## 2022-12-23 NOTE — ED Provider Notes (Signed)
Patient signed out pending CT PE study.  CT PE study does not show any evidence of acute pulmonary embolism.  She does have some evidence of small airway disease or bronchitis.  Clinically not consistent with her symptoms.  She also has some evidence of esophagitis/reflux.  Will discharge home with cardiology and PCP follow-up.   Shon Baton, MD 12/23/22 [phone]

## 2022-12-23 NOTE — Discharge Instructions (Addendum)
You were seen in the ER for evaluation of your chest pain. Please make sure to follow up with your Primary Care Provider for re-evaluation. I have sent in a referral to the cardiologist as well. If you have not heard back from them in a few days, please call to schedule an appointment. If you have any concerns, new or worsening symptoms, please return to the nearest ER for re-evaluation.  Your CT scan does not show any evidence of blood clots.  There is some indication that you may have gastroesophageal reflux.  You may start Prilosec as an outpatient if you feel that you have symptoms related to food intake.  Contact a doctor if: Your chest pain does not go away. You feel depressed. You have a fever. Get help right away if: Your chest pain is worse. You have a cough that gets worse, or you cough up blood. You have very bad (severe) pain in your belly (abdomen). You pass out (faint). You have either of these for no clear reason: Sudden chest discomfort. Sudden discomfort in your arms, back, neck, or jaw. You have shortness of breath at any time. You suddenly start to sweat, or your skin gets clammy. You feel sick to your stomach (nauseous). You throw up (vomit). You suddenly feel lightheaded or dizzy. You feel very weak or tired. Your heart starts to beat fast, or it feels like it is skipping beats. These symptoms may be an emergency. Do not wait to see if the symptoms will go away. Get medical help right away. Call your local emergency services (911 in the [place] Do not drive yourself to the hospital.

## 2023-01-01 ENCOUNTER — Other Ambulatory Visit: Payer: Self-pay | Admitting: Medical Genetics

## 2023-01-01 DIAGNOSIS — Z006 Encounter for examination for normal comparison and control in clinical research program: Secondary | ICD-10-CM

## 2023-02-05 ENCOUNTER — Ambulatory Visit: Payer: Medicaid Other | Admitting: Obstetrics and Gynecology

## 2023-02-20 NOTE — Progress Notes (Signed)
Cardiology Office Note   Date:  02/23/2023   ID:  Katie Pope, DOB December 16, 1980, MRN [id number]  PCP:  Woodfin Ganja, MD  Cardiologist:   Rollene Rotunda, MD Referring:  ED  Chief Complaint  Patient presents with   Chest Pain      History of Present Illness: Katie Pope is a 42 y.o. female who presents for evaluation of chest pain.  She was in the emergency room recently for this.  I reviewed these records.  She said this has been happening sporadically.  It is in her upper chest and radiates around to her back.  It seems to be between a pressing discomfort versus a knifelike discomfort.  It can be 6 out of 10 in intensity.  She is gotten somewhat nauseated with it.  It happens at rest.  It would last for few minutes.  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  She has not had any prior cardiac history or workup.  In the emergency room she did have a mildly elevated D-dimer but a CT demonstrated no pulmonary embolism.  Enzymes were negative.  EKG was unremarkable.  There was no coronary calcium.  She did have distal esophageal thickening.  She is being scheduled for an EGD.     Past Medical History:  Diagnosis Date   Anxiety    Asthma    Bipolar 1 disorder (HCC)    Bronchitis    Depression    Dyslipidemia    Scoliosis     Past Surgical History:  Procedure Laterality Date   CARPAL TUNNEL RELEASE Right 11/08/2022   Procedure: EXTENSILE CARPAL TUNNEL RELEASE;  Surgeon: Marlyne Beards, MD;  Location: [facility];  Service: Orthopedics;  Laterality: Right;  regional 60   FACIAL FRACTURE SURGERY Left    TENOSYNOVECTOMY Right 11/08/2022   Procedure: POSSIBLE TENOSYNOVECTOMY;  Surgeon: Marlyne Beards, MD;  Location: [facility];  Service: Orthopedics;  Laterality: Right;     Current Outpatient Medications  Medication Sig Dispense Refill   atorvastatin (LIPITOR) 20 MG tablet Take 20 mg by  mouth at bedtime.     clonazePAM (KLONOPIN) 0.5 MG tablet Take 0.5 mg by mouth 2 (two) times daily as needed for anxiety.     cyproheptadine (PERIACTIN) 4 MG tablet Take 4 mg by mouth 2 (two) times daily.     escitalopram (LEXAPRO) 5 MG tablet Take 5 mg by mouth daily.     EYSUVIS 0.25 % SUSP Apply 1 drop to eye 2 (two) times daily.     gabapentin (NEURONTIN) 100 MG capsule Take 100 mg by mouth 2 (two) times daily as needed.     lamoTRIgine (LAMICTAL) 25 MG tablet Take 50 mg by mouth at bedtime.     OLANZapine (ZYPREXA) 10 MG tablet Take 10 mg by mouth at bedtime.     RESTASIS 0.05 % ophthalmic emulsion Place 1 drop into both eyes 2 (two) times daily.     Boric Acid Vaginal (AZO BORIC ACID) 600 MG SUPP Place 1 suppository vaginally at bedtime. (Patient not taking: Reported on 02/23/2023) 14 suppository 0   diclofenac (VOLTAREN) 75 MG EC tablet Take 75 mg by mouth 2 (two) times daily. (Patient not taking: Reported on 02/23/2023)     OLANZapine-Samidorphan 5-10 MG TABS Place 10 mg in mouth daily. (Patient not taking: Reported on 02/23/2023)     No current facility-administered medications for this visit.  Allergies:   Other, Rocephin [ceftriaxone sodium in dextrose], and Shellfish-derived products    Social History:  The patient  reports that she quit smoking about 11 years ago. Her smoking use included cigarettes. She has quit using smokeless tobacco. She reports that she does not currently use alcohol. She reports current drug use. Drug: Marijuana.   Family History:  The patient's family history includes Cancer in her paternal grandfather; Dementia in her paternal grandmother; Diabetes in her mother.    ROS:  Please see the history of present illness.   Otherwise, review of systems are positive for none.   All other systems are reviewed and negative.    PHYSICAL EXAM: VS:  BP 118/78   Pulse 84   Ht 5\' 5"  (1.651 m)   Wt 184 lb 9.6 oz (83.7 kg)   SpO2 97%   BMI 30.72 kg/m  , BMI Body  mass index is 30.72 kg/m. GENERAL:  Well appearing HEENT:  Pupils equal round and reactive, fundi not visualized, oral mucosa unremarkable NECK:  No jugular venous distention, waveform within normal limits, carotid upstroke brisk and symmetric, no bruits, no thyromegaly LYMPHATICS:  No cervical, inguinal adenopathy LUNGS:  Clear to auscultation bilaterally BACK:  No CVA tenderness CHEST:  Unremarkable HEART:  PMI not displaced or sustained,S1 and S2 within normal limits, no S3, no S4, no clicks, no rubs, no murmurs ABD:  Flat, positive bowel sounds normal in frequency in pitch, no bruits, no rebound, no guarding, no midline pulsatile mass, no hepatomegaly, no splenomegaly EXT:  2 plus pulses throughout, no edema, no cyanosis no clubbing SKIN:  No rashes no nodules NEURO:  Cranial nerves II through XII grossly intact, motor grossly intact throughout PSYCH:  Cognitively intact, oriented to person place and time    EKG: Sinus rhythm, rate 61, axis within normal limits, intervals within normal limits, no acute ST-T wave changes.  12/22/2022    Recent Labs: 12/22/2022: BUN 12; Creatinine, Ser 0.87; Hemoglobin 14.6; Platelets 255; Potassium 3.5; Sodium 139    Lipid Panel    Component Value Date/Time   CHOL 187 07/04/2013 1604   TRIG 116 07/04/2013 1604   HDL 97 07/04/2013 1604   CHOLHDL 1.9 07/04/2013 1604   VLDL 23 07/04/2013 1604   LDLCALC 67 07/04/2013 1604      Wt Readings from Last 3 Encounters:  02/23/23 184 lb 9.6 oz (83.7 kg)  12/22/22 183 lb (83 kg)  11/08/22 181 lb 7 oz (82.3 kg)      Other studies Reviewed: Additional studies/ records that were reviewed today include: ED records. Review of the above records demonstrates:  Please see elsewhere in the note.     ASSESSMENT AND PLAN:  Precordial chest pain: Her chest discomfort has predominantly nonanginal characteristics.  She does not have significant cardiovascular risk factors.  She had no coronary calcium.  I  think the pretest probability of obstructive coronary disease is low and I am not suggesting further testing.  I think following with her GI evaluation is warranted.  Dyslipidemia: She did have an LDL of 123.  Her triglycerides were 323.  She was started on Lipitor earlier this year and has not had repeat labs and I will be happy to follow-up on these with a goal LDL I think of less than 100.  We talked about monitoring carbohydrates and diet suggestions for management of her triglycerides.   Current medicines are reviewed at length with the patient today.  The patient does not have  concerns regarding medicines.  The following changes have been made:  no change  Labs/ tests ordered today include:   Orders Placed This Encounter  Procedures   Lipid Profile     Disposition:   FU with me as needed.     Signed, Rollene Rotunda, MD  02/23/2023 3:29 PM    [facility]

## 2023-02-23 ENCOUNTER — Ambulatory Visit: Payer: Medicaid Other | Attending: Cardiology | Admitting: Cardiology

## 2023-02-23 ENCOUNTER — Encounter: Payer: Self-pay | Admitting: Cardiology

## 2023-02-23 VITALS — BP 118/78 | HR 84 | Ht 65.0 in | Wt 184.6 lb

## 2023-02-23 DIAGNOSIS — E785 Hyperlipidemia, unspecified: Secondary | ICD-10-CM

## 2023-02-23 DIAGNOSIS — R072 Precordial pain: Secondary | ICD-10-CM

## 2023-02-23 NOTE — Patient Instructions (Signed)
Lab Work: FASTING Lipid no appointment needed.  If you have labs (blood work) drawn today and your tests are completely normal, you will receive your results only by: MyChart Message (if you have MyChart) OR A paper copy in the mail If you have any lab test that is abnormal or we need to change your treatment, we will call you to review the results.    Follow-Up: At [facility], you and your health needs are our priority.  As part of our continuing mission to provide you with exceptional heart care, we have created designated Provider Care Teams.  These Care Teams include your primary Cardiologist (physician) and Advanced Practice Providers (APPs -  Physician Assistants and Nurse Practitioners) who all work together to provide you with the care you need, when you need it.  We recommend signing up for the patient portal called "MyChart".  Sign up information is provided on this After Visit Summary.  MyChart is used to connect with patients for Virtual Visits (Telemedicine).  Patients are able to view lab/test results, encounter notes, upcoming appointments, etc.  Non-urgent messages can be sent to your provider as well.   To learn more about what you can do with MyChart, go to [email or web address].    Your next appointment:    As needed.   Provider:   Rollene Rotunda, MD

## 2023-03-14 LAB — LIPID PANEL
Chol/HDL Ratio: 3.1 ratio (ref 0.0–4.4)
Cholesterol, Total: 142 mg/dL (ref 100–199)
HDL: 46 mg/dL (ref >39)
LDL Chol Calc (NIH): 69 mg/dL (ref 0–99)
Triglycerides: 155 mg/dL — ABNORMAL HIGH (ref 0–149)
VLDL Cholesterol Cal: 27 mg/dL (ref 5–40)

## 2023-03-19 ENCOUNTER — Other Ambulatory Visit (HOSPITAL_COMMUNITY): Payer: Medicaid Other

## 2023-04-17 ENCOUNTER — Other Ambulatory Visit (HOSPITAL_COMMUNITY)
Admission: RE | Admit: 2023-04-17 | Discharge: 2023-04-17 | Disposition: A | Discharge status: Home / Self Care | Payer: Self-pay | Source: Ambulatory Visit | Attending: Medical Genetics | Admitting: Medical Genetics

## 2023-04-17 DIAGNOSIS — Z006 Encounter for examination for normal comparison and control in clinical research program: Secondary | ICD-10-CM | POA: Insufficient documentation

## 2023-04-27 LAB — GENECONNECT MOLECULAR SCREEN: Genetic Analysis Overall Interpretation: NEGATIVE

## 2023-05-03 ENCOUNTER — Ambulatory Visit: Payer: Medicaid Other | Admitting: Obstetrics and Gynecology

## 2023-05-03 VITALS — BP 133/95 | HR 122 | Wt 180.5 lb

## 2023-05-03 DIAGNOSIS — Z30432 Encounter for removal of intrauterine contraceptive device: Secondary | ICD-10-CM | POA: Diagnosis not present

## 2023-05-03 NOTE — Progress Notes (Signed)
    GYNECOLOGY OFFICE PROCEDURE NOTE  Katie Pope is a 43 y.o. [id number] here for  IUD removal. No GYN concerns.  Last pap smear was on 6/24 and was normal.  IUD Removal  Patient identified, informed consent performed, consent signed.  Patient was in the dorsal lithotomy position, normal external genitalia was noted.  A speculum was placed in the patient's vagina, normal discharge was noted, no lesions. The cervix was visualized, no lesions, no abnormal discharge.  The strings of the IUD were grasped and pulled using ring forceps. The IUD was removed in its entirety. Patient tolerated the procedure well.    Patient will use nothing for contraception.  Routine preventative health maintenance measures emphasized.  BP noted to be elevated, pt advised to follow up with her PCP for potential future management.  Jerilynn Buddle, MD, FACOG Obstetrician & Gynecologist, [facility] for [facility], [facility]

## 2023-05-03 NOTE — Progress Notes (Signed)
 Pt. Presents for IUD removal. Pt. Has no other questions or concerns at this time.

## 2023-07-14 ENCOUNTER — Other Ambulatory Visit: Payer: Self-pay

## 2023-07-14 ENCOUNTER — Emergency Department (HOSPITAL_BASED_OUTPATIENT_CLINIC_OR_DEPARTMENT_OTHER)
Admission: EM | Admit: 2023-07-14 | Discharge: 2023-07-14 | Disposition: A | Discharge status: Home / Self Care | Attending: Emergency Medicine | Admitting: Emergency Medicine

## 2023-07-14 ENCOUNTER — Encounter (HOSPITAL_BASED_OUTPATIENT_CLINIC_OR_DEPARTMENT_OTHER): Payer: Self-pay | Admitting: Emergency Medicine

## 2023-07-14 ENCOUNTER — Emergency Department (HOSPITAL_BASED_OUTPATIENT_CLINIC_OR_DEPARTMENT_OTHER): Admitting: Radiology

## 2023-07-14 DIAGNOSIS — M940 Chondrocostal junction syndrome [Tietze]: Secondary | ICD-10-CM | POA: Diagnosis not present

## 2023-07-14 DIAGNOSIS — D72829 Elevated white blood cell count, unspecified: Secondary | ICD-10-CM | POA: Insufficient documentation

## 2023-07-14 DIAGNOSIS — J40 Bronchitis, not specified as acute or chronic: Secondary | ICD-10-CM | POA: Diagnosis not present

## 2023-07-14 DIAGNOSIS — R079 Chest pain, unspecified: Secondary | ICD-10-CM | POA: Diagnosis present

## 2023-07-14 LAB — CBC WITH DIFFERENTIAL/PLATELET
Abs Immature Granulocytes: 0.1 10*3/uL — ABNORMAL HIGH (ref 0.00–0.07)
Basophils Absolute: 0 10*3/uL (ref 0.0–0.1)
Basophils Relative: 0 %
Eosinophils Absolute: 0 10*3/uL (ref 0.0–0.5)
Eosinophils Relative: 0 %
HCT: 45.2 % (ref 36.0–46.0)
Hemoglobin: 15.7 g/dL — ABNORMAL HIGH (ref 12.0–15.0)
Immature Granulocytes: 1 %
Lymphocytes Relative: 31 %
Lymphs Abs: 5.7 10*3/uL — ABNORMAL HIGH (ref 0.7–4.0)
MCH: 29.2 pg (ref 26.0–34.0)
MCHC: 34.7 g/dL (ref 30.0–36.0)
MCV: 84 fL (ref 80.0–100.0)
Monocytes Absolute: 1 10*3/uL (ref 0.1–1.0)
Monocytes Relative: 5 %
Neutro Abs: 11.6 10*3/uL — ABNORMAL HIGH (ref 1.7–7.7)
Neutrophils Relative %: 63 %
Platelets: 348 10*3/uL (ref 150–400)
RBC: 5.38 MIL/uL — ABNORMAL HIGH (ref 3.87–5.11)
RDW: 13.1 % (ref 11.5–15.5)
WBC: 18.4 10*3/uL — ABNORMAL HIGH (ref 4.0–10.5)
nRBC: 0 % (ref 0.0–0.2)

## 2023-07-14 LAB — BASIC METABOLIC PANEL
Anion gap: 12 (ref 5–15)
BUN: 14 mg/dL (ref 6–20)
CO2: 22 mmol/L (ref 22–32)
Calcium: 10.3 mg/dL (ref 8.9–10.3)
Chloride: 103 mmol/L (ref 98–111)
Creatinine, Ser: 0.93 mg/dL (ref 0.44–1.00)
GFR, Estimated: >60 mL/min (ref >60)
Glucose, Bld: 119 mg/dL — ABNORMAL HIGH (ref 70–99)
Potassium: 3.4 mmol/L — ABNORMAL LOW (ref 3.5–5.1)
Sodium: 137 mmol/L (ref 135–145)

## 2023-07-14 LAB — RESP PANEL BY RT-PCR (RSV, FLU A&B, COVID)  RVPGX2
Influenza A by PCR: NEGATIVE
Influenza B by PCR: NEGATIVE
Resp Syncytial Virus by PCR: NEGATIVE
SARS Coronavirus 2 by RT PCR: NEGATIVE

## 2023-07-14 LAB — TROPONIN I (HIGH SENSITIVITY): Troponin I (High Sensitivity): 2 ng/L (ref <18)

## 2023-07-14 LAB — PREGNANCY, URINE: Preg Test, Ur: NEGATIVE

## 2023-07-14 MED ORDER — PREDNISONE 50 MG PO TABS
60.0000 mg | ORAL_TABLET | Freq: Once | ORAL | Status: AC
  Administered 2023-07-14: 60 mg via ORAL
  Filled 2023-07-14: qty 1

## 2023-07-14 MED ORDER — IPRATROPIUM-ALBUTEROL 0.5-2.5 (3) MG/3ML IN SOLN
3.0000 mL | Freq: Once | RESPIRATORY_TRACT | Status: AC
  Administered 2023-07-14: 3 mL via RESPIRATORY_TRACT
  Filled 2023-07-14: qty 3

## 2023-07-14 MED ORDER — KETOROLAC TROMETHAMINE 15 MG/ML IJ SOLN
15.0000 mg | Freq: Once | INTRAMUSCULAR | Status: AC
  Administered 2023-07-14: 15 mg via INTRAVENOUS
  Filled 2023-07-14: qty 1

## 2023-07-14 MED ORDER — LIDOCAINE 5 % EX PTCH
1.0000 | MEDICATED_PATCH | Freq: Once | CUTANEOUS | Status: DC
  Administered 2023-07-14: 1 via TRANSDERMAL
  Filled 2023-07-14: qty 1

## 2023-07-14 MED ORDER — ACETAMINOPHEN 500 MG PO TABS
1000.0000 mg | ORAL_TABLET | Freq: Once | ORAL | Status: AC
  Administered 2023-07-14: 1000 mg via ORAL
  Filled 2023-07-14: qty 2

## 2023-07-14 MED ORDER — LIDOCAINE 5 % EX PTCH: 1.0000 | MEDICATED_PATCH | CUTANEOUS | 0 refills | Status: DC

## 2023-07-14 MED ORDER — PREDNISONE 10 MG PO TABS: 40.0000 mg | ORAL_TABLET | Freq: Every day | ORAL | 0 refills | Status: AC

## 2023-07-14 MED ORDER — ALBUTEROL SULFATE HFA 108 (90 BASE) MCG/ACT IN AERS: 2.0000 | INHALATION_SPRAY | RESPIRATORY_TRACT | 0 refills | Status: DC | PRN

## 2023-07-14 NOTE — ED Notes (Signed)
 Dc instructions reviewed with patient. Patient voiced understanding. Dc with belongings.

## 2023-07-14 NOTE — ED Triage Notes (Signed)
 Pt c/o hoarseness x 1 week with dry cough and sinus drainage. Pt c/o "sharp" RT side CP radiating to back starting yesterday with shob, worsening today

## 2023-07-14 NOTE — ED Notes (Signed)
 Transport to [employer] after ambulating to [facility]

## 2023-07-14 NOTE — ED Notes (Signed)
 Pt hasn't had anything to eat today. Given orange sherbert as she has oral steroid ordered.

## 2023-07-14 NOTE — ED Notes (Addendum)
 Pt educated on the  mask spacer for her albuterol. Pt understood all the directions.

## 2023-07-14 NOTE — Discharge Instructions (Signed)
 You were seen in the emergency department for your chest pain and shortness of breath.  You did significant wheezing in the ER consistent with a bronchitis.  I have given you a short course of steroids you should complete this as prescribed as well as an albuterol inhaler you can continue to use as needed for shortness of breath and cough.  You likely have inflammation of the chest wall muscles from your viral infection and you can take Tylenol and Motrin every 6 hours as needed for pain as well as use lidocaine patches.  You should follow-up with your primary doctor in the next few days to have your symptoms rechecked.  You should return to the emergency department if you are having significantly worsening pain, severe shortness of breath or any other new or concerning symptoms.

## 2023-07-14 NOTE — ED Provider Notes (Signed)
 [place] EMERGENCY DEPARTMENT AT [facility] Provider Note   CSN: [id number] Arrival date & time: 07/14/23  1544     History  Chief Complaint  Patient presents with   Chest Pain    Katie Pope is a 43 y.o. female.  Patient is a 43 year old female with past medical history of GERD, bipolar disorder and tobacco use presenting to the emergency department with chest pain and shortness of breath.  Patient reports for the last week she has had cough and congestion.  She states that her cough has been a mix between a wet and dry cough.  She states she has had a few episodes of posttussive emesis.  She states for about the last 3 days she has had increasing shortness of breath and yesterday started to develop some right sided chest pain.  She states that it feels like a stabbing type of pain that wraps around to her back and is worse with taking deep breath or when she coughs.  She states that the pain has been constant since it started.  She denies any fevers, nausea or diarrhea.  She denies any lower extremity swelling.  She denies any known sick contacts.  She denies any recent hospitalizations or surgery, recent travel in the car or plane, hormone use or cancer history.  The history is provided by the patient.  Chest Pain      Home Medications Prior to Admission medications   Medication Sig Start Date End Date Taking? Authorizing Provider  albuterol (VENTOLIN HFA) 108 (90 Base) MCG/ACT inhaler Inhale 2 puffs into the lungs every 4 (four) hours as needed for wheezing or shortness of breath. 07/14/23  Yes Theresia Lo, Turkey K, DO  lidocaine (LIDODERM) 5 % Place 1 patch onto the skin daily. Remove & Discard patch within 12 hours or as directed by MD 07/14/23  Yes Theresia Lo, Cecile Sheerer, DO  predniSONE (DELTASONE) 10 MG tablet Take 4 tablets (40 mg total) by mouth daily for 4 days. 07/14/23 07/18/23 Yes Theresia Lo, Benetta Spar K, DO  atorvastatin (LIPITOR) 20 MG tablet Take 20 mg by  mouth at bedtime.    [provider]  busPIRone (BUSPAR) 15 MG tablet Take 15 mg by mouth 2 (two) times daily.    [provider]  clonazePAM (KLONOPIN) 0.5 MG tablet Take 0.5 mg by mouth 2 (two) times daily as needed for anxiety.    [provider]  cyproheptadine (PERIACTIN) 4 MG tablet Take 4 mg by mouth 2 (two) times daily. 07/21/21   [provider]  escitalopram (LEXAPRO) 5 MG tablet Take 5 mg by mouth daily. 07/21/21   [provider]  EYSUVIS 0.25 % SUSP Apply 1 drop to eye 2 (two) times daily. 03/18/21   [provider]  gabapentin (NEURONTIN) 100 MG capsule Take 100 mg by mouth 2 (two) times daily as needed. 07/23/21   [provider]  lamoTRIgine (LAMICTAL) 25 MG tablet Take 50 mg by mouth at bedtime. 07/21/21   [provider]  OLANZapine (ZYPREXA) 10 MG tablet Take 10 mg by mouth at bedtime. 07/21/21   [provider]  RESTASIS 0.05 % ophthalmic emulsion Place 1 drop into both eyes 2 (two) times daily. 03/18/21   [provider]      Allergies    Other, Rocephin [ceftriaxone sodium in dextrose], and Shellfish-derived products    Review of Systems   Review of Systems  Cardiovascular:  Positive for chest pain.    Physical Exam Updated Vital  Signs BP (!) 122/97   Pulse 99   Temp 98.9 F (37.2 C)   Resp 16   Wt 72.6 kg   LMP 06/30/2023   SpO2 100%   BMI 26.63 kg/m  Physical Exam Vitals and nursing note reviewed.  Constitutional:      General: She is not in acute distress.    Appearance: She is well-developed.  HENT:     Head: Normocephalic.  Eyes:     Extraocular Movements: Extraocular movements intact.  Cardiovascular:     Rate and Rhythm: Regular rhythm. Tachycardia present.     Heart sounds: Normal heart sounds.  Pulmonary:     Effort: Pulmonary effort is normal.     Breath sounds: Wheezing (Diffuse inspiratory and expiratory wheeze) present.  Chest:     Chest wall:  Tenderness (Right-sided chest wall) present.  Abdominal:     Palpations: Abdomen is soft.     Tenderness: There is no abdominal tenderness.  Musculoskeletal:        General: Normal range of motion.     Cervical back: Normal range of motion and neck supple.     Right lower leg: No edema.     Left lower leg: No edema.  Skin:    General: Skin is warm and dry.  Neurological:     General: No focal deficit present.     Mental Status: She is alert and oriented to person, place, and time.  Psychiatric:        Mood and Affect: Mood normal.        Behavior: Behavior normal.     ED Results / Procedures / Treatments   Labs (all labs ordered are listed, but only abnormal results are displayed) Labs Reviewed  BASIC METABOLIC PANEL - Abnormal; Notable for the following components:      Result Value   Potassium 3.4 (*)    Glucose, Bld 119 (*)    All other components within normal limits  CBC WITH DIFFERENTIAL/PLATELET - Abnormal; Notable for the following components:   WBC 18.4 (*)    RBC 5.38 (*)    Hemoglobin 15.7 (*)    Neutro Abs 11.6 (*)    Lymphs Abs 5.7 (*)    Abs Immature Granulocytes 0.10 (*)    All other components within normal limits  RESP PANEL BY RT-PCR (RSV, FLU A&B, COVID)  RVPGX2  PREGNANCY, URINE  TROPONIN I (HIGH SENSITIVITY)    EKG EKG Interpretation Date/Time:  Saturday July 14 2023 15:52:58 EDT Ventricular Rate:  122 PR Interval:  136 QRS Duration:  88 QT Interval:  322 QTC Calculation: 458 R Axis:   88  Text Interpretation: Sinus tachycardia Right atrial enlargement ST & T wave abnormality, consider inferior ischemia ST & T wave abnormality, consider anterolateral ischemia Abnormal ECG When compared with ECG of 22-Dec-2022 18:15, Vent. rate has increased BY  61 BPM ST now depressed in Inferior leads T wave inversion now evident in Inferior leads T wave inversion now evident in Anterior leads Confirmed by Elayne Snare ([id number]) on 07/14/2023 4:06:00  PM  Radiology DG Chest 2 View Result Date: 07/14/2023 CLINICAL DATA:  Cough short of breath EXAM: CHEST - 2 VIEW COMPARISON:  12/22/2022 FINDINGS: The heart size and mediastinal contours are within normal limits. Both lungs are clear. Mild scoliosis. IMPRESSION: No active cardiopulmonary disease. Electronically Signed   By: Jasmine Pang M.D.   On: 07/14/2023 16:43    Procedures Procedures    Medications Ordered in ED Medications  acetaminophen (TYLENOL) tablet 1,000 mg (has no administration in time range)  lidocaine (LIDODERM) 5 % 1-3 patch (has no administration in time range)  ipratropium-albuterol (DUONEB) 0.5-2.5 (3) MG/3ML nebulizer solution 3 mL (3 mLs Nebulization Given 07/14/23 1632)  predniSONE (DELTASONE) tablet 60 mg (60 mg Oral Given 07/14/23 1711)  ketorolac (TORADOL) 15 MG/ML injection 15 mg (15 mg Intravenous Given 07/14/23 1625)    ED Course/ Medical Decision Making/ A&P Clinical Course as of 07/14/23 1716  Sat Jul 14, 2023  1648 Labs with leukocytosis, otherwise within normal range. No acute disease on CXR. Viral swab is pending, meds in process. [VK]  1714 Upon reassessment, the patient's wheezing has completely resolved she reports improvement of her chest pain and shortness of breath.  Suspect likely viral syndrome that she is with outside the window of antiviral treatment.  Recommend continued symptomatic treatment for bronchitis and suspected costochondritis.  Patient is stable for discharge home with outpatient primary care follow-up and was given strict return precautions. [VK]    Clinical Course User Index [VK] Rexford Maus, DO                                 Medical Decision Making This patient presents to the ED with chief complaint(s) of chest pain, shortness of breath, cough with pertinent past medical history of GERD, tobacco use, bipolar disorder which further complicates the presenting complaint. The complaint involves an extensive differential  diagnosis and also carries with it a high risk of complications and morbidity.    The differential diagnosis includes patient has significant wheezing on exam concerning for bronchitis, pneumonia, pneumothorax, pulmonary edema, pleural effusion, ACS, arrhythmia, pericarditis, myocarditis, costochondritis, considering PE with her tachycardia but I think this is less likely with her significant wheezing to explain her symptoms on exam, viral syndrome  Additional history obtained: Additional history obtained from n/A Records reviewed Primary Care Documents and cardiology records  ED Course and Reassessment: On patient's arrival she is tachycardic but otherwise hemodynamically stable in no acute distress.  EKG on arrival showed sinus tachycardia with rate related ST changes.  The patient does have significant wheezing on exam.  She will be started on steroids and nebs and was given Toradol for pain control.  Will of labs including troponin and viral swab as well as chest x-ray performed and she will be closely reassessed.  Independent labs interpretation:  The following labs were independently interpreted: leukocytosis, otherwise within normal range  Independent visualization of imaging: - I independently visualized the following imaging with scope of interpretation limited to determining acute life threatening conditions related to emergency care: CXR, which revealed no acute disease  Consultation: - Consulted or discussed management/test interpretation w/ external professional: N/A  Consideration for admission or further workup: Patient has no emergent conditions requiring admission or further work-up at this time and is stable for discharge home with primary care follow-up  Social Determinants of health: N/A    Amount and/or Complexity of Data Reviewed Labs: ordered. Radiology: ordered.  Risk OTC drugs. Prescription drug management.          Final Clinical Impression(s) / ED  Diagnoses Final diagnoses:  Bronchitis  Costochondritis, acute    Rx / DC Orders ED Discharge Orders          Ordered    predniSONE (DELTASONE) 10 MG tablet  Daily        07/14/23 1715  albuterol (VENTOLIN HFA) 108 (90 Base) MCG/ACT inhaler  Every 4 hours PRN        07/14/23 1715    lidocaine (LIDODERM) 5 %  Every 24 hours        07/14/23 1715              Rexford Maus, DO 07/14/23 [id number]

## 2024-01-14 ENCOUNTER — Encounter (HOSPITAL_BASED_OUTPATIENT_CLINIC_OR_DEPARTMENT_OTHER): Payer: Self-pay | Admitting: *Deleted

## 2024-01-14 ENCOUNTER — Other Ambulatory Visit: Payer: Self-pay

## 2024-01-14 ENCOUNTER — Emergency Department (HOSPITAL_BASED_OUTPATIENT_CLINIC_OR_DEPARTMENT_OTHER)
Admission: EM | Admit: 2024-01-14 | Discharge: 2024-01-14 | Discharge status: Against Medical Advice | Attending: Emergency Medicine | Admitting: Emergency Medicine

## 2024-01-14 DIAGNOSIS — R300 Dysuria: Secondary | ICD-10-CM | POA: Insufficient documentation

## 2024-01-14 DIAGNOSIS — R5383 Other fatigue: Secondary | ICD-10-CM | POA: Diagnosis not present

## 2024-01-14 DIAGNOSIS — R1084 Generalized abdominal pain: Secondary | ICD-10-CM | POA: Diagnosis present

## 2024-01-14 DIAGNOSIS — R531 Weakness: Secondary | ICD-10-CM | POA: Insufficient documentation

## 2024-01-14 DIAGNOSIS — Z5321 Procedure and treatment not carried out due to patient leaving prior to being seen by health care provider: Secondary | ICD-10-CM | POA: Insufficient documentation

## 2024-01-14 LAB — URINALYSIS, ROUTINE W REFLEX MICROSCOPIC
Bilirubin Urine: NEGATIVE
Glucose, UA: 250 mg/dL — AB
Ketones, ur: 15 mg/dL — AB
Nitrite: POSITIVE — AB
Protein, ur: 30 mg/dL — AB
Specific Gravity, Urine: 1.017 (ref 1.005–1.030)
WBC, UA: >50 WBC/hpf (ref 0–5)
pH: 6 (ref 5.0–8.0)

## 2024-01-14 LAB — COMPREHENSIVE METABOLIC PANEL WITH GFR
ALT: 16 U/L (ref 0–44)
AST: 19 U/L (ref 15–41)
Albumin: 4.2 g/dL (ref 3.5–5.0)
Alkaline Phosphatase: 106 U/L (ref 38–126)
Anion gap: 15 (ref 5–15)
BUN: 7 mg/dL (ref 6–20)
CO2: 21 mmol/L — ABNORMAL LOW (ref 22–32)
Calcium: 9.9 mg/dL (ref 8.9–10.3)
Chloride: 97 mmol/L — ABNORMAL LOW (ref 98–111)
Creatinine, Ser: 0.94 mg/dL (ref 0.44–1.00)
GFR, Estimated: >60 mL/min
Glucose, Bld: 214 mg/dL — ABNORMAL HIGH (ref 70–99)
Potassium: 3.5 mmol/L (ref 3.5–5.1)
Sodium: 133 mmol/L — ABNORMAL LOW (ref 135–145)
Total Bilirubin: 0.6 mg/dL (ref 0.0–1.2)
Total Protein: 7.9 g/dL (ref 6.5–8.1)

## 2024-01-14 LAB — CBC
HCT: 43.2 % (ref 36.0–46.0)
Hemoglobin: 15 g/dL (ref 12.0–15.0)
MCH: 29.9 pg (ref 26.0–34.0)
MCHC: 34.7 g/dL (ref 30.0–36.0)
MCV: 86.2 fL (ref 80.0–100.0)
Platelets: 280 K/uL (ref 150–400)
RBC: 5.01 MIL/uL (ref 3.87–5.11)
RDW: 12.9 % (ref 11.5–15.5)
WBC: 9.6 K/uL (ref 4.0–10.5)
nRBC: 0 % (ref 0.0–0.2)

## 2024-01-14 LAB — RESP PANEL BY RT-PCR (RSV, FLU A&B, COVID)  RVPGX2
Influenza A by PCR: NEGATIVE
Influenza B by PCR: NEGATIVE
Resp Syncytial Virus by PCR: NEGATIVE
SARS Coronavirus 2 by RT PCR: NEGATIVE

## 2024-01-14 LAB — LIPASE, BLOOD: Lipase: 27 U/L (ref 11–51)

## 2024-01-14 LAB — PREGNANCY, URINE: Preg Test, Ur: NEGATIVE

## 2024-01-14 LAB — LACTIC ACID, PLASMA: Lactic Acid, Venous: 1.6 mmol/L (ref 0.5–1.9)

## 2024-01-14 MED ORDER — ACETAMINOPHEN 325 MG PO TABS
650.0000 mg | ORAL_TABLET | Freq: Once | ORAL | Status: AC | PRN
  Administered 2024-01-14: 650 mg via ORAL
  Filled 2024-01-14: qty 2

## 2024-01-14 NOTE — ED Notes (Signed)
1 set of cultures sent to lab  

## 2024-01-14 NOTE — ED Triage Notes (Signed)
 Pt to ED reporting increased weakness and fatigue with generalized abd pain and dysuria x 1 week.

## 2024-01-14 NOTE — ED Notes (Signed)
 Patient talking to nurse about frustrations on wait time. RN attempted to explain wait time and patient asking why she is being refused treatment. Rn reassuring patient multiple times that she is not being refused she is just having to wait for a room to see the doctor. Pt frustrated and after further conversation about the wait time left triage room willingly.

## 2024-01-16 ENCOUNTER — Ambulatory Visit (HOSPITAL_COMMUNITY)
Admission: EM | Admit: 2024-01-16 | Discharge: 2024-01-16 | Disposition: A | Discharge status: Home / Self Care | Attending: Physician Assistant | Admitting: Physician Assistant

## 2024-01-16 ENCOUNTER — Encounter (HOSPITAL_COMMUNITY): Payer: Self-pay | Admitting: Emergency Medicine

## 2024-01-16 ENCOUNTER — Telehealth (HOSPITAL_COMMUNITY): Payer: Self-pay

## 2024-01-16 ENCOUNTER — Other Ambulatory Visit (HOSPITAL_COMMUNITY): Payer: Self-pay

## 2024-01-16 DIAGNOSIS — N3000 Acute cystitis without hematuria: Secondary | ICD-10-CM

## 2024-01-16 LAB — POCT URINALYSIS DIP (MANUAL ENTRY)
Glucose, UA: NEGATIVE mg/dL
Nitrite, UA: NEGATIVE
Protein Ur, POC: 30 mg/dL — AB
Spec Grav, UA: 1.02 (ref 1.010–1.025)
Urobilinogen, UA: 4 U/dL — AB
pH, UA: 6 (ref 5.0–8.0)

## 2024-01-16 MED ORDER — CIPROFLOXACIN HCL 500 MG PO TABS: 500.0000 mg | ORAL_TABLET | Freq: Two times a day (BID) | ORAL | 0 refills | Status: DC

## 2024-01-16 MED ORDER — CIPROFLOXACIN HCL 500 MG PO TABS
500.0000 mg | ORAL_TABLET | Freq: Two times a day (BID) | ORAL | 0 refills | Status: DC
  Filled 2024-01-16 (×2): qty 10, 5d supply, fill #0

## 2024-01-16 NOTE — ED Triage Notes (Signed)
 Pt reports has dysuria for 1.5 week, chills. Had pain in ribs, midsection for 4-5 days. Was seen at ED 2 days ago and had fever, mychart showed UTI but left before finished being seen due to long wait time.  Pt adds that she had headaches as well. Hasn't taken any meds for the pain.

## 2024-01-16 NOTE — Discharge Instructions (Signed)
 Take medication as prescribed Side effects of medication discussed - discontinue medication if you experience leg pain

## 2024-01-16 NOTE — Telephone Encounter (Signed)
 Patient requested her med be sent [facility] on [address].

## 2024-01-16 NOTE — ED Provider Notes (Signed)
 [facility]    CSN: [id number] Arrival date & time: 01/16/24  1121      History   Chief Complaint Chief Complaint  Patient presents with   Dysuria    HPI Katie Pope is a 43 y.o. female.   Patient here for evaluation of UTI x 3 weeks.  Wentt o ED 2 nights ago, left after triage due to wait time.  ED performed C&S, prelim results available, positive for e. Coli.  Patient reports chills, flank pain.    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    Past Surgical History:  Procedure Laterality Date   CARPAL TUNNEL RELEASE Right 11/08/2022   Procedure: EXTENSILE CARPAL TUNNEL RELEASE;  Surgeon: Romona Harari, MD;  Location: [facility];  Service: Orthopedics;  Laterality: Right;  regional 60   FACIAL FRACTURE SURGERY Left    TENOSYNOVECTOMY Right 11/08/2022   Procedure: POSSIBLE TENOSYNOVECTOMY;  Surgeon: Romona Harari, MD;  Location: [facility];  Service: Orthopedics;  Laterality: Right;    OB History     Gravida  3   Para  2   Term  1   Preterm  1   AB      Living  2      SAB      IAB      Ectopic      Multiple      Live Births  2            Home Medications    Prior to Admission medications   Medication Sig Start Date End Date Taking? Authorizing Provider  albuterol  (VENTOLIN  HFA) 108 (90 Base) MCG/ACT inhaler Inhale 2 puffs into the lungs every 4 (four) hours as needed for wheezing or shortness of breath. 07/14/23   Kingsley, Victoria K, DO  atorvastatin (LIPITOR) 20 MG tablet Take 20 mg by mouth at bedtime.    [provider]  busPIRone (BUSPAR) 15 MG tablet Take 15 mg by mouth 2 (two) times daily.    [provider]  ciprofloxacin  (CIPRO ) 500 MG tablet Take 1 tablet  (500 mg total) by mouth every 12 (twelve) hours. 01/16/24   Juleen Rush, PA-C  clonazePAM (KLONOPIN) 0.5 MG tablet Take 0.5 mg by mouth 2 (two) times daily as needed for anxiety.    [provider]  cyproheptadine (PERIACTIN) 4 MG tablet Take 4 mg by mouth 2 (two) times daily. 07/21/21   [provider]  escitalopram (LEXAPRO) 5 MG tablet Take 5 mg by mouth daily. 07/21/21   [provider]  EYSUVIS 0.25 % SUSP Apply 1 drop to eye 2 (two) times daily. 03/18/21   [provider]  gabapentin (NEURONTIN) 100 MG capsule Take 100 mg by mouth 2 (two) times daily as needed. 07/23/21   [provider]  lamoTRIgine (LAMICTAL) 25 MG tablet Take 50 mg by mouth at bedtime. 07/21/21   [provider]  lidocaine  (LIDODERM ) 5 % Place 1 patch onto the skin daily. Remove & Discard patch within 12 hours or as directed by MD 07/14/23   Kingsley, Victoria K, DO  OLANZapine (ZYPREXA) 10 MG tablet Take 10 mg by mouth at bedtime. 07/21/21   [provider]  RESTASIS 0.05 % ophthalmic emulsion Place 1 drop into both eyes 2 (two) times daily. 03/18/21   [provider]    Family History Family History  Problem Relation Age of Onset   Cancer Paternal Grandfather    Dementia Paternal Grandmother    Diabetes Mother     Social History Social History   Tobacco Use   Smoking status: Some Days    Current packs/day: 0.00    Types: Cigarettes    Last attempt to quit: 12/23/2011    Years since quitting: 12.0   Smokeless tobacco: Former  Building services engineer status: Never Used  Substance Use Topics   Alcohol use: Not Currently    Comment: occ   Drug use: Yes    Types: Marijuana    Comment: last use 10/30/2022     Allergies   Other, Rocephin [ceftriaxone sodium in dextrose ], and Shellfish-derived products   Review of Systems Review of Systems  Constitutional:  Negative for chills, fatigue and fever.  Gastrointestinal:  Negative for  abdominal pain, nausea and vomiting.  Genitourinary:  Positive for dysuria, flank pain, frequency and urgency. Negative for difficulty urinating, genital sores, hematuria, vaginal bleeding, vaginal discharge and vaginal pain.  Musculoskeletal:  Negative for back pain.  Skin:  Negative for rash and wound.  Allergic/Immunologic: Negative for environmental allergies and immunocompromised state.  Neurological:  Negative for headaches.  Hematological:  Negative for adenopathy. Does not bruise/bleed easily.  Psychiatric/Behavioral:  Negative for sleep disturbance.      Physical Exam Triage Vital Signs ED Triage Vitals [01/16/24 1241]  Encounter Vitals Group     BP 109/88     Girls Systolic BP Percentile      Girls Diastolic BP Percentile      Boys Systolic BP Percentile      Boys Diastolic BP Percentile      Pulse Rate (!) 106     Resp 17     Temp 98.2 F (36.8 C)     Temp Source Oral     SpO2 97 %     Weight      Height      Head Circumference      Peak Flow      Pain Score 6     Pain Loc      Pain Education      Exclude from Growth Chart    No data found.  Updated Vital Signs BP 109/88 (BP Location: Right Arm)   Pulse (!) 106   Temp 98.2 F (36.8 C) (Oral)   Resp 17   LMP 01/14/2024 (Exact Date)   SpO2 97%   Visual Acuity Right Eye Distance:   Left Eye Distance:   Bilateral Distance:    Right Eye Near:   Left Eye Near:    Bilateral Near:     Physical Exam Vitals and nursing note reviewed.  Constitutional:      General: She is not in acute distress.    Appearance: Normal appearance. She is well-developed. She is not ill-appearing.  HENT:     Head: Normocephalic and atraumatic.     Nose: Nose normal.  Eyes:     General: No scleral icterus.    Conjunctiva/sclera: Conjunctivae normal.     Pupils: Pupils are equal, round, and reactive to light.  Cardiovascular:     Rate and Rhythm: Normal rate and regular rhythm.     Pulses: Normal pulses.     Heart  sounds: Normal  heart sounds. No murmur heard. Pulmonary:     Effort: Pulmonary effort is normal. No respiratory distress.     Breath sounds: Normal breath sounds.  Abdominal:     General: Bowel sounds are normal.     Palpations: Abdomen is soft.     Tenderness: There is no abdominal tenderness. There is right CVA tenderness. There is no left CVA tenderness, guarding or rebound. Negative signs include Murphy's sign, Rovsing's sign, McBurney's sign, psoas sign and obturator sign.  Musculoskeletal:     Cervical back: Neck supple.  Skin:    General: Skin is warm and dry.  Neurological:     General: No focal deficit present.     Mental Status: She is alert and oriented to person, place, and time.  Psychiatric:        Mood and Affect: Mood normal.        Behavior: Behavior normal.      UC Treatments / Results  Labs (all labs ordered are listed, but only abnormal results are displayed) Labs Reviewed  POCT URINALYSIS DIP (MANUAL ENTRY) - Abnormal; Notable for the following components:      Result Value   Clarity, UA cloudy (*)    Bilirubin, UA small (*)    Ketones, POC UA trace (5) (*)    Blood, UA large (*)    Protein Ur, POC =30 (*)    Urobilinogen, UA 4.0 (*)    Leukocytes, UA Small (1+) (*)    All other components within normal limits    EKG   Radiology No results found.  Procedures Procedures (including critical care time)  Medications Ordered in UC Medications - No data to display  Initial Impression / Assessment and Plan / UC Course  I have reviewed the triage vital signs and the nursing notes.  Pertinent labs & imaging results that were available during my care of the patient were reviewed by me and considered in my medical decision making (see chart for details).     Prelim positive for UTI Concerned with possible early pyelonephritis, as she is mildly tachy and has some R sided Cva tenderness. will start cipro  S/e of medication discussed, including risk of  tendon rupture, discontinue if you experience leg pain Go to ED if sx worsen Final Clinical Impressions(s) / UC Diagnoses   Final diagnoses:  Acute cystitis without hematuria     Discharge Instructions      Take medication as prescribed Side effects of medication discussed - discontinue medication if you experience leg pain    ED Prescriptions     Medication Sig Dispense Auth. Provider   ciprofloxacin  (CIPRO ) 500 MG tablet Take 1 tablet (500 mg total) by mouth every 12 (twelve) hours. 10 tablet Juleen Rush, PA-C      PDMP not reviewed this encounter.   Juleen Rush, PA-C 01/16/24 1327

## 2024-01-17 ENCOUNTER — Emergency Department (HOSPITAL_COMMUNITY)
Admission: EM | Admit: 2024-01-17 | Discharge: 2024-01-18 | Disposition: A | Discharge status: Other Acute Inpatient Hospital | Attending: Emergency Medicine | Admitting: Emergency Medicine

## 2024-01-17 ENCOUNTER — Emergency Department (HOSPITAL_COMMUNITY)

## 2024-01-17 ENCOUNTER — Telehealth (HOSPITAL_COMMUNITY): Payer: Self-pay

## 2024-01-17 ENCOUNTER — Encounter (HOSPITAL_COMMUNITY): Payer: Self-pay

## 2024-01-17 ENCOUNTER — Other Ambulatory Visit: Payer: Self-pay

## 2024-01-17 DIAGNOSIS — F17210 Nicotine dependence, cigarettes, uncomplicated: Secondary | ICD-10-CM | POA: Insufficient documentation

## 2024-01-17 DIAGNOSIS — F332 Major depressive disorder, recurrent severe without psychotic features: Secondary | ICD-10-CM | POA: Diagnosis present

## 2024-01-17 DIAGNOSIS — R519 Headache, unspecified: Secondary | ICD-10-CM | POA: Insufficient documentation

## 2024-01-17 DIAGNOSIS — R45851 Suicidal ideations: Secondary | ICD-10-CM | POA: Diagnosis not present

## 2024-01-17 DIAGNOSIS — F1420 Cocaine dependence, uncomplicated: Secondary | ICD-10-CM | POA: Insufficient documentation

## 2024-01-17 DIAGNOSIS — M542 Cervicalgia: Secondary | ICD-10-CM | POA: Insufficient documentation

## 2024-01-17 DIAGNOSIS — J45909 Unspecified asthma, uncomplicated: Secondary | ICD-10-CM | POA: Diagnosis not present

## 2024-01-17 DIAGNOSIS — F1220 Cannabis dependence, uncomplicated: Secondary | ICD-10-CM | POA: Diagnosis not present

## 2024-01-17 DIAGNOSIS — R4182 Altered mental status, unspecified: Secondary | ICD-10-CM | POA: Insufficient documentation

## 2024-01-17 LAB — COMPREHENSIVE METABOLIC PANEL WITH GFR
ALT: 13 U/L (ref 0–44)
AST: 15 U/L (ref 15–41)
Albumin: 3.2 g/dL — ABNORMAL LOW (ref 3.5–5.0)
Alkaline Phosphatase: 71 U/L (ref 38–126)
Anion gap: 13 (ref 5–15)
BUN: 9 mg/dL (ref 6–20)
CO2: 24 mmol/L (ref 22–32)
Calcium: 9.2 mg/dL (ref 8.9–10.3)
Chloride: 104 mmol/L (ref 98–111)
Creatinine, Ser: 0.89 mg/dL (ref 0.44–1.00)
GFR, Estimated: >60 mL/min (ref >60)
Glucose, Bld: 86 mg/dL (ref 70–99)
Potassium: 4 mmol/L (ref 3.5–5.1)
Sodium: 141 mmol/L (ref 135–145)
Total Bilirubin: 0.3 mg/dL (ref 0.0–1.2)
Total Protein: 6.9 g/dL (ref 6.5–8.1)

## 2024-01-17 LAB — RAPID URINE DRUG SCREEN, HOSP PERFORMED
Amphetamines: NOT DETECTED
Barbiturates: NOT DETECTED
Benzodiazepines: NOT DETECTED
Cocaine: POSITIVE — AB
Opiates: NOT DETECTED
Tetrahydrocannabinol: POSITIVE — AB

## 2024-01-17 LAB — CBG MONITORING, ED: Glucose-Capillary: 86 mg/dL (ref 70–99)

## 2024-01-17 LAB — URINE CULTURE: Culture: >=100000 — AB

## 2024-01-17 LAB — AMMONIA: Ammonia: 36 umol/L — ABNORMAL HIGH (ref 9–35)

## 2024-01-17 LAB — ETHANOL: Alcohol, Ethyl (B): <15 mg/dL (ref <15)

## 2024-01-17 LAB — HCG, SERUM, QUALITATIVE: Preg, Serum: NEGATIVE

## 2024-01-17 NOTE — ED Triage Notes (Signed)
 Pt presents to ED from home reporting feeling floppy. Visitor with patient reports she was recently prescribed abx for UTI, has taken 3 doses and began acting strangely. Pt is thrashing around in wheelchair during registration, moaning.

## 2024-01-17 NOTE — Telephone Encounter (Signed)
 Katie Pope called the clinic stating she thought she was having a reaction to the Cipro  she was prescribed. She stated that she woke up with a headache and feeling numbness in her left hand and just feeling bad all over. The patient was hysterically crying throughout the entire phone call it was hard for me to understand her. She stated she was feeling really sad and couldn't shake it. This RN asked if she was alone, she stated yes that she was in her car at [employer]. This RN advised her to be seen right away for her symptoms that she could come to Urgent Care or go to the Emergency Department. Patient then began crying even louder. This RN advised patient that if she felt like she couldn't drive she should call [phone]. This RN tried to calm patient over the phone and talk with her. Patient ended up saying okay and hung up.

## 2024-01-17 NOTE — ED Provider Triage Note (Signed)
 Emergency Medicine Provider Triage Evaluation Note  Katie Pope , a 43 y.o. female  was evaluated in triage.  States that her body is floppy.  She was started on Cipro  after urinary tract infection.  Denies headache or vision changes.  Review of Systems  Positive: ams Negative:   Physical Exam  BP (!) 153/104   Pulse (!) 111   Temp 98.9 F (37.2 C)   Resp 20   Ht 5' 5 (1.651 m)   Wt 72.6 kg   LMP 01/14/2024 (Exact Date)   SpO2 99%   BMI 26.63 kg/m  Gen:   Awake, no distress   Resp:  Normal effort  MSK:   Moves extremities without difficulty  Other:  Patient ambulates to the desk.  She appears clinically intoxicated.  She is ambulatory without assistance.  Medical Decision Making  Medically screening exam initiated at 6:47 PM.  Appropriate orders placed.  Katie Pope was informed that the remainder of the evaluation will be completed by another provider, this initial triage assessment does not replace that evaluation, and the importance of remaining in the ED until their evaluation is complete.  Patient here with floppy body.  Started after taking Cipro .  Intermittent confusion.  Altered mental status assessment started   Arloa Chroman, PA-C 01/17/24 1850

## 2024-01-17 NOTE — ED Triage Notes (Signed)
 Patient appears floppy.  Reports she can't hold her head up and it all started after taking cipro  for a UTI prescribed yesterday.  Patient can't sit still.  Mother report she thinks this is all psych but unsure.

## 2024-01-18 ENCOUNTER — Telehealth (HOSPITAL_BASED_OUTPATIENT_CLINIC_OR_DEPARTMENT_OTHER): Payer: Self-pay | Admitting: *Deleted

## 2024-01-18 ENCOUNTER — Encounter (HOSPITAL_COMMUNITY): Payer: Self-pay | Admitting: Psychiatry

## 2024-01-18 ENCOUNTER — Inpatient Hospital Stay (HOSPITAL_COMMUNITY)
Admission: AD | Admit: 2024-01-18 | Discharge: 2024-01-23 | DRG: 885 | Disposition: A | Discharge status: Home / Self Care | Source: Intra-hospital

## 2024-01-18 DIAGNOSIS — R45851 Suicidal ideations: Secondary | ICD-10-CM | POA: Diagnosis present

## 2024-01-18 DIAGNOSIS — F17210 Nicotine dependence, cigarettes, uncomplicated: Secondary | ICD-10-CM | POA: Diagnosis present

## 2024-01-18 DIAGNOSIS — F1290 Cannabis use, unspecified, uncomplicated: Secondary | ICD-10-CM | POA: Diagnosis present

## 2024-01-18 DIAGNOSIS — N390 Urinary tract infection, site not specified: Secondary | ICD-10-CM | POA: Diagnosis present

## 2024-01-18 DIAGNOSIS — Z888 Allergy status to other drugs, medicaments and biological substances status: Secondary | ICD-10-CM

## 2024-01-18 DIAGNOSIS — Z79899 Other long term (current) drug therapy: Secondary | ICD-10-CM

## 2024-01-18 DIAGNOSIS — Z91013 Allergy to seafood: Secondary | ICD-10-CM | POA: Diagnosis not present

## 2024-01-18 DIAGNOSIS — E7800 Pure hypercholesterolemia, unspecified: Secondary | ICD-10-CM | POA: Diagnosis present

## 2024-01-18 DIAGNOSIS — F314 Bipolar disorder, current episode depressed, severe, without psychotic features: Principal | ICD-10-CM | POA: Diagnosis present

## 2024-01-18 DIAGNOSIS — Z833 Family history of diabetes mellitus: Secondary | ICD-10-CM

## 2024-01-18 DIAGNOSIS — Z5900 Homelessness unspecified: Secondary | ICD-10-CM

## 2024-01-18 DIAGNOSIS — F1490 Cocaine use, unspecified, uncomplicated: Secondary | ICD-10-CM | POA: Diagnosis present

## 2024-01-18 DIAGNOSIS — K219 Gastro-esophageal reflux disease without esophagitis: Secondary | ICD-10-CM | POA: Diagnosis present

## 2024-01-18 DIAGNOSIS — Z59869 Financial insecurity, unspecified: Secondary | ICD-10-CM

## 2024-01-18 DIAGNOSIS — F332 Major depressive disorder, recurrent severe without psychotic features: Principal | ICD-10-CM | POA: Diagnosis present

## 2024-01-18 DIAGNOSIS — J45909 Unspecified asthma, uncomplicated: Secondary | ICD-10-CM | POA: Diagnosis present

## 2024-01-18 DIAGNOSIS — M419 Scoliosis, unspecified: Secondary | ICD-10-CM | POA: Diagnosis present

## 2024-01-18 DIAGNOSIS — E785 Hyperlipidemia, unspecified: Secondary | ICD-10-CM | POA: Diagnosis present

## 2024-01-18 DIAGNOSIS — F411 Generalized anxiety disorder: Secondary | ICD-10-CM | POA: Diagnosis present

## 2024-01-18 HISTORY — DX: Gastro-esophageal reflux disease without esophagitis: K21.9

## 2024-01-18 LAB — CBC
HCT: 40.2 % (ref 36.0–46.0)
Hemoglobin: 13.4 g/dL (ref 12.0–15.0)
MCH: 29.5 pg (ref 26.0–34.0)
MCHC: 33.3 g/dL (ref 30.0–36.0)
MCV: 88.5 fL (ref 80.0–100.0)
Platelets: 318 K/uL (ref 150–400)
RBC: 4.54 MIL/uL (ref 3.87–5.11)
RDW: 12.9 % (ref 11.5–15.5)
WBC: 8.1 K/uL (ref 4.0–10.5)
nRBC: 0 % (ref 0.0–0.2)

## 2024-01-18 MED ORDER — DIPHENHYDRAMINE HCL 50 MG/ML IJ SOLN: 50.0000 mg | Freq: Three times a day (TID) | INTRAMUSCULAR | Status: DC | PRN

## 2024-01-18 MED ORDER — NICOTINE 21 MG/24HR TD PT24
21.0000 mg | MEDICATED_PATCH | Freq: Every day | TRANSDERMAL | Status: DC
Start: 2024-01-19 — End: 2024-01-23
  Administered 2024-01-18: 21 mg via TRANSDERMAL
  Filled 2024-01-18: qty 1

## 2024-01-18 MED ORDER — HALOPERIDOL LACTATE 5 MG/ML IJ SOLN: 5.0000 mg | Freq: Three times a day (TID) | INTRAMUSCULAR | Status: DC | PRN

## 2024-01-18 MED ORDER — MAGNESIUM HYDROXIDE 400 MG/5ML PO SUSP: 30.0000 mL | Freq: Every day | ORAL | Status: DC | PRN

## 2024-01-18 MED ORDER — HALOPERIDOL LACTATE 5 MG/ML IJ SOLN: 10.0000 mg | Freq: Three times a day (TID) | INTRAMUSCULAR | Status: DC | PRN

## 2024-01-18 MED ORDER — ALUM & MAG HYDROXIDE-SIMETH 200-200-20 MG/5ML PO SUSP: 30.0000 mL | ORAL | Status: DC | PRN

## 2024-01-18 MED ORDER — LORAZEPAM 2 MG/ML IJ SOLN: 2.0000 mg | Freq: Three times a day (TID) | INTRAMUSCULAR | Status: DC | PRN

## 2024-01-18 MED ORDER — DIPHENHYDRAMINE HCL 25 MG PO CAPS: 50.0000 mg | ORAL_CAPSULE | Freq: Three times a day (TID) | ORAL | Status: DC | PRN

## 2024-01-18 MED ORDER — HYDROXYZINE HCL 25 MG PO TABS
25.0000 mg | ORAL_TABLET | Freq: Three times a day (TID) | ORAL | Status: DC | PRN
  Administered 2024-01-18 – 2024-01-22 (×12): 25 mg via ORAL
  Filled 2024-01-18 (×12): qty 1

## 2024-01-18 MED ORDER — HALOPERIDOL 5 MG PO TABS: 5.0000 mg | ORAL_TABLET | Freq: Three times a day (TID) | ORAL | Status: DC | PRN

## 2024-01-18 MED ORDER — ACETAMINOPHEN 325 MG PO TABS
650.0000 mg | ORAL_TABLET | Freq: Four times a day (QID) | ORAL | Status: DC | PRN
  Administered 2024-01-20 – 2024-01-21 (×3): 650 mg via ORAL
  Filled 2024-01-18 (×3): qty 2

## 2024-01-18 MED ORDER — PNEUMOCOCCAL 20-VAL CONJ VACC 0.5 ML IM SUSY
0.5000 mL | PREFILLED_SYRINGE | INTRAMUSCULAR | Status: DC
  Filled 2024-01-18: qty 0.5

## 2024-01-18 NOTE — Tx Team (Signed)
 Initial Treatment Plan 01/18/2024 4:08 PM Katie Pope [id number]    PATIENT STRESSORS: Financial difficulties   Substance abuse     PATIENT STRENGTHS: Capable of independent living  General fund of knowledge    PATIENT IDENTIFIED PROBLEMS: MDD recurrent severe without psychosis                      DISCHARGE CRITERIA:  Adequate post-discharge living arrangements Motivation to continue treatment in a less acute level of care Need for constant or close observation no longer present Verbal commitment to aftercare and medication compliance  PRELIMINARY DISCHARGE PLAN: Attend 12-step recovery group Outpatient therapy Placement in alternative living arrangements  PATIENT/FAMILY INVOLVEMENT: This treatment plan has been presented to and reviewed with the patient, Katie Pope.  The patient and family have been given the opportunity to ask questions and make suggestions.  Ivania Teagarden M Jacklynn Dehaas, RN 01/18/2024, 4:08 PM

## 2024-01-18 NOTE — Progress Notes (Signed)
 Pt has been accepted to [facility] on 01/18/2024 Bed assignment: 407-01  Pt meets inpatient criteria per: Richerd Ivans NP  Attending Physician will be: Dr. Raliegh   Report can be called to: unit: Adult unit: [phone]  Pt can arrive after 1 PM  Care Team Notified: [facility]  [facility] McNichol RN, Jerel Gravely NP, Nena Coffee RN   [place] Ferron Ishmael LCSW-A   01/18/2024 10:38 AM

## 2024-01-18 NOTE — Group Note (Deleted)
 Date:  01/18/2024 Time:  10:40 PM  Group Topic/Focus:  Emotional Education:   The focus of this group is to discuss what feelings/emotions are, and how they are experienced.     Participation Level:  {BHH PARTICIPATION OZCZO:77735}  Participation Quality:  {BHH PARTICIPATION QUALITY:22265}  Affect:  {BHH AFFECT:22266}  Cognitive:  {BHH COGNITIVE:22267}  Insight: {BHH Insight2:20797}  Engagement in Group:  {BHH ENGAGEMENT IN HMNLE:77731}  Modes of Intervention:  {BHH MODES OF INTERVENTION:22269}  Additional Comments:  ***  Weiland Tomich L 01/18/2024, 10:40 PM

## 2024-01-18 NOTE — ED Provider Notes (Signed)
 [facility] Emergency Department Provider Note MRN:  [id number]  Arrival date & time: 01/18/24     Chief Complaint   Altered Mental Status   History of Present Illness   Katie Pope is a 43 y.o. year-old female with a history of bipolar disorder presenting to the ED with chief complaint of floppy.  Feeling floppy and weak and low energy all day today.  Noticed the symptoms upon awakening at 7 AM.  Went to bed at about 2 AM.  Recently started taking ciprofloxacin .  Having some mild right-sided neck pain, mild headache, denies numbness or weakness to the arms or legs.  Review of Systems  A thorough review of systems was obtained and all systems are negative except as noted in the HPI and PMH.   Patient's Health History    Past Medical History:  Diagnosis Date   Anxiety    Asthma    Bipolar 1 disorder (HCC)    Bronchitis    Depression    Dyslipidemia    Scoliosis     Past Surgical History:  Procedure Laterality Date   CARPAL TUNNEL RELEASE Right 11/08/2022   Procedure: EXTENSILE CARPAL TUNNEL RELEASE;  Surgeon: Romona Harari, MD;  Location: [facility];  Service: Orthopedics;  Laterality: Right;  regional 60   FACIAL FRACTURE SURGERY Left    TENOSYNOVECTOMY Right 11/08/2022   Procedure: POSSIBLE TENOSYNOVECTOMY;  Surgeon: Romona Harari, MD;  Location: [facility];  Service: Orthopedics;  Laterality: Right;    Family History  Problem Relation Age of Onset   Cancer Paternal Grandfather    Dementia Paternal Grandmother    Diabetes Mother     Social History   Socioeconomic History   Marital status: Single    Spouse name: Not on file   Number of children: Not on file   Years of education: Not on file   Highest education level: Not on file  Occupational History   Not on file  Tobacco Use   Smoking status: Some Days    Current packs/day: 0.00    Types: Cigarettes    Last attempt to quit: 12/23/2011     Years since quitting: 12.0   Smokeless tobacco: Former  Building services engineer status: Never Used  Substance and Sexual Activity   Alcohol use: Not Currently    Comment: occ   Drug use: Yes    Types: Marijuana    Comment: last use 10/30/2022   Sexual activity: Not Currently    Birth control/protection: I.U.D.  Other Topics Concern   Not on file  Social History Narrative   Lives with 37 year old daughter and 63 year old son.     Social Drivers of Health   Financial Resource Strain: Not at Risk (06/15/2022)   Received from [employer] [facility]    Financial Resource Strain: 1  Food Insecurity: At Risk (08/14/2023)   Received from [facility] Insecurity    Within the past 12 months, you worried that your food would run out before you got money to buy more.: 2  Transportation Needs: Not at Risk (08/14/2023)   Received from [facility] Needs    In the past 12 months, has lack of transportation kept you from medical appointments, meetings, work or from getting things needed for daily living? (Check all that apply): 1  Physical Activity: Not on File (05/26/2022)   Received from [facility]   Physical Activity  Physical Activity: 0  Stress: Not on File (05/26/2022)   Received from [facility]   Stress    Stress: 0  Social Connections: Not on File (12/26/2022)   Received from [facility]   Social Connections    Connectedness: 0  Intimate Partner Violence: Not on file     Physical Exam   Vitals:   01/17/24 2227 01/17/24 2316  BP: (!) 116/94 (!) 111/58  Pulse: 78 72  Resp: 18 17  Temp: 98.2 F (36.8 C) 98.2 F (36.8 C)  SpO2: 98% 100%    CONSTITUTIONAL: Chronically ill-appearing, NAD NEURO/PSYCH:  Alert and oriented x 3, normal and symmetric strength and sensation, normal coordination, normal speech, some embellishment of upper extremity strength exam. EYES:  eyes equal and reactive ENT/NECK:  no LAD, no JVD CARDIO: Regular rate, well-perfused, normal S1 and S2 PULM:   CTAB no wheezing or rhonchi GI/GU:  non-distended, non-tender MSK/SPINE:  No gross deformities, no edema SKIN:  no rash, atraumatic   *Additional and/or pertinent findings included in MDM below  Diagnostic and Interventional Summary    EKG Interpretation Date/Time:    Ventricular Rate:    PR Interval:    QRS Duration:    QT Interval:    QTC Calculation:   R Axis:      Text Interpretation:         Labs Reviewed  COMPREHENSIVE METABOLIC PANEL WITH GFR - Abnormal; Notable for the following components:      Result Value   Albumin 3.2 (*)    All other components within normal limits  AMMONIA - Abnormal; Notable for the following components:   Ammonia 36 (*)    All other components within normal limits  RAPID URINE DRUG SCREEN, HOSP PERFORMED - Abnormal; Notable for the following components:   Cocaine POSITIVE (*)    Tetrahydrocannabinol POSITIVE (*)    All other components within normal limits  ETHANOL  HCG, SERUM, QUALITATIVE  CBG MONITORING, ED    CT HEAD WO CONTRAST  Final Result      Medications - No data to display   Procedures  /  Critical Care Procedures  ED Course and Medical Decision Making  Initial Impression and Ddx Patient seems generally mildly somnolent on my initial exam with eyes closed, talking in full sentences, has a inconsistent neurological exam, poor effort but no obvious focal abnormalities.  Differential diagnosis includes electrolyte disturbance, UTI, side effect of medication, polypharmacy.  Patient says that she is not taking any of her psychiatric medications.  She also informs me that she posted several threats on her own life on Facebook last night and showed me the posts on her phone.  When I told her that this was concerning and she would need to talk to the mental health experts she became mildly agitated, proceeded to leave the room, paced the hallway, exhibiting normal gait, normal strength to all extremities.  UDS positive for cocaine  and THC recently which may be contributing to patient's symptoms.  Past medical/surgical history that increases complexity of ED encounter: Bipolar disorder  Interpretation of Diagnostics I personally reviewed the Laboratory Testing and my interpretation is as follows: No significant blood count or electrolyte disturbance.  CT head with no signs of intracranial bleeding or mass  Patient Reassessment and Ultimate Disposition/Management     Given the reassuring workup doubt medical emergency, doubt stroke.  IVC initiated because patient threatening to leave, medically cleared, awaiting TTS recommendations.  Patient management required discussion with the following services  or consulting groups:  Psychiatry/TTS  Complexity of Problems Addressed Acute illness or injury that poses threat of life of bodily function  Additional Data Reviewed and Analyzed Further history obtained from: Further history from spouse/family member  Additional Factors Impacting ED Encounter Risk Consideration of hospitalization  Ozell HERO. Theadore, MD [facility] Emergency Medicine [facility] [email or web address]  Final Clinical Impressions(s) / ED Diagnoses     ICD-10-CM   1. Suicidal ideation  R45.851       ED Discharge Orders     None        Discharge Instructions Discussed with and Provided to Patient:   Discharge Instructions   None      Theadore Ozell HERO, MD 01/18/24 [phone]
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
## 2024-01-18 NOTE — ED Provider Notes (Signed)
 Emergency Medicine Observation Re-evaluation Note  Katie Pope is a 43 y.o. female, seen on rounds today.  Pt initially presented to the ED for complaints of Altered Mental Status Currently, the patient is resting.  Physical Exam  BP (!) 111/58   Pulse 72   Temp 98.2 F (36.8 C)   Resp 17   Ht 5' 5 (1.651 m)   Wt 72.6 kg   LMP 01/14/2024 (Exact Date)   SpO2 100%   BMI 26.63 kg/m  Physical Exam General: NAD   ED Course / MDM  EKG:   I have reviewed the labs performed to date as well as medications administered while in observation.  Recent changes in the last 24 hours include no acute events reported.  Plan  Current plan is for placement, on IVC hold.    Laurice Maude BROCKS, MD 01/18/24 [phone]

## 2024-01-18 NOTE — Progress Notes (Addendum)
   01/18/24 1500  Psych Admission Type (Psych Patients Only)  Admission Status Involuntary  Psychosocial Assessment  Patient Complaints Depression;Anxiety  Eye Contact Fair  Facial Expression Sad  Affect Depressed  Speech Logical/coherent  Interaction Assertive  Motor Activity Other (Comment) (WDL)  Appearance/Hygiene In scrubs  Behavior Characteristics Irritable  Mood Anxious;Sad  Thought Process  Coherency WDL  Content Blaming others  Delusions WDL  Perception WDL  Hallucination None reported or observed  Judgment Poor  Confusion None  Danger to Self  Current suicidal ideation? Denies  Agreement Not to Harm Self Yes  Description of Agreement verbal  Danger to Others  Danger to Others None reported or observed   Pt presents sad, irritable and depressed. Pt states she is here because  The doctor in the ER has something against me. I was depressed, and having crazy thoughts. Pt reports occasional cocaine use and daily THC use. Pt denies alcohol use. Pt denies ever experiencing suicidal thoughts.

## 2024-01-18 NOTE — ED Notes (Signed)
 PT is not currently happy with the Decision made by behavioral health this morning and would like to know if there is a way she can get another evaluation as she would like to not disappoint her son this weekend.

## 2024-01-18 NOTE — Group Note (Signed)
 Date:  01/18/2024 Time:  2000  Group Topic/Focus:  Group Reflection/Wrap-Up: What's Changed?  Objective: Highlight the power of asking questions to empower oneself in managing mental health. Prompt participants to reflect on their previous understanding of mental health and share how their perception have changed after asking questions throughout the day. Encouraged to use Ask Me 3 as a guide to ask important questions about their care.   Participation Level:  Active  Participation Quality:  Appropriate  Affect:  Appropriate  Cognitive:  Appropriate  Insight: Appropriate  Engagement in Group:  Engaged  Modes of Intervention:  Activity, Discussion, and Education  Additional Comments:  Pt engaged during group.   Katie Pope 01/18/2024, 11:08 PM

## 2024-01-18 NOTE — Telephone Encounter (Signed)
 Post ED Visit - Positive Culture Follow-up  Culture report reviewed by antimicrobial stewardship pharmacist: [facility] [x]  Vernell Meier, Pharm.D. []  Venetia Gully, Pharm.D., BCPS AQ-ID []  Garrel Crews, Pharm.D., BCPS []  Almarie Lunger, Pharm.D., BCPS []  [place], [address].D., BCPS, AAHIVP []  Rosaline Bihari, Pharm.D., BCPS, AAHIVP []  Vernell Meier, PharmD, BCPS []  Latanya Hint, PharmD, BCPS []  Donald Medley, PharmD, BCPS []  Rocky Bold, PharmD []  Dorothyann Alert, PharmD, BCPS []  Morene Babe, PharmD  [facility] []  Rosaline Edison, PharmD []  Romona Bliss, PharmD []  Dolphus Roller, PharmD []  Veva Seip, Rph []  Vernell Daunt) Leonce, PharmD []  Eva Allis, PharmD []  Rosaline Millet, PharmD []  Iantha Batch, PharmD []  Arvin Gauss, PharmD []  Wanda Hasting, PharmD []  Ronal Rav, PharmD []  Rocky Slade, PharmD []  Bard Jeans, PharmD   Positive urine culture patient is currently in the ED and pharmacist will discuss with EDP.   Katie Pope 01/18/2024, 10:09 AM

## 2024-01-19 ENCOUNTER — Encounter (HOSPITAL_COMMUNITY): Payer: Self-pay | Admitting: Psychiatry

## 2024-01-19 MED ORDER — LAMOTRIGINE 25 MG PO TABS
25.0000 mg | ORAL_TABLET | Freq: Every day | ORAL | Status: DC
  Administered 2024-01-19 – 2024-01-22 (×4): 25 mg via ORAL
  Filled 2024-01-19 (×4): qty 1

## 2024-01-19 MED ORDER — MIRTAZAPINE 15 MG PO TABS
15.0000 mg | ORAL_TABLET | Freq: Every day | ORAL | Status: DC
  Administered 2024-01-19 – 2024-01-22 (×4): 15 mg via ORAL
  Filled 2024-01-19 (×4): qty 1

## 2024-01-19 MED ORDER — SULFAMETHOXAZOLE-TRIMETHOPRIM 800-160 MG PO TABS
1.0000 | ORAL_TABLET | Freq: Two times a day (BID) | ORAL | Status: AC
  Administered 2024-01-19 – 2024-01-22 (×6): 1 via ORAL
  Filled 2024-01-19 (×6): qty 1

## 2024-01-19 NOTE — Group Note (Signed)
 Date:  01/19/2024 Time:  11:40 AM  Group Topic/Focus:  Exploring control, boundaries, and coping mechanisms Patients engaged in a creative exercise where they each drew on a blank piece of paper, then passed their papers around, adding to each other's drawings. This activity helped explore the experience of sharing control. Afterward, the group reflected on how it felt to let others contribute to their drawings, and how this mirrors real-life experiences of navigating change.  The conversation then focused on boundaries, with worksheets and scenarios sparking a discussion on healthy vs. unhealthy boundaries and coping strategies. Patients explored how these concepts connect to personal control and how they can positively impact relationships and daily life. The session ended with a focus on how setting healthy boundaries and using positive coping strategies can help restore a sense of control and emotional well-being.   Participation Level:  Did Not Attend  Participation Quality:  N/A  Affect:  N/A  Cognitive:  N/A  Insight: None  Engagement in Group:  None  Modes of Intervention:  N/A  Additional Comments:  Patient did not attend this group.  Kristi HERO Alexandrea Westergard 01/19/2024, 11:40 AM
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
## 2024-01-19 NOTE — [facility] Suicide Risk Assessment (Signed)
 Suicide Risk Assessment  Admission Assessment    [facility] Admission Suicide Risk Assessment   Nursing information obtained from:  Patient Demographic factors:  Caucasian, Low socioeconomic status, Unemployed Current Mental Status:  Suicidal ideation indicated by others Loss Factors:  Financial problems / change in socioeconomic status Historical Factors:  NA Risk Reduction Factors:  Responsible for children under 3 years of age  Total Time spent with patient: 45 minutes Principal Problem: MDD (major depressive disorder), recurrent severe, without psychosis (HCC) Diagnosis:  Principal Problem:   MDD (major depressive disorder), recurrent severe, without psychosis (HCC)  Subjective Data: See H&P   Continued Clinical Symptoms:  Alcohol Use Disorder Identification Test Final Score (AUDIT): 0 The Alcohol Use Disorders Identification Test, Guidelines for Use in Primary Care, Second Edition.  World Science writer [facility]). Score between 0-7:  no or low risk or alcohol related problems. Score between 8-15:  moderate risk of alcohol related problems. Score between 16-19:  high risk of alcohol related problems. Score 20 or above:  warrants further diagnostic evaluation for alcohol dependence and treatment.   CLINICAL FACTORS:   Severe Anxiety and/or Agitation Bipolar Disorder:   Mixed State Depression:   Comorbid alcohol abuse/dependence Impulsivity Alcohol/Substance Abuse/Dependencies More than one psychiatric diagnosis Unstable or Poor Therapeutic Relationship Previous Psychiatric Diagnoses and Treatments Medical Diagnoses and Treatments/Surgeries   Musculoskeletal: Strength & Muscle Tone: within normal limits Gait & Station: normal Patient leans: N/A  Psychiatric Specialty Exam:  Presentation  General Appearance: Casual  Eye Contact:Fair  Speech:Clear and Coherent  Speech Volume:Normal  Handedness:Right   Mood and Affect  Mood:Anxious;  Irritable  Affect:Congruent   Thought Process  Thought Processes:Coherent  Descriptions of Associations:Intact  Orientation:Full (Time, Place and Person)  Thought Content:Rumination (Ruminating on discharge)  History of Schizophrenia/Schizoaffective disorder:No  Duration of Psychotic Symptoms:No data recorded Hallucinations:Hallucinations: None  Ideas of Reference:None  Suicidal Thoughts:Suicidal Thoughts: No  Homicidal Thoughts:Homicidal Thoughts: No   Sensorium  Memory:Immediate Good; Recent Good  Judgment:Poor  Insight:Lacking   Executive Functions  Concentration:Fair  Attention Span:Fair  Recall:Good  Fund of Knowledge:Fair  Language:Fair   Psychomotor Activity  Psychomotor Activity:Psychomotor Activity: Normal   Assets  Assets:Physical Health; Talents/Skills   Sleep  Sleep:Sleep: Fair    Physical Exam: Physical Exam ROS Blood pressure (!) 121/90, pulse 80, temperature 97.7 F (36.5 C), temperature source Oral, resp. rate 16, height 5' 5 (1.651 m), weight 64.1 kg, last menstrual period 01/14/2024, SpO2 100%, unknown if currently breastfeeding. Body mass index is 23.53 kg/m.   COGNITIVE FEATURES THAT CONTRIBUTE TO RISK:  Closed-mindedness    SUICIDE RISK:   Moderate:  Frequent suicidal ideation with limited intensity, and duration, some specificity in terms of plans, no associated intent, good self-control, limited dysphoria/symptomatology, some risk factors present, and identifiable protective factors, including available and accessible social support.  PLAN OF CARE: See H&P.   I certify that inpatient services furnished can reasonably be expected to improve the patient's condition.   Blair Chiquita Hint, NP 01/19/2024, 4:47 PM

## 2024-01-19 NOTE — Progress Notes (Signed)
   01/19/24 2131  Psych Admission Type (Psych Patients Only)  Admission Status Involuntary  Psychosocial Assessment  Patient Complaints Irritability  Eye Contact Fair  Facial Expression Animated  Affect Appropriate to circumstance  Speech Logical/coherent  Interaction Assertive  Motor Activity Other (Comment) (WDL)  Appearance/Hygiene Unremarkable  Behavior Characteristics Irritable  Mood Pleasant  Thought Process  Coherency WDL  Content WDL  Delusions None reported or observed  Perception WDL  Hallucination None reported or observed  Judgment Poor  Confusion None  Danger to Self  Current suicidal ideation? Denies  Agreement Not to Harm Self Yes  Description of Agreement verbal  Danger to Others  Danger to Others None reported or observed

## 2024-01-19 NOTE — Progress Notes (Signed)
 Shift Note  (Sleep Hours) - 7.75  (Any PRNs that were needed, meds refused, or side effects to meds)- PO PRN Hydroxyzine    (Any disturbances and when (visitation, over night)- None  (Concerns raised by the patient)- Pt wanted to talk to provider today. She wants to inquire about discharge and she also inquired about her UTI medications. She verbalized some irritation when urinating.   (SI/HI/AVH)- Denies

## 2024-01-19 NOTE — Group Note (Signed)
 LCSW Group Therapy Note   Group Date: 01/19/2024 Start Time: 1300 End Time: 1400   Type of Therapy and Topic:  Group Therapy: Boundaries  Participation Level:  Did Not Attend  Description of Group: This group will address the use of boundaries in their personal lives. Patients will explore why boundaries are important, the difference between healthy and unhealthy boundaries, and negative and postive outcomes of different boundaries and will look at how boundaries can be crossed.  Patients will be encouraged to identify current boundaries in their own lives and identify what kind of boundary is being set. Facilitators will guide patients in utilizing problem-solving interventions to address and correct types boundaries being used and to address when no boundary is being used. Understanding and applying boundaries will be explored and addressed for obtaining and maintaining a balanced life. Patients will be encouraged to explore ways to assertively make their boundaries and needs known to significant others in their lives, using other group members and facilitator for role play, support, and feedback.  Therapeutic Goals:  1.  Patient will identify areas in their life where setting clear boundaries could be  used to improve their life.  2.  Patient will identify signs/triggers that a boundary is not being respected. 3.  Patient will identify two ways to set boundaries in order to achieve balance in  their lives: 4.  Patient will demonstrate ability to communicate their needs and set boundaries  through discussion and/or role plays  Summary of Patient Progress:  Pt was invited but did not attend  Therapeutic Modalities:   Cognitive Behavioral Therapy Solution-Focused Therapy  Golda Louder, LCSWA 01/19/2024  3:21 PM
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
## 2024-01-19 NOTE — Group Note (Signed)
 Date:  01/19/2024 Time:  6:17 PM  Group Topic/Focus:  Developing a Wellness Toolbox:   The focus of this group is to help patients develop a wellness toolbox with skills and strategies to promote recovery upon discharge. Identifying Needs:   The focus of this group is to help patients identify their personal needs that have been historically problematic and identify healthy behaviors to address their needs.    Participation Level:  Did Not Attend  Participation Quality:    Affect:    Cognitive:    Insight:   Engagement in Group:    Modes of Intervention:    Additional Comments:    Eleanor JAYSON Metro 01/19/2024, 6:17 PM

## 2024-01-19 NOTE — Plan of Care (Signed)
   Problem: Education: Goal: Emotional status will improve Outcome: Progressing Goal: Mental status will improve Outcome: Progressing Goal: Verbalization of understanding the information provided will improve Outcome: Progressing   Problem: Activity: Goal: Interest or engagement in activities will improve Outcome: Progressing

## 2024-01-19 NOTE — Group Note (Signed)
 Date:  01/19/2024 Time:  11:17 AM  Group Topic/Focus:  Goals Group:   The focus of this group is to help patients establish daily goals to achieve during treatment and discuss how the patient can incorporate goal setting into their daily lives to aide in recovery.    Participation Level:  Did Not Attend  Participation Quality:  N/A  Affect:  N/A  Cognitive:  N/A  Insight: None  Engagement in Group:  None  Modes of Intervention:  N/A  Additional Comments:  Patient came into dayroom to get coffee as goals group was starting but stated she did not wish to attend as she was frustrated.  Kristi HERO Cena Bruhn 01/19/2024, 11:17 AM

## 2024-01-20 MED ORDER — PHENAZOPYRIDINE HCL 100 MG PO TABS
100.0000 mg | ORAL_TABLET | Freq: Three times a day (TID) | ORAL | Status: DC | PRN
  Administered 2024-01-21: 100 mg via ORAL
  Filled 2024-01-20: qty 1

## 2024-01-20 NOTE — Progress Notes (Signed)
 Pt states that medication is making her feel very hungover and light headed in the mornings.  Pt apologized for not getting up and being upset this past morning but states she doesn't feel safe to get up at that time until the medication gets more into her system.  Pt does wish to talk to MD about why she has the A1C labs ordered and does not wish to have it drawn until she understands this.  Pt encouraged to let staff know if she is feeling light headed in AM.  Pt did attend evening group and was much brighter.

## 2024-01-20 NOTE — Progress Notes (Signed)
 [facility] MD Progress Note  01/21/2024 7:09 AM Katie Pope  MRN:  [id number]  Principal Problem: MDD (major depressive disorder), recurrent severe, without psychosis (HCC) Diagnosis: Principal Problem:   MDD (major depressive disorder), recurrent severe, without psychosis (HCC)  Total Time spent with patient: 45 minutes   Reason for Admission:  Katie Pope is a 43 year-old-female with a psychiatric history significant for bipolar I disorder, depression, and anxiety, admitted to the [facility] under involuntary commitment from [facility] ED. She presented to the ED with complaints of feeling "floppy," weak, and experiencing low energy throughout the day, shortly after starting ciprofloxacin  for a urinary tract infection. During evaluation, she reportedly disclosed to the ED physician that she had posted several threats of self-harm on Facebook the previous night and showed the posts on her phone. Based on safety concerns, she was placed under involuntary commitment. Urine drug screen was positive for cocaine and THC. Medical history is significant for bronchitis, dyslipidemia, scoliosis, and asthma.   Per IVC Documentation Worsening depression with suicidal ideation.  Making threats against own life on facebook. Worsening depression with suicidal ideation.  Not on meds.  Threats to own life on facebook.   24-hour Chart Review: Chart reviewed. Patient's case discussed in interdisciplinary team meeting.  Vital signs reviewed without critical value. As needed hydroxyzine  required overnight.  She slept a documented 8.25 hours.  She is adherent to taking psychotropic medication regimen.   Per nursing notes: Katie Pope appeared to be angry. Upset she has not talk to the doctor. Writer explain the NP can assist. Katie Pope insist she needs to talk to the doctor. Katie Pope said she will not come out of her room today. Writer tried explain she need to work on Pharmacologist and other  alternatives to handle stressful situations. Katie Pope said she want to see her children. Writer offered her to discuss with RN Delon refused      Daily Evaluation: Katie Pope was seen lying in bed. She described her mood as, "I don't feel good. I feel sick," and later stated, "Now I'm depressed as hell." When asked about symptoms, she repeatedly responded, "To get out of here." Regarding appetite, she reported eating breakfast but expressed no interest in additional meals for the rest of the day. She denied suicidal ideation, including passive thoughts of death or self-harm urges, and denied psychotic symptoms. When asked about cravings, she stated, "I crave money." Regarding contact with her children, she stated, "That's my business. Speaking to them on the phone isn't sufficient enough." She expressed frustration about not having seen a doctor, saying, "I have been here two days and have not seen a doctor. I want to talk to a doctor." She reported plans to remain in her room for the remainder of her hospitalization.   Katie Pope continues to present as irritable and agitated, minimizing the severity of her depression and anxiety symptoms. Although she currently denies suicidal ideation or psychotic symptoms, her admission reportedly followed threats of self-harm on social media, indicating ongoing risk factors and the need for continued inpatient care for safety and stabilization. She is compliant with her routine psychotropic medication and ongoing antibiotic therapy for a urinary tract infection without observed or reported adverse effects. Her refusal to engage in unit activities or groups may impede her recovery process and presents behavioral management challenges.  Plan includes: Refer patient's request to speak with a physician to the attending psychiatrist. Continue current psychotropic medication regimen. Encourage participation in unit groups and activities;  patient was informed of potential  therapeutic room lockout if noncompliance continues. Reinforce treatment goals and the importance of active participation to improve stabilization and discharge planning.     Past Psychiatric History: See H&P   Past Medical History:  Past Medical History:  Diagnosis Date   Anxiety    Asthma    Bipolar 1 disorder (HCC)    Bronchitis    Depression    Dyslipidemia    GERD (gastroesophageal reflux disease)    Scoliosis     Past Surgical History:  Procedure Laterality Date   CARPAL TUNNEL RELEASE Right 11/08/2022   Procedure: EXTENSILE CARPAL TUNNEL RELEASE;  Surgeon: Romona Harari, MD;  Location: [facility];  Service: Orthopedics;  Laterality: Right;  regional 60   FACIAL FRACTURE SURGERY Left    TENOSYNOVECTOMY Right 11/08/2022   Procedure: POSSIBLE TENOSYNOVECTOMY;  Surgeon: Romona Harari, MD;  Location: [facility];  Service: Orthopedics;  Laterality: Right;   Family History:  Family History  Problem Relation Age of Onset   Cancer Paternal Grandfather    Dementia Paternal Grandmother    Diabetes Mother    Family Psychiatric  History: See H&P  Social History:  Social History   Substance and Sexual Activity  Alcohol Use Not Currently   Comment: occ     Social History   Substance and Sexual Activity  Drug Use Yes   Types: Marijuana   Comment: last use 10/30/2022    Social History   Socioeconomic History   Marital status: Single    Spouse name: Not on file   Number of children: Not on file   Years of education: Not on file   Highest education level: Not on file  Occupational History   Not on file  Tobacco Use   Smoking status: Some Days    Current packs/day: 0.50    Average packs/day: 0.5 packs/day for 0.7 years (0.4 ttl pk-yrs)    Types: Cigarettes    Start date: 2025    Last attempt to quit: 12/23/2011   Smokeless tobacco: Former  Building services engineer status: Never Used  Substance and Sexual Activity   Alcohol use:  Not Currently    Comment: occ   Drug use: Yes    Types: Marijuana    Comment: last use 10/30/2022   Sexual activity: Not Currently    Birth control/protection: I.U.D.  Other Topics Concern   Not on file  Social History Narrative   Lives with 65 year old daughter and 68 year old son.     Social Drivers of Health   Financial Resource Strain: Not at Risk (06/15/2022)   Received from [employer]    Financial Resource Strain: 1  Food Insecurity: No Food Insecurity (01/18/2024)   Hunger Vital Sign    Worried About Running Out of Food in the Last Year: Never true    Ran Out of Food in the Last Year: Never true  Transportation Needs: No Transportation Needs (01/18/2024)   PRAPARE - Administrator, Civil Service (Medical): No    Lack of Transportation (Non-Medical): No  Physical Activity: Not on File (05/26/2022)   Received from [facility]   Physical Activity    Physical Activity: 0  Stress: Not on File (05/26/2022)   Received [facility]   Stress    Stress: 0  Social Connections: Not on File (12/26/2022)   Received from [facility]   Social Connections    Connectedness: 0  Additional Social History:                         Current Medications: Current Facility-Administered Medications  Medication Dose Route Frequency Provider Last Rate Last Admin   acetaminophen  (TYLENOL ) tablet 650 mg  650 mg Oral Q6H PRN Mannie Jerel PARAS, NP   650 mg at 01/20/24 1451   alum & mag hydroxide-simeth (MAALOX/MYLANTA) 200-200-20 MG/5ML suspension 30 mL  30 mL Oral Q4H PRN Mannie Jerel PARAS, NP       haloperidol  (HALDOL ) tablet 5 mg  5 mg Oral TID PRN Mannie Jerel PARAS, NP       And   diphenhydrAMINE  (BENADRYL ) capsule 50 mg  50 mg Oral TID PRN Mannie Jerel PARAS, NP       haloperidol  lactate (HALDOL ) injection 5 mg  5 mg Intramuscular TID PRN Mannie Jerel PARAS, NP       And   diphenhydrAMINE  (BENADRYL ) injection 50 mg  50 mg Intramuscular TID PRN Mannie Jerel PARAS, NP        And   LORazepam  (ATIVAN ) injection 2 mg  2 mg Intramuscular TID PRN Mannie Jerel PARAS, NP       haloperidol  lactate (HALDOL ) injection 10 mg  10 mg Intramuscular TID PRN Mannie Jerel PARAS, NP       And   diphenhydrAMINE  (BENADRYL ) injection 50 mg  50 mg Intramuscular TID PRN Mannie Jerel PARAS, NP       And   LORazepam  (ATIVAN ) injection 2 mg  2 mg Intramuscular TID PRN Mannie Jerel PARAS, NP       hydrOXYzine  (ATARAX ) tablet 25 mg  25 mg Oral TID PRN Randall Starlyn HERO, NP   25 mg at 01/20/24 2041   lamoTRIgine (LAMICTAL) tablet 25 mg  25 mg Oral QHS Malyah Ohlrich H, NP   25 mg at 01/20/24 2041   magnesium  hydroxide (MILK OF MAGNESIA) suspension 30 mL  30 mL Oral Daily PRN Mannie Jerel PARAS, NP       mirtazapine (REMERON) tablet 15 mg  15 mg Oral QHS Konnor Jorden H, NP   15 mg at 01/20/24 2041   nicotine  (NICODERM CQ  - dosed in mg/24 hours) patch 21 mg  21 mg Transdermal Q0600 Mannie Jerel PARAS, NP   21 mg at 01/18/24 1638   phenazopyridine  (PYRIDIUM ) tablet 100 mg  100 mg Oral TID WC PRN Prentis Kitchens A, DO       pneumococcal 20-valent conjugate vaccine (PREVNAR 20 ) injection 0.5 mL  0.5 mL Intramuscular Tomorrow-1000 Pashayan, Alexander S, DO       sulfamethoxazole-trimethoprim (BACTRIM DS) 800-160 MG per tablet 1 tablet  1 tablet Oral Q12H Zandrea Kenealy H, NP   1 tablet at 01/20/24 1932    Lab Results: No results found for this or any previous visit (from the past 48 hours).  Blood Alcohol level:  Lab Results  Component Value Date   [facility] <15 01/17/2024    Metabolic Disorder Labs: No results found for: HGBA1C, MPG No results found for: PROLACTIN Lab Results  Component Value Date   CHOL 142 03/14/2023   TRIG 155 (H) 03/14/2023   HDL 46 03/14/2023   CHOLHDL 3.1 03/14/2023   VLDL 23 07/04/2013   LDLCALC 69 03/14/2023   LDLCALC 67 07/04/2013    Physical Findings: AIMS:  ,  ,  ,  ,  ,  ,   CIWA:    COWS:     Musculoskeletal: Strength &  Muscle Tone: within  normal limits Gait & Station: normal Patient leans: N/A  Psychiatric Specialty Exam:  Presentation  General Appearance:  Casual  Eye Contact: Fair  Speech: Clear and Coherent  Speech Volume: Normal  Handedness: Right   Mood and Affect  Mood: Anxious; Irritable  Affect: Congruent   Thought Process  Thought Processes: Coherent  Descriptions of Associations:Intact  Orientation:Full (Time, Place and Person)  Thought Content:Rumination (Ruminating on discharge)  History of Schizophrenia/Schizoaffective disorder:No  Duration of Psychotic Symptoms:No data recorded Hallucinations:No data recorded  Ideas of Reference:None  Suicidal Thoughts:No data recorded  Homicidal Thoughts:No data recorded   Sensorium  Memory: Immediate Good; Recent Good  Judgment: Poor  Insight: Lacking   Executive Functions  Concentration: Fair  Attention Span: Fair  Recall: Good  Fund of Knowledge: Fair  Language: Fair   Psychomotor Activity  Psychomotor Activity: No data recorded   Assets  Assets: Physical Health; Talents/Skills   Sleep  Sleep: No data recorded    Physical Exam: Physical Exam Vitals and nursing note reviewed.  Constitutional:      General: She is not in acute distress.    Appearance: She is not ill-appearing.  HENT:     Mouth/Throat:     Pharynx: Oropharynx is clear.  Cardiovascular:     Pulses: Normal pulses.  Pulmonary:     Effort: No respiratory distress.  Skin:    General: Skin is dry.  Neurological:     General: No focal deficit present.     Mental Status: She is alert and oriented to person, place, and time.    Review of Systems  Constitutional: Negative.   HENT: Negative.    Respiratory: Negative.    Cardiovascular: Negative.   Gastrointestinal: Negative.   Genitourinary:  Positive for dysuria.  Skin: Negative.   Neurological: Negative.   Psychiatric/Behavioral:  Positive for depression and substance  abuse. Negative for hallucinations, memory loss and suicidal ideas. The patient is nervous/anxious.    Blood pressure (!) 109/91, pulse 78, temperature 98.4 F (36.9 C), temperature source Oral, resp. rate 16, height 5' 5 (1.651 m), weight 64.1 kg, last menstrual period 01/14/2024, SpO2 100%, unknown if currently breastfeeding. Body mass index is 23.53 kg/m.   Treatment Plan Summary: Daily contact with patient to assess and evaluate symptoms and progress in treatment and Medication management  # Bipolar, mixed  # GAD - Continue Lamictal at 25 mg daily at bedtime - Continue Remeron 15 mg daily at bedtime, appetite stimulation   - Hydroxyzine  25 mg 3 times daily as needed, anxiety - Trazodone 50 mg at bedtime as needed, sleep  - BH Haldol  agitation protocol (see MAR)   Medical   # UTI  - Continue Bactrim DS 800-160 mg every 12 hours x 3 days 01/19/2024    Observation Level/Precautions:  15 minute checks  Laboratory:  UDS - + Cocaine, Tetrahydrocannabinol   Urine Culture 01/14/2024 - >=100,000 COLONIES/mL ESCHERICHIA COLI    New lab orders  Lipid panel, hemoglobin A1c   EKG - Normal sinus rhythm  QT QTc 380/416    Psychotherapy: Group therapies  Medications: As listed above  Consultations: As needed  Discharge Concerns: Safety, medication compliance  Estimated LOS: 4 to 7 days  Other:       Safety and Monitoring:             --  Involuntary admission to inpatient psychiatric unit for safety, stabilization and treatment             --  Daily contact with patient to assess and evaluate symptoms and progress in treatment             -- Patient's case to be discussed in multi-disciplinary team meeting             -- Precautions: suicide, elopement, and assault              --  The risks/benefits/side-effects/alternatives to this medication were discussed in detail with the patient and time was given for questions. The patient consents to medication trial.  -- FDA              -- Metabolic profile and EKG monitoring obtained while on an atypical antipsychotic (BMI: Lipid Panel: HbgA1c: QTc:)              -- Encouraged patient to participate in unit milieu and in scheduled group therapies              -- Short Term Goals: Ability to identify changes in lifestyle to reduce recurrence of condition will improve             -- Long Term Goals: Improvement in symptoms so as ready for discharge                Discharge Planning:              -- Social work and case management to assist with discharge planning and identification of hospital follow-up needs prior to discharge             -- Estimated LOS: 4-7 days             -- Discharge Concerns: Need to establish a safety plan; Medication compliance and effectiveness             -- Discharge Goals: Return home with outpatient referrals for mental health follow-up including medication management/psychotherapy      I certify that inpatient services furnished can reasonably be expected to improve the patient's conditio    Blair Chiquita Hint, NP 01/21/2024, 7:09 AM

## 2024-01-20 NOTE — Plan of Care (Signed)
   Problem: Education: Goal: Emotional status will improve Outcome: Progressing Goal: Mental status will improve Outcome: Progressing Goal: Verbalization of understanding the information provided will improve Outcome: Progressing   Problem: Activity: Goal: Interest or engagement in activities will improve Outcome: Progressing

## 2024-01-20 NOTE — Progress Notes (Signed)
 Katie Pope appeared to be angry. Upset she has not talk to the doctor. Writer explain the NP can assist. Katie Pope insist she needs to talk to the doctor. Katie Pope said she will not come out of her room today. Writer tried explain she need to work on Pharmacologist and other alternatives to handle stressful situations. Katie Pope said she want to see her children. Writer offered her to discuss with RN Delon refused

## 2024-01-20 NOTE — Group Note (Signed)
 Date:  01/20/2024 Time:  1:55 PM  Group Topic/Focus:  Emotional Education:   The focus of this group is to discuss what feelings/emotions are, and how they are experienced.    Participation Level:  Did Not Attend  Participation Quality:  Did Not Attend  Affect:  Did Not Attend  Cognitive:  Did Not Attend  Insight: None  Engagement in Group:  Did Not Attend  Modes of Intervention:  Did Not Attend  Additional Comments:  Did Not Attend  Avelina DELENA Humphreys 01/20/2024, 1:55 PM

## 2024-01-20 NOTE — Group Note (Signed)
 Date:  01/20/2024 Time:  1:06 PM  Group Topic/Focus:  Goals Group:   The focus of this group is to help patients establish daily goals to achieve during treatment and discuss how the patient can incorporate goal setting into their daily lives to aide in recovery.    Participation Level:  Did Not Attend  Participation Quality:  Did Not Attend  Affect:  Did Not Attend  Cognitive:  Did Not Attend  Insight: None  Engagement in Group:  Did Not Attend  Modes of Intervention:  Did Not Attend  Additional Comments:    Katie Pope 01/20/2024, 1:06 PM

## 2024-01-20 NOTE — Group Note (Signed)
 Date:  01/20/2024 Time:  7:40 PM    Group Topic/Focus:  Group used a non- competitive card game to build mindful listening skills, and encourage acceptance and understanding of oneself and peers. Patients share, fostering empathy and personal growth in a supportive environment.     Participation Level:  Did Not Attend  Katie Pope 01/20/2024, 7:40 PM

## 2024-01-20 NOTE — Plan of Care (Signed)
   Problem: Education: Goal: Verbalization of understanding the information provided will improve Outcome: Progressing   Problem: Activity: Goal: Interest or engagement in activities will improve Outcome: Progressing

## 2024-01-20 NOTE — Group Note (Signed)
 Date:  01/20/2024 Time:  10:35 PM  Group Topic/Focus:  Wrap-Up Group:   The focus of this group is to help patients review their daily goal of treatment and discuss progress on daily workbooks.    Participation Level:  Did Not Attend  Participation Quality:  Did not participate   Affect:  Resistant  Cognitive:  Lacking  Insight: None  Engagement in Group:  None  Modes of Intervention:  Discussion  Additional Comments:  Patient was present but did not participate   Bari Moats 01/20/2024, 10:35 PM

## 2024-01-20 NOTE — Plan of Care (Signed)
   Problem: Activity: Goal: Interest or engagement in activities will improve Outcome: Progressing   Problem: Coping: Goal: Ability to verbalize frustrations and anger appropriately will improve Outcome: Progressing

## 2024-01-20 NOTE — Progress Notes (Signed)
(  Sleep Hours) - 8.25 hours (Any PRNs that were needed, meds refused, or side effects to meds)-  Vistaril  25 mg (Any disturbances and when (visitation, over night)- None (Concerns raised by the patient)-  None (SI/HI/AVH)-  Denies

## 2024-01-20 NOTE — Progress Notes (Signed)
   01/20/24 2238  Psych Admission Type (Psych Patients Only)  Admission Status Involuntary  Psychosocial Assessment  Patient Complaints Irritability  Eye Contact Fair  Facial Expression Flat  Affect Appropriate to circumstance  Speech Logical/coherent  Interaction Assertive  Motor Activity Other (Comment) (WDL)  Appearance/Hygiene Unremarkable  Behavior Characteristics Appropriate to situation  Mood Depressed;Irritable  Thought Process  Coherency WDL  Content WDL  Delusions None reported or observed  Perception WDL  Hallucination None reported or observed  Judgment WDL  Confusion None  Danger to Self  Current suicidal ideation? Denies  Agreement Not to Harm Self Yes  Description of Agreement Verbal  Danger to Others  Danger to Others None reported or observed

## 2024-01-21 ENCOUNTER — Encounter (HOSPITAL_COMMUNITY): Payer: Self-pay

## 2024-01-21 MED ORDER — PANTOPRAZOLE SODIUM 40 MG PO TBEC
40.0000 mg | DELAYED_RELEASE_TABLET | Freq: Every day | ORAL | Status: DC
  Administered 2024-01-21 – 2024-01-23 (×3): 40 mg via ORAL
  Filled 2024-01-21 (×3): qty 1

## 2024-01-21 NOTE — Progress Notes (Signed)
   01/21/24 0856  Psych Admission Type (Psych Patients Only)  Admission Status Involuntary  Psychosocial Assessment  Patient Complaints Anxiety  Eye Contact Brief  Facial Expression Flat  Affect Irritable  Speech Logical/coherent  Interaction Assertive  Motor Activity Other (Comment) (WDL)  Appearance/Hygiene Unremarkable  Behavior Characteristics Irritable  Mood Irritable  Thought Process  Coherency WDL  Content WDL  Delusions None reported or observed  Perception WDL  Hallucination None reported or observed  Judgment Poor  Confusion None  Danger to Self  Current suicidal ideation? Denies  Agreement Not to Harm Self Yes  Description of Agreement verbal  Danger to Others  Danger to Others None reported or observed

## 2024-01-21 NOTE — Group Note (Signed)
 Date:  01/21/2024 Time:  4:04 PM  Group Topic/Focus: Grief Market researcher  Emotional Education:   The focus of this group is to discuss what feelings/emotions are, and how they are experienced. Managing Feelings:   The focus of this group is to identify what feelings patients have difficulty handling and develop a plan to handle them in a healthier way upon discharge. Grief is a journey through emotions that can change  from day to day, or even moment to moment. These  twists and turns often feel unsettling--a lot like being  on a roller coaster. Assignment: Label Roller coaster for your unique emotional needs.      Additional Comments:  patient did not attend   Katie Pope Katie Pope 01/21/2024, 4:04 PM

## 2024-01-21 NOTE — Group Note (Deleted)
 Date:  01/21/2024 Time:  8:44 PM  Group Topic/Focus:  Wrap-Up Group:   The focus of this group is to help patients review their daily goal of treatment and discuss progress on daily workbooks.     Participation Level:  {BHH PARTICIPATION OZCZO:77735}  Participation Quality:  {BHH PARTICIPATION QUALITY:22265}  Affect:  {BHH AFFECT:22266}  Cognitive:  {BHH COGNITIVE:22267}  Insight: {BHH Insight2:20797}  Engagement in Group:  {BHH ENGAGEMENT IN HMNLE:77731}  Modes of Intervention:  {BHH MODES OF INTERVENTION:22269}  Additional Comments:  ***  Katie Pope A Creed Kail 01/21/2024, 8:44 PM

## 2024-01-21 NOTE — [facility] Suicide Risk Assessment (Signed)
 [facility] INPATIENT:  Family/Significant Other Suicide Prevention Education  Suicide Prevention Education:  Education Completed; Ricardo Sar (friend) [phone],  (name of family member/significant other) has been identified by the patient as the family member/significant other with whom the patient will be residing, and identified as the person(s) who will aid the patient in the event of a mental health crisis (suicidal ideations/suicide attempt).  With written consent from the patient, the family member/significant other has been provided the following suicide prevention education, prior to the and/or following the discharge of the patient.  Reports patient has never made suicidal statements in the past. Does not have access to weapons or firearms. Will pick patient up at discharge. Reports when she is released he will ensure she makes it to her appointments for MM and therapy.   The suicide prevention education provided includes the following: Suicide risk factors Suicide prevention and interventions National Suicide Hotline telephone number [facility] assessment telephone number [facility] Emergency Assistance 911 [facility] and/or Residential Mobile Crisis Unit telephone number  Request made of family/significant other to: Remove weapons (e.g., guns, rifles, knives), all items previously/currently identified as safety concern.   Remove drugs/medications (over-the-counter, prescriptions, illicit drugs), all items previously/currently identified as a safety concern.  The family member/significant other verbalizes understanding of the suicide prevention education information provided.  The family member/significant other agrees to remove the items of safety concern listed above.  Jenkins LULLA Primer 01/21/2024, 3:25 PM

## 2024-01-21 NOTE — Progress Notes (Signed)
 [facility] MD Progress Note  01/21/2024 6:30 PM AMSI GRIMLEY  MRN:  [id number]  Principal Problem: MDD (major depressive disorder), recurrent severe, without psychosis (HCC) Diagnosis: Principal Problem:   MDD (major depressive disorder), recurrent severe, without psychosis (HCC)  Total Time spent with patient: 45 minutes   Reason for Admission:  Katie Pope is a 43 year-old-female with a psychiatric history significant for bipolar I disorder, depression, and anxiety, admitted to the [facility] under involuntary commitment from [facility] ED. She presented to the ED with complaints of feeling "floppy," weak, and experiencing low energy throughout the day, shortly after starting ciprofloxacin  for a urinary tract infection. During evaluation, she reportedly disclosed to the ED physician that she had posted several threats of self-harm on Facebook the previous night and showed the posts on her phone. Based on safety concerns, she was placed under involuntary commitment. Urine drug screen was positive for cocaine and THC. Medical history is significant for bronchitis, dyslipidemia, scoliosis, and asthma.   Per IVC Documentation Worsening depression with suicidal ideation.  Making threats against own life on facebook. Worsening depression with suicidal ideation.  Not on meds.  Threats to own life on facebook.   24-hour Chart Review: Chart reviewed. Patient's case discussed in interdisciplinary team meeting.  Vital signs reviewed without critical value. As needed hydroxyzine  required overnight.  She slept a documented 8.25 hours.  She is adherent to taking psychotropic medication regimen.   Per nursing notes: Patient stated she usually takes a pill at home for indigestion.     Daily Evaluation: Katie Pope was observed sitting on the side of the bed in casual clothing, eating chips. She reports her mood as "I'm good" and expresses a desire to "get back to the money." She denies  anxiety or depressive symptoms. She denies suicidal ideation, including passive thoughts of death or self-harm urges. Her sleep is reported as adequate, and she reports improvement in her appetite. She denies side effects from her current psychiatric medications. Katie apologizes for her actions the previous day, stating, "I took it the wrong way and got mad," and attributes her behavior to being "just mad yesterday morning." She expresses a desire to discharge soon, stating, "I'm in here for no reason," and anticipates her attorney will agree.   Katie Pope appears more engaged and less irritable compared to previous interactions. She is compliant with her medication regimen and has not required the use of the agitation protocol. Nursing notes indicate that she reported she normally takes a pill at home for indigestion. A chart review reveals a prescription for omeprazole 20 mg daily. Overall, she demonstrates improved mood and behavior, with no new psychiatric or medical concerns.  Plan: Continue current psychiatric medications. Initiate pantoprazole  40 mg daily to address gastrointestinal symptoms.  Anticipate discharge on Wednesday, October 1, pending continued clinical stability. Social work to coordinate outpatient aftercare appointments, including follow-up with outpatient therapy and medication management services.    Past Psychiatric History: See H&P   Past Medical History:  Past Medical History:  Diagnosis Date   Anxiety    Asthma    Bipolar 1 disorder (HCC)    Bronchitis    Depression    Dyslipidemia    GERD (gastroesophageal reflux disease)    Scoliosis     Past Surgical History:  Procedure Laterality Date   CARPAL TUNNEL RELEASE Right 11/08/2022   Procedure: EXTENSILE CARPAL TUNNEL RELEASE;  Surgeon: Romona Harari, MD;  Location: [facility];  Service: Orthopedics;  Laterality: Right;  regional 60   FACIAL FRACTURE SURGERY Left    TENOSYNOVECTOMY Right  11/08/2022   Procedure: POSSIBLE TENOSYNOVECTOMY;  Surgeon: Romona Harari, MD;  Location: [facility];  Service: Orthopedics;  Laterality: Right;   Family History:  Family History  Problem Relation Age of Onset   Cancer Paternal Grandfather    Dementia Paternal Grandmother    Diabetes Mother    Family Psychiatric  History: See H&P  Social History:  Social History   Substance and Sexual Activity  Alcohol Use Not Currently   Comment: occ     Social History   Substance and Sexual Activity  Drug Use Yes   Types: Marijuana   Comment: last use 10/30/2022    Social History   Socioeconomic History   Marital status: Single    Spouse name: Not on file   Number of children: Not on file   Years of education: Not on file   Highest education level: Not on file  Occupational History   Not on file  Tobacco Use   Smoking status: Some Days    Current packs/day: 0.50    Average packs/day: 0.5 packs/day for 0.7 years (0.4 ttl pk-yrs)    Types: Cigarettes    Start date: 2025    Last attempt to quit: 12/23/2011   Smokeless tobacco: Former  Building services engineer status: Never Used  Substance and Sexual Activity   Alcohol use: Not Currently    Comment: occ   Drug use: Yes    Types: Marijuana    Comment: last use 10/30/2022   Sexual activity: Not Currently    Birth control/protection: I.U.D.  Other Topics Concern   Not on file  Social History Narrative   Lives with 83 year old daughter and 52 year old son.     Social Drivers of Health   Financial Resource Strain: Not at Risk (06/15/2022)   Received from [employer]    Financial Resource Strain: 1  Food Insecurity: No Food Insecurity (01/18/2024)   Hunger Vital Sign    Worried About Running Out of Food in the Last Year: Never true    Ran Out of Food in the Last Year: Never true  Transportation Needs: No Transportation Needs (01/18/2024)   PRAPARE - Administrator, Civil Service  (Medical): No    Lack of Transportation (Non-Medical): No  Physical Activity: Not on File (05/26/2022)   Received [facility]   Physical Activity    Physical Activity: 0  Stress: Not on File (05/26/2022)   Received from [facility]   Stress    Stress: 0  Social Connections: Not on File (12/26/2022)   Received from [facility]   Social Connections    Connectedness: 0   Additional Social History:                         Current Medications: Current Facility-Administered Medications  Medication Dose Route Frequency Provider Last Rate Last Admin   acetaminophen  (TYLENOL ) tablet 650 mg  650 mg Oral Q6H PRN Mannie Jerel PARAS, NP   650 mg at 01/21/24 1806   alum & mag hydroxide-simeth (MAALOX/MYLANTA) 200-200-20 MG/5ML suspension 30 mL  30 mL Oral Q4H PRN Mannie Jerel PARAS, NP       haloperidol  (HALDOL ) tablet 5 mg  5 mg Oral TID PRN Mannie Jerel PARAS, NP       And   diphenhydrAMINE  (BENADRYL ) capsule 50  mg  50 mg Oral TID PRN Mannie Jerel PARAS, NP       haloperidol  lactate (HALDOL ) injection 5 mg  5 mg Intramuscular TID PRN Mannie Jerel PARAS, NP       And   diphenhydrAMINE  (BENADRYL ) injection 50 mg  50 mg Intramuscular TID PRN Mannie Jerel PARAS, NP       And   LORazepam  (ATIVAN ) injection 2 mg  2 mg Intramuscular TID PRN Mannie Jerel PARAS, NP       haloperidol  lactate (HALDOL ) injection 10 mg  10 mg Intramuscular TID PRN Mannie Jerel PARAS, NP       And   diphenhydrAMINE  (BENADRYL ) injection 50 mg  50 mg Intramuscular TID PRN Mannie Jerel PARAS, NP       And   LORazepam  (ATIVAN ) injection 2 mg  2 mg Intramuscular TID PRN Mannie Jerel PARAS, NP       hydrOXYzine  (ATARAX ) tablet 25 mg  25 mg Oral TID PRN Randall Starlyn HERO, NP   25 mg at 01/21/24 1806   lamoTRIgine (LAMICTAL) tablet 25 mg  25 mg Oral QHS Nashia Remus H, NP   25 mg at 01/20/24 2041   magnesium  hydroxide (MILK OF MAGNESIA) suspension 30 mL  30 mL Oral Daily PRN Mannie Jerel PARAS, NP       mirtazapine (REMERON) tablet 15 mg  15 mg Oral  QHS Paz Winsett H, NP   15 mg at 01/20/24 2041   nicotine  (NICODERM CQ  - dosed in mg/24 hours) patch 21 mg  21 mg Transdermal Q0600 Mannie Jerel PARAS, NP   21 mg at 01/18/24 1638   phenazopyridine  (PYRIDIUM ) tablet 100 mg  100 mg Oral TID WC PRN Prentis Kitchens A, DO   100 mg at 01/21/24 9088   pneumococcal 20-valent conjugate vaccine (PREVNAR 20 ) injection 0.5 mL  0.5 mL Intramuscular Tomorrow-1000 Pashayan, Alexander S, DO       sulfamethoxazole-trimethoprim (BACTRIM DS) 800-160 MG per tablet 1 tablet  1 tablet Oral Q12H Dorance Spink H, NP   1 tablet at 01/21/24 0900    Lab Results: No results found for this or any previous visit (from the past 48 hours).  Blood Alcohol level:  Lab Results  Component Value Date   [facility] <15 01/17/2024    Metabolic Disorder Labs: No results found for: HGBA1C, MPG No results found for: PROLACTIN Lab Results  Component Value Date   CHOL 142 03/14/2023   TRIG 155 (H) 03/14/2023   HDL 46 03/14/2023   CHOLHDL 3.1 03/14/2023   VLDL 23 07/04/2013   LDLCALC 69 03/14/2023   LDLCALC 67 07/04/2013    Physical Findings: AIMS:  ,  ,  ,  ,  ,  ,   CIWA:    COWS:     Musculoskeletal: Strength & Muscle Tone: within normal limits Gait & Station: normal Patient leans: N/A  Psychiatric Specialty Exam:  Presentation  General Appearance:  Casual  Eye Contact: Fair  Speech: Clear and Coherent  Speech Volume: Normal  Handedness: Right   Mood and Affect  Mood: Anxious; Irritable  Affect: Congruent   Thought Process  Thought Processes: Coherent  Descriptions of Associations:Intact  Orientation:Full (Time, Place and Person)  Thought Content:Rumination (Ruminating on discharge)  History of Schizophrenia/Schizoaffective disorder:No  Duration of Psychotic Symptoms:No data recorded Hallucinations:No data recorded  Ideas of Reference:None  Suicidal Thoughts:No data recorded  Homicidal Thoughts:No data  recorded   Sensorium  Memory: Immediate Good; Recent Good  Judgment: Poor  Insight:  Lacking   Executive Functions  Concentration: Fair  Attention Span: Fair  Recall: [employer] of Knowledge: Fair  Language: Fair   Psychomotor Activity  Psychomotor Activity: No data recorded   Assets  Assets: Physical Health; Talents/Skills   Sleep  Sleep: No data recorded    Physical Exam: Physical Exam Vitals and nursing note reviewed.  Constitutional:      General: She is not in acute distress.    Appearance: She is not ill-appearing.  HENT:     Mouth/Throat:     Pharynx: Oropharynx is clear.  Cardiovascular:     Pulses: Normal pulses.  Pulmonary:     Effort: No respiratory distress.  Skin:    General: Skin is dry.  Neurological:     General: No focal deficit present.     Mental Status: She is alert and oriented to person, place, and time.    Review of Systems  Constitutional: Negative.   HENT: Negative.    Respiratory: Negative.    Cardiovascular: Negative.   Gastrointestinal: Negative.   Genitourinary:  Positive for dysuria.  Skin: Negative.   Neurological: Negative.   Psychiatric/Behavioral:  Positive for depression and substance abuse. Negative for hallucinations, memory loss and suicidal ideas. The patient is nervous/anxious.    Blood pressure (!) 109/91, pulse 78, temperature 98.4 F (36.9 C), temperature source Oral, resp. rate 16, height 5' 5 (1.651 m), weight 64.1 kg, last menstrual period 01/14/2024, SpO2 100%, unknown if currently breastfeeding. Body mass index is 23.53 kg/m.   Treatment Plan Summary: Daily contact with patient to assess and evaluate symptoms and progress in treatment and Medication management  # Bipolar, mixed  # GAD - Continue Lamictal at 25 mg daily at bedtime - Continue Remeron 15 mg daily at bedtime, appetite stimulation   - Hydroxyzine  25 mg 3 times daily as needed, anxiety - Trazodone 50 mg at bedtime as  needed, sleep  - BH Haldol  agitation protocol (see MAR)   Medical # GERD  - Start Protonix  EC 40 mg daily    # UTI  - Continue Bactrim DS 800-160 mg every 12 hours x 3 days 01/19/2024    Observation Level/Precautions:  15 minute checks  Laboratory:  UDS - + Cocaine, Tetrahydrocannabinol   Urine Culture 01/14/2024 - >=100,000 COLONIES/mL ESCHERICHIA COLI    New lab orders  Lipid panel, hemoglobin A1c   EKG - Normal sinus rhythm  QT QTc 380/416    Psychotherapy: Group therapies  Medications: As listed above  Consultations: As needed  Discharge Concerns: Safety, medication compliance  Estimated LOS: 4 to 7 days  Other:       Safety and Monitoring:             --  Involuntary admission to inpatient psychiatric unit for safety, stabilization and treatment             -- Daily contact with patient to assess and evaluate symptoms and progress in treatment             -- Patient's case to be discussed in multi-disciplinary team meeting             -- Precautions: suicide, elopement, and assault              --  The risks/benefits/side-effects/alternatives to this medication were discussed in detail with the patient and time was given for questions. The patient consents to medication trial.  -- FDA             --  Metabolic profile and EKG monitoring obtained while on an atypical antipsychotic (BMI: Lipid Panel: HbgA1c: QTc:)              -- Encouraged patient to participate in unit milieu and in scheduled group therapies              -- Short Term Goals: Ability to identify changes in lifestyle to reduce recurrence of condition will improve             -- Long Term Goals: Improvement in symptoms so as ready for discharge                Discharge Planning:              -- Social work and case management to assist with discharge planning and identification of hospital follow-up needs prior to discharge             -- Estimated LOS: 4-7 days             -- Discharge Concerns: Need to  establish a safety plan; Medication compliance and effectiveness             -- Discharge Goals: Return home with outpatient referrals for mental health follow-up including medication management/psychotherapy      I certify that inpatient services furnished can reasonably be expected to improve the patient's conditio    Blair Chiquita Hint, NP 01/21/2024, 6:30 PM Patient ID: Katie Pope, female   DOB: Dec 12, 1980, 43 y.o.   MRN: [id number]

## 2024-01-21 NOTE — Progress Notes (Signed)
(  Sleep Hours) - 8.5 hours (Any PRNs that were needed, meds refused, or side effects to meds)- Vistaril  25 mg (Any disturbances and when (visitation, over night)-  None (Concerns raised by the patient)-  None (SI/HI/AVH)-  Denies

## 2024-01-21 NOTE — Group Note (Signed)
 Recreation Therapy Group Note   Group Topic:Team Building  Group Date: 01/21/2024 Start Time: 0955 End Time: 1025 Facilitators: Matheo Rathbone-McCall, LRT,CTRS Location: [address] Dayroom   Group Topic: Communication, Team Building, Problem Solving  Goal Area(s) Addresses:  Patient will effectively work with peer towards shared goal.  Patient will identify skills used to make activity successful.  Patient will identify how skills used during activity can be applied to reach post d/c goals.   Behavioral Response:   Intervention: STEM Activity- Glass blower/designer  Activity: Tallest [employer]. In teams of 5-6, patients were given 11 craft pipe cleaners. Using the materials provided, patients were instructed to compete again the opposing team(s) to build the tallest free-standing structure from floor level. The activity was timed; difficulty increased by Clinical research associate as Production designer, theatre/television/film continued.  Systematically resources were removed with additional directions for example, placing one arm behind their back, working in silence, and shape stipulations. LRT facilitated post-activity discussion reviewing team processes and necessary communication skills involved in completion. Patients were encouraged to reflect how the skills utilized, or not utilized, in this activity can be incorporated to positively impact support systems post discharge.  Education: Pharmacist, community, Scientist, physiological, Discharge Planning   Education Outcome: Acknowledges education/In group clarification offered/Needs additional education.    Affect/Mood: N/A   Participation Level: Did not attend    Clinical Observations/Individualized Feedback:     Plan: Continue to engage patient in RT group sessions 2-3x/week.   Any Mcneice-McCall, LRT,CTRS 01/21/2024 12:59 PM

## 2024-01-21 NOTE — Group Note (Signed)
 Date:  01/21/2024 Time:  11:51 AM  Group Topic/Focus:  Emotional Education:   The focus of this group is to discuss what feelings/emotions are, and how they are experienced.    Participation Level:  Did Not Attend  Participation Quality:  Did Not Attend  Affect:  Did Not Attend  Cognitive:  Did Not Attend  Insight: None  Engagement in Group:  Did Not Attend  Modes of Intervention:  Did Not Attend  Additional Comments:  Did Not Attend  Katie Pope 01/21/2024, 11:51 AM

## 2024-01-21 NOTE — BH IP Treatment Plan (Signed)
 Interdisciplinary Treatment and Diagnostic Plan Update  01/21/2024 Time of Session: 11:25am Katie Pope MRN: [id number]  Principal Diagnosis: MDD (major depressive disorder), recurrent severe, without psychosis (HCC)  Secondary Diagnoses: Principal Problem:   MDD (major depressive disorder), recurrent severe, without psychosis (HCC)   Current Medications:  Current Facility-Administered Medications  Medication Dose Route Frequency Provider Last Rate Last Admin   acetaminophen  (TYLENOL ) tablet 650 mg  650 mg Oral Q6H PRN Mannie Jerel PARAS, NP   650 mg at 01/21/24 0900   alum & mag hydroxide-simeth (MAALOX/MYLANTA) 200-200-20 MG/5ML suspension 30 mL  30 mL Oral Q4H PRN Mannie Jerel PARAS, NP       haloperidol  (HALDOL ) tablet 5 mg  5 mg Oral TID PRN Mannie Jerel PARAS, NP       And   diphenhydrAMINE  (BENADRYL ) capsule 50 mg  50 mg Oral TID PRN Mannie Jerel PARAS, NP       haloperidol  lactate (HALDOL ) injection 5 mg  5 mg Intramuscular TID PRN Mannie Jerel PARAS, NP       And   diphenhydrAMINE  (BENADRYL ) injection 50 mg  50 mg Intramuscular TID PRN Mannie Jerel PARAS, NP       And   LORazepam  (ATIVAN ) injection 2 mg  2 mg Intramuscular TID PRN Mannie Jerel PARAS, NP       haloperidol  lactate (HALDOL ) injection 10 mg  10 mg Intramuscular TID PRN Mannie Jerel PARAS, NP       And   diphenhydrAMINE  (BENADRYL ) injection 50 mg  50 mg Intramuscular TID PRN Mannie Jerel PARAS, NP       And   LORazepam  (ATIVAN ) injection 2 mg  2 mg Intramuscular TID PRN Mannie Jerel PARAS, NP       hydrOXYzine  (ATARAX ) tablet 25 mg  25 mg Oral TID PRN Randall Starlyn HERO, NP   25 mg at 01/21/24 0910   lamoTRIgine (LAMICTAL) tablet 25 mg  25 mg Oral QHS Bennett, Christal H, NP   25 mg at 01/20/24 2041   magnesium  hydroxide (MILK OF MAGNESIA) suspension 30 mL  30 mL Oral Daily PRN Mannie Jerel PARAS, NP       mirtazapine (REMERON) tablet 15 mg  15 mg Oral QHS Bennett, Christal H, NP   15 mg at 01/20/24 2041   nicotine  (NICODERM  CQ - dosed in mg/24 hours) patch 21 mg  21 mg Transdermal Q0600 Mannie Jerel PARAS, NP   21 mg at 01/18/24 1638   phenazopyridine  (PYRIDIUM ) tablet 100 mg  100 mg Oral TID WC PRN Prentis Kitchens A, DO   100 mg at 01/21/24 9088   pneumococcal 20-valent conjugate vaccine (PREVNAR 20 ) injection 0.5 mL  0.5 mL Intramuscular Tomorrow-1000 Pashayan, Alexander S, DO       sulfamethoxazole-trimethoprim (BACTRIM DS) 800-160 MG per tablet 1 tablet  1 tablet Oral Q12H Bennett, Christal H, NP   1 tablet at 01/21/24 0900   PTA Medications: Medications Prior to Admission  Medication Sig Dispense Refill Last Dose/Taking   albuterol  (VENTOLIN  HFA) 108 (90 Base) MCG/ACT inhaler Inhale 2 puffs into the lungs every 4 (four) hours as needed for wheezing or shortness of breath. (Patient not taking: Reported on 01/18/2024) 1 each 0    atorvastatin (LIPITOR) 20 MG tablet Take 20 mg by mouth at bedtime. (Patient not taking: Reported on 01/18/2024)      busPIRone (BUSPAR) 15 MG tablet Take 15 mg by mouth 2 (two) times daily. (Patient not taking: Reported on 01/18/2024)  ciprofloxacin  (CIPRO ) 500 MG tablet Take 1 tablet (500 mg total) by mouth every 12 (twelve) hours. 10 tablet 0    clonazePAM (KLONOPIN) 0.5 MG tablet Take 0.5 mg by mouth 2 (two) times daily as needed for anxiety. (Patient not taking: Reported on 01/18/2024)      cyproheptadine (PERIACTIN) 4 MG tablet Take 4 mg by mouth 2 (two) times daily. (Patient not taking: Reported on 01/18/2024)      escitalopram (LEXAPRO) 20 MG tablet Take 20 mg by mouth at bedtime. (Patient not taking: Reported on 01/18/2024)      lamoTRIgine (LAMICTAL) 25 MG tablet Take 50 mg by mouth at bedtime. (Patient not taking: Reported on 01/18/2024)      lidocaine  (LIDODERM ) 5 % Place 1 patch onto the skin daily. Remove & Discard patch within 12 hours or as directed by MD (Patient not taking: Reported on 01/18/2024) 30 patch 0    OLANZapine (ZYPREXA) 10 MG tablet Take 10 mg by mouth at bedtime.  (Patient not taking: Reported on 01/18/2024)      omeprazole (PRILOSEC) 20 MG capsule Take 20 mg by mouth every morning. (Patient not taking: Reported on 01/18/2024)       Patient Stressors: Financial difficulties   Substance abuse    Patient Strengths: Capable of independent living  General fund of knowledge   Treatment Modalities: Medication Management, Group therapy, Case management,  1 to 1 session with clinician, Psychoeducation, Recreational therapy.   Physician Treatment Plan for Primary Diagnosis: MDD (major depressive disorder), recurrent severe, without psychosis (HCC) Long Term Goal(s):     Short Term Goals:    Medication Management: Evaluate patient's response, side effects, and tolerance of medication regimen.  Therapeutic Interventions: 1 to 1 sessions, Unit Group sessions and Medication administration.  Evaluation of Outcomes: Not Progressing  Physician Treatment Plan for Secondary Diagnosis: Principal Problem:   MDD (major depressive disorder), recurrent severe, without psychosis (HCC)  Long Term Goal(s):     Short Term Goals:       Medication Management: Evaluate patient's response, side effects, and tolerance of medication regimen.  Therapeutic Interventions: 1 to 1 sessions, Unit Group sessions and Medication administration.  Evaluation of Outcomes: Not Progressing   RN Treatment Plan for Primary Diagnosis: MDD (major depressive disorder), recurrent severe, without psychosis (HCC) Long Term Goal(s): Knowledge of disease and therapeutic regimen to maintain health will improve  Short Term Goals: Ability to remain free from injury will improve, Ability to verbalize frustration and anger appropriately will improve, Ability to demonstrate self-control, Ability to participate in decision making will improve, and Ability to identify and develop effective coping behaviors will improve  Medication Management: RN will administer medications as ordered by provider,  will assess and evaluate patient's response and provide education to patient for prescribed medication. RN will report any adverse and/or side effects to prescribing provider.  Therapeutic Interventions: 1 on 1 counseling sessions, Psychoeducation, Medication administration, Evaluate responses to treatment, Monitor vital signs and CBGs as ordered, Perform/monitor CIWA, COWS, AIMS and Fall Risk screenings as ordered, Perform wound care treatments as ordered.  Evaluation of Outcomes: Not Progressing   LCSW Treatment Plan for Primary Diagnosis: MDD (major depressive disorder), recurrent severe, without psychosis (HCC) Long Term Goal(s): Safe transition to appropriate next level of care at discharge, Engage patient in therapeutic group addressing interpersonal concerns.  Short Term Goals: Engage patient in aftercare planning with referrals and resources, Increase social support, Increase ability to appropriately verbalize feelings, Increase emotional regulation, and Facilitate acceptance of  mental health diagnosis and concerns  Therapeutic Interventions: Assess for all discharge needs, 1 to 1 time with Social worker, Explore available resources and support systems, Assess for adequacy in community support network, Educate family and significant other(s) on suicide prevention, Complete Psychosocial Assessment, Interpersonal group therapy.  Evaluation of Outcomes: Not Progressing   Progress in Treatment: Attending groups: No. Participating in groups: No. Taking medication as prescribed: Yes. Toleration medication: Yes. Family/Significant other contact made: Yes, individual(s) contacted:  Ricardo Sar (friend) [phone]  Patient understands diagnosis: Yes. Discussing patient identified problems/goals with staff: Yes. Medical problems stabilized or resolved: Yes. Denies suicidal/homicidal ideation: Yes Issues/concerns per patient self-inventory: No.  Patient Goals:  to get out of here  and get back to the money  Discharge Plan or Barriers: Patient likely to discharge to friend's house once stable.   Reason for Continuation of Hospitalization: Depression Medication stabilization  Estimated Length of Stay: 3-5 days  Last 3 [place] Suicide Severity Risk Score: Flowsheet Row Ad[facility] (Current) [facility] 01/17/2024 in [facility] from [facility]/16/2024 in [facility] RISK CATEGORY No Risk No Risk No Risk    Last PHQ 2/9 Scores:    08/23/2021    9:19 AM  Depression screen PHQ 2/9  Decreased Interest 1  Down, Depressed, Hopeless 1  PHQ - 2 Score 2  Altered sleeping 0  Tired, decreased energy 3  Change in appetite 1  Feeling bad or failure about yourself  1  Trouble concentrating 0  Moving slowly or fidgety/restless 1  Suicidal thoughts 0  PHQ-9 Score 8    Scribe for Treatment Team: Leyton Magoon M Ireoluwa Grant, LCSWA 01/21/2024 3:44 PM

## 2024-01-21 NOTE — Group Note (Signed)
 Date:  01/21/2024 Time:  10:57 AM  Group Topic/Focus:  Goals Group:   The focus of this group is to help patients establish daily goals to achieve during treatment and discuss how the patient can incorporate goal setting into their daily lives to aide in recovery.    Participation Level:  Did Not Attend  Participation Quality:  Did Not Attend  Affect:  Did Not Attend  Cognitive:  Did Not Attend  Insight: None  Engagement in Group:  Did Not Attend  Modes of Intervention:  Did Not Attend  Additional Comments:  Did Not Attend  Avelina DELENA Humphreys 01/21/2024, 10:57 AM

## 2024-01-21 NOTE — Progress Notes (Signed)
 Patient stated she usually takes a pill at home for indigestion.

## 2024-01-22 LAB — HEMOGLOBIN A1C
Hgb A1c MFr Bld: 5.2 % (ref 4.8–5.6)
Mean Plasma Glucose: 102.54 mg/dL

## 2024-01-22 LAB — LIPID PANEL
Cholesterol: 218 mg/dL — ABNORMAL HIGH (ref 0–200)
HDL: 54 mg/dL (ref >40)
LDL Cholesterol: 123 mg/dL — ABNORMAL HIGH (ref 0–99)
Total CHOL/HDL Ratio: 4 ratio
Triglycerides: 207 mg/dL — ABNORMAL HIGH (ref <150)
VLDL: 41 mg/dL — ABNORMAL HIGH (ref 0–40)

## 2024-01-22 NOTE — Progress Notes (Signed)
 Tour of Duty:  Prentice JINNY Angle, RN, 01/22/24, Tour of Duty: 0700-1900  SI/HI/AVH: Denies  Self-Reported   Mood: Negative  Anxiety: Denies, but Observable Depression: Denies Irritability: Denies  Broset  Violence Prevention Guidelines *See Row Information*: Moderate Violence Risk interventions implemented   LBM  Last BM Date : 01/21/24   Pain: not present  Patient Refusals (including Rx): No  >>Shift Summary: Patient observed to be anxious on unit. Patient able to make needs known. Patient observed to engage appropriately with staff and peers. Patient taking medications as prescribed. This shift, no PRN medication requested or required. No reported or observed side effects to medication. No reported or observed agitation, aggression, or other acute emotional distress. No reported or observed physical abnormalities or concerns.   Last Vitals  Vitals Weight: 64.1 kg Temp: 98.1 F (36.7 C) Temp Source: Oral Pulse Rate: 89 Resp: 16 BP: 112/84 Patient Position: (not recorded)  Admission Type  Psych Admission Type (Psych Patients Only) Admission Status: Involuntary Date 72 hour document signed : (not recorded) Time 72 hour document signed : (not recorded) Provider Notified (First and Last Name) (see details for LINK to note): (not recorded)   Psychosocial Assessment  Psychosocial Assessment Patient Complaints: Anxiety Eye Contact: Fair Facial Expression: Anxious Affect: Preoccupied Speech: Rapid Interaction: Needy Motor Activity: Other (Comment) (WDL) Appearance/Hygiene: Unremarkable Behavior Characteristics: Anxious, Cooperative Mood: Anxious   Aggressive Behavior  Targets: (not recorded)   Thought Process  Thought Process Coherency: Within Defined Limits Content: Within Defined Limits Delusions: None reported or observed Perception: Within Defined Limits Hallucination: None reported or observed Judgment: Poor Confusion: None  Danger to  Self/Others  Danger to Self Current suicidal ideation?: Denies Description of Suicide Plan: (not recorded) Self-Injurious Behavior: (not recorded) Agreement Not to Harm Self: (not recorded) Description of Agreement: (not recorded) Danger to Others: None reported or observed

## 2024-01-22 NOTE — Group Note (Signed)
 Date:  01/22/2024 Time:  9:19 AM  Group Topic/Focus:  Goals Group:   The focus of this group is to help patients establish daily goals to achieve during treatment and discuss how the patient can incorporate goal setting into their daily lives to aide in recovery.    Participation Level:  Did Not Attend  Leightyn Cina R Josie Burleigh 01/22/2024, 9:19 AM

## 2024-01-22 NOTE — Progress Notes (Signed)
 [facility] MD Progress Note  01/22/2024 10:16 PM Katie Pope  MRN:  [id number]  Principal Problem: MDD (major depressive disorder), recurrent severe, without psychosis (HCC) Diagnosis: Principal Problem:   MDD (major depressive disorder), recurrent severe, without psychosis (HCC)  Total Time spent with patient: 45 minutes   Reason for Admission:  Katie Pope. Dohn is a 43 year-old-female with a psychiatric history significant for bipolar I disorder, depression, and anxiety, admitted to the [facility] under involuntary commitment from [facility] ED. She presented to the ED with complaints of feeling "floppy," weak, and experiencing low energy throughout the day, shortly after starting ciprofloxacin  for a urinary tract infection. During evaluation, she reportedly disclosed to the ED physician that she had posted several threats of self-harm on Facebook the previous night and showed the posts on her phone. Based on safety concerns, she was placed under involuntary commitment. Urine drug screen was positive for cocaine and THC. Medical history is significant for bronchitis, dyslipidemia, scoliosis, and asthma.   Per IVC Documentation Worsening depression with suicidal ideation.  Making threats against own life on facebook. Worsening depression with suicidal ideation.  Not on meds.  Threats to own life on facebook.   24-hour Chart Review: Chart reviewed. Patient's case discussed in interdisciplinary team meeting.  Vital signs reviewed without critical value. As needed hydroxyzine  required overnight.  She slept a documented 9.25 hours.  She is adherent to taking psychotropic medication regimen. Per nursing notes: Pt is increasingly agitated.    Daily Evaluation: Kerri-Anne reports that her mood is euthymic, improved since admission, and stable. Denies feeling down, depressed, or sad. Reports that anxiety symptoms are at manageable level. Sleep and appetite reported as stable.  Concentration is without complaint. Reports energy level is adequate. Denies having any suicidal thoughts. Denies having any suicidal intent and plan. Denies having any homicidal ideation. Denies having psychotic symptoms. Denies having side effects to current psychiatric medications.  Kamaiya states that she feels "ready for the world." She anticipates needing transportation at discharge and believes her friend may be able to pick her up. She reports that her medications have been helpful. She mentions she has been engaging with peers and feeling better. She shares that her 44 year old daughter called, and she missed the message yesterday; states she felt guilty and upset this morning because she figured her daughter must have been worried. She identifies both her daughter and son as protective factors in her life.  Discussed discharge planning and emphasized medication adherence and attendance at follow-up appointments to prevent symptom exacerbation.   We discussed psychosocial stressors, including lack of housing. Discussed with the patient the critical importance of complete cessation of substances to support recovery, highlighting that abstaining can lead to improve mood stability, enhance cognitive function, better sleep and lower anxiety.   [name] presents with a noticeably brighter affect, smiling appropriately and engaging easily in conversation. Her mood appears improved and stable. There are no overt psychotic symptoms or manic features evident. She is tolerating her medications well. Since admission, she has not required the use of an agitation protocol.  Case reviewed with the attending psychiatrist.  Continue her current psychiatric medication regimen.  Anticipate discharge on Wednesday, October 1, contingent on continued clinical stability. Patient's friend, Ricardo, to pick-up patient at discharge. Appreciate social work coordinating outpatient aftercare appointments.  Past Psychiatric  History: See H&P   Past Medical History:  Past Medical History:  Diagnosis Date   Anxiety    Asthma    Bipolar  1 disorder (HCC)    Bronchitis    Depression    Dyslipidemia    GERD (gastroesophageal reflux disease)    Scoliosis     Past Surgical History:  Procedure Laterality Date   CARPAL TUNNEL RELEASE Right 11/08/2022   Procedure: EXTENSILE CARPAL TUNNEL RELEASE;  Surgeon: Romona Harari, MD;  Location: [facility];  Service: Orthopedics;  Laterality: Right;  regional 60   FACIAL FRACTURE SURGERY Left    TENOSYNOVECTOMY Right 11/08/2022   Procedure: POSSIBLE TENOSYNOVECTOMY;  Surgeon: Romona Harari, MD;  Location: [facility];  Service: Orthopedics;  Laterality: Right;   Family History:  Family History  Problem Relation Age of Onset   Cancer Paternal Grandfather    Dementia Paternal Grandmother    Diabetes Mother    Family Psychiatric  History: See H&P  Social History:  Social History   Substance and Sexual Activity  Alcohol Use Not Currently   Comment: occ     Social History   Substance and Sexual Activity  Drug Use Yes   Types: Marijuana   Comment: last use 10/30/2022    Social History   Socioeconomic History   Marital status: Single    Spouse name: Not on file   Number of children: Not on file   Years of education: Not on file   Highest education level: Not on file  Occupational History   Not on file  Tobacco Use   Smoking status: Some Days    Current packs/day: 0.50    Average packs/day: 0.5 packs/day for 0.7 years (0.4 ttl pk-yrs)    Types: Cigarettes    Start date: 2025    Last attempt to quit: 12/23/2011   Smokeless tobacco: Former  Building services engineer status: Never Used  Substance and Sexual Activity   Alcohol use: Not Currently    Comment: occ   Drug use: Yes    Types: Marijuana    Comment: last use 10/30/2022   Sexual activity: Not Currently    Birth control/protection: I.U.D.  Other Topics Concern    Not on file  Social History Narrative   Lives with 6 year old daughter and 3 year old son.     Social Drivers of Health   Financial Resource Strain: Not at Risk (06/15/2022)   Received from [employer]    Financial Resource Strain: 1  Food Insecurity: No Food Insecurity (01/18/2024)   Hunger Vital Sign    Worried About Running Out of Food in the Last Year: Never true    Ran Out of Food in the Last Year: Never true  Transportation Needs: No Transportation Needs (01/18/2024)   PRAPARE - Administrator, Civil Service (Medical): No    Lack of Transportation (Non-Medical): No  Physical Activity: Not on File (05/26/2022)   Received from [facility]   Physical Activity    Physical Activity: 0  Stress: Not on File (05/26/2022)   Received from [facility]   Stress    Stress: 0  Social Connections: Not on File (12/26/2022)   Received from [facility]   Social Connections    Connectedness: 0   Additional Social History:                         Current Medications: Current Facility-Administered Medications  Medication Dose Route Frequency Provider Last Rate Last Admin   acetaminophen  (TYLENOL ) tablet 650 mg  650 mg Oral Q6H PRN Mannie Larve  J, NP   650 mg at 01/21/24 1806   alum & mag hydroxide-simeth (MAALOX/MYLANTA) 200-200-20 MG/5ML suspension 30 mL  30 mL Oral Q4H PRN Mannie Jerel PARAS, NP       haloperidol  (HALDOL ) tablet 5 mg  5 mg Oral TID PRN Mannie Jerel PARAS, NP       And   diphenhydrAMINE  (BENADRYL ) capsule 50 mg  50 mg Oral TID PRN Mannie Jerel PARAS, NP       haloperidol  lactate (HALDOL ) injection 5 mg  5 mg Intramuscular TID PRN Mannie Jerel PARAS, NP       And   diphenhydrAMINE  (BENADRYL ) injection 50 mg  50 mg Intramuscular TID PRN Mannie Jerel PARAS, NP       And   LORazepam  (ATIVAN ) injection 2 mg  2 mg Intramuscular TID PRN Mannie Jerel PARAS, NP       haloperidol  lactate (HALDOL ) injection 10 mg  10 mg Intramuscular TID PRN Mannie Jerel PARAS, NP        And   diphenhydrAMINE  (BENADRYL ) injection 50 mg  50 mg Intramuscular TID PRN Mannie Jerel PARAS, NP       And   LORazepam  (ATIVAN ) injection 2 mg  2 mg Intramuscular TID PRN Mannie Jerel PARAS, NP       hydrOXYzine  (ATARAX ) tablet 25 mg  25 mg Oral TID PRN Randall Starlyn HERO, NP   25 mg at 01/22/24 2107   lamoTRIgine (LAMICTAL) tablet 25 mg  25 mg Oral QHS Shenna Brissette H, NP   25 mg at 01/22/24 2107   magnesium  hydroxide (MILK OF MAGNESIA) suspension 30 mL  30 mL Oral Daily PRN Mannie Jerel PARAS, NP       mirtazapine (REMERON) tablet 15 mg  15 mg Oral QHS Evita Merida H, NP   15 mg at 01/22/24 2107   nicotine  (NICODERM CQ  - dosed in mg/24 hours) patch 21 mg  21 mg Transdermal Q0600 Mannie Jerel PARAS, NP   21 mg at 01/18/24 1638   pantoprazole  (PROTONIX ) EC tablet 40 mg  40 mg Oral Daily Kelso Bibby H, NP   40 mg at 01/22/24 0816   phenazopyridine  (PYRIDIUM ) tablet 100 mg  100 mg Oral TID WC PRN Prentis Kitchens A, DO   100 mg at 01/21/24 9088   pneumococcal 20-valent conjugate vaccine (PREVNAR 20 ) injection 0.5 mL  0.5 mL Intramuscular Tomorrow-1000 Raliegh Marsa RAMAN, DO        Lab Results:  Results for orders placed or performed during the hospital encounter of 01/18/24 (from the past 48 hours)  Lipid panel     Status: Abnormal   Collection Time: 01/22/24  6:18 AM  Result Value Ref Range   Cholesterol 218 (H) 0 - 200 mg/dL    Comment:        ATP III CLASSIFICATION:  <200     mg/dL   Desirable  799-760  mg/dL   Borderline High  >=759    mg/dL   High           Triglycerides 207 (H) <150 mg/dL   HDL 54 >59 mg/dL   Total CHOL/HDL Ratio 4.0 RATIO   VLDL 41 (H) 0 - 40 mg/dL   LDL Cholesterol 876 (H) 0 - 99 mg/dL    Comment:        Total Cholesterol/HDL:CHD Risk Coronary Heart Disease Risk Table                     Men  Women  1/2 Average Risk   3.4   3.3  Average Risk       5.0   4.4  2 X Average Risk   9.6   7.1  3 X Average Risk  23.4   11.0        Use the  calculated Patient Ratio above and the CHD Risk Table to determine the patient's CHD Risk.        ATP III CLASSIFICATION (LDL):  <100     mg/dL   Optimal  899-870  mg/dL   Near or Above                    Optimal  130-159  mg/dL   Borderline  839-810  mg/dL   High  >809     mg/dL   Very High Performed at [facility], [address] [place], [place] [address]   Hemoglobin A1c     Status: None   Collection Time: 01/22/24  6:16 PM  Result Value Ref Range   Hgb A1c MFr Bld 5.2 4.8 - 5.6 %    Comment: (NOTE) Diagnosis of Diabetes The following HbA1c ranges recommended by the American Diabetes Association (ADA) may be used as an aid in the diagnosis of diabetes mellitus.  Hemoglobin             Suggested A1C NGSP%              Diagnosis  <5.7                   Non Diabetic  5.7-6.4                Pre-Diabetic  >6.4                   Diabetic  <7.0                   Glycemic control for                       adults with diabetes.     Mean Plasma Glucose 102.54 mg/dL    Comment: Performed at [facility], [address] [place], [place] [address]    Blood Alcohol level:  Lab Results  Component Value Date   [facility] <15 01/17/2024    Metabolic Disorder Labs: Lab Results  Component Value Date   HGBA1C 5.2 01/22/2024   MPG 102.54 01/22/2024   No results found for: PROLACTIN Lab Results  Component Value Date   CHOL 218 (H) 01/22/2024   TRIG 207 (H) 01/22/2024   HDL 54 01/22/2024   CHOLHDL 4.0 01/22/2024   VLDL 41 (H) 01/22/2024   LDLCALC 123 (H) 01/22/2024   LDLCALC 69 03/14/2023    Physical Findings: AIMS:  ,  ,  ,  ,  ,  ,   CIWA:    COWS:     Musculoskeletal: Strength & Muscle Tone: within normal limits Gait & Station: normal Patient leans: N/A  Psychiatric Specialty Exam:  Presentation  General Appearance:  Casual  Eye Contact: Fair  Speech: Clear and Coherent  Speech Volume: Normal  Handedness: Right   Mood  and Affect  Mood: Anxious; Irritable  Affect: Congruent   Thought Process  Thought Processes: Coherent  Descriptions of Associations:Intact  Orientation:Full (Time, Place and Person)  Thought Content:Rumination (Ruminating on discharge)  History of Schizophrenia/Schizoaffective disorder:No  Duration of Psychotic Symptoms:No data  recorded Hallucinations:No data recorded  Ideas of Reference:None  Suicidal Thoughts:No data recorded  Homicidal Thoughts:No data recorded   Sensorium  Memory: Immediate Good; Recent Good  Judgment: Poor  Insight: Lacking   Executive Functions  Concentration: Fair  Attention Span: Fair  Recall: Good  Fund of Knowledge: Fair  Language: Fair   Psychomotor Activity  Psychomotor Activity: No data recorded   Assets  Assets: Physical Health; Talents/Skills   Sleep  Sleep: No data recorded    Physical Exam: Physical Exam Vitals and nursing note reviewed.  Constitutional:      General: She is not in acute distress.    Appearance: She is not ill-appearing.  HENT:     Mouth/Throat:     Pharynx: Oropharynx is clear.  Cardiovascular:     Pulses: Normal pulses.  Pulmonary:     Effort: No respiratory distress.  Skin:    General: Skin is dry.  Neurological:     General: No focal deficit present.     Mental Status: She is alert and oriented to person, place, and time.    Review of Systems  Constitutional: Negative.   HENT: Negative.    Respiratory: Negative.    Cardiovascular: Negative.   Gastrointestinal: Negative.   Genitourinary:  Positive for dysuria.  Skin: Negative.   Neurological: Negative.   Psychiatric/Behavioral:  Positive for depression and substance abuse. Negative for hallucinations, memory loss and suicidal ideas. The patient is nervous/anxious.    Blood pressure 112/84, pulse 89, temperature 98.1 F (36.7 C), temperature source Oral, resp. rate 16, height 5' 5 (1.651 m), weight 64.1 kg,  last menstrual period 01/14/2024, SpO2 100%, unknown if currently breastfeeding. Body mass index is 23.53 kg/m.   Treatment Plan Summary: Daily contact with patient to assess and evaluate symptoms and progress in treatment and Medication management  # Bipolar, mixed  # GAD - Continue Lamictal at 25 mg daily at bedtime - Continue Remeron 15 mg daily at bedtime, appetite stimulation   - Hydroxyzine  25 mg 3 times daily as needed, anxiety - Trazodone 50 mg at bedtime as needed, sleep  - BH Haldol  agitation protocol (see MAR)   Medical # GERD  - Continue Protonix  EC 40 mg daily    # UTI  - Continue Bactrim DS 800-160 mg every 12 hours x 3 days 01/19/2024    Observation Level/Precautions:  15 minute checks  Laboratory:  UDS - + Cocaine, Tetrahydrocannabinol   Urine Culture 01/14/2024 - >=100,000 COLONIES/mL ESCHERICHIA COLI    New lab orders  Lipid panel, hemoglobin A1c   EKG - Normal sinus rhythm  QT QTc 380/416    Psychotherapy: Group therapies  Medications: As listed above  Consultations: As needed  Discharge Concerns: Safety, medication compliance  Estimated LOS: 4 to 7 days  Other:       Safety and Monitoring:             --  Involuntary admission to inpatient psychiatric unit for safety, stabilization and treatment             -- Daily contact with patient to assess and evaluate symptoms and progress in treatment             -- Patient's case to be discussed in multi-disciplinary team meeting             -- Precautions: suicide, elopement, and assault              --  The risks/benefits/side-effects/alternatives to this medication were  discussed in detail with the patient and time was given for questions. The patient consents to medication trial.  -- FDA             -- Metabolic profile and EKG monitoring obtained while on an atypical antipsychotic (BMI: Lipid Panel: HbgA1c: QTc:)              -- Encouraged patient to participate in unit milieu and in scheduled group  therapies              -- Short Term Goals: Ability to identify changes in lifestyle to reduce recurrence of condition will improve             -- Long Term Goals: Improvement in symptoms so as ready for discharge                Discharge Planning:              -- Social work and case management to assist with discharge planning and identification of hospital follow-up needs prior to discharge             -- Estimated LOS: 4-7 days             -- Discharge Concerns: Need to establish a safety plan; Medication compliance and effectiveness             -- Discharge Goals: Return home with outpatient referrals for mental health follow-up including medication management/psychotherapy      I certify that inpatient services furnished can reasonably be expected to improve the patient's conditio    Blair Chiquita Hint, NP 01/22/2024, 10:16 PM Patient ID: Delon JONETTA Haley, female   DOB: 1980-08-28, 43 y.o.   MRN: [id number] Patient ID: PEBBLE BOTKIN, female   DOB: 02-28-1981, 43 y.o.   MRN: [id number]

## 2024-01-22 NOTE — [facility] Group Notes (Signed)
 [facility] Group Notes:  (Nursing/MHT/Case Management/Adjunct)  Date:  01/22/2024  Time:  11:54 PM  Type of Therapy:  Psychoeducational Skills  Participation Level:  Active  Participation Quality:  Inattentive and Redirectable  Affect:  Angry  Cognitive:  Lacking  Insight:  Lacking  Engagement in Group:  Resistant  Modes of Intervention:  Education  Summary of Progress/Problems: Patient states that she became angry this morning when she found out that the staff didn't pass along to her that she had telephone messages. She indicated that she missed five phone calls yesterday and that her daughter stayed awake all of last night just waiting for her to return her calls. Her goal for today was to not get angry and was unsuccessful with that. She anticipates being discharged tomorrow.   Katie Pope S 01/22/2024, 11:54 PM

## 2024-01-22 NOTE — Group Note (Signed)
 LCSW Group Therapy Note   Group Date: 01/22/2024 Start Time: 1100 End Time: 1200   Participation:  did not attend  Type of Therapy:  Group Therapy  Topic:  Healing of the Hearts:  A Safe Space for Grief   Objective:  The objective of this class, Healing Hearts: A Safe Space for Grief, is to create a compassionate environment where participants can process their grief, explore different stages of grief, and discover ways to honor their loved ones through personal rituals.  Goals: Provide a safe and supportive space where participants feel comfortable sharing their feelings and experiences of grief without judgment. Educate participants about the stages of grief and emphasize that there is no right way to grieve or a fixed timeline for healing. Introduce the concept of rituals as a means to process grief, allowing individuals to honor their loved ones in a personal and meaningful way.  Summary:  In Healing Hearts: A Safe Space for Grief, we explored the unique and personal journey of grief, emphasizing that everyone experiences it differently. We discussed the five stages of grief (denial, anger, bargaining, depression, and acceptance), with the understanding that grief is not linear. Rituals were introduced as a way to help cope with loss, offering comfort and connection through meaningful actions such as lighting candles or taking memory walks. Participants were encouraged to express their emotions, focus on self-care, and reflect on moments of gratitude for their loved ones, recognizing that healing is a process and there is no timeline for grief.  Therapeutic Modalities: - Elements of CBT:  Cognitive Restructuring - Elements of DBT:  Distress Tolerance, Emotion Regulation   Denice Cardon O Shaquille Murdy, LCSWA 01/22/2024  12:35 PM

## 2024-01-22 NOTE — BH Assessment (Signed)
(  Sleep Hours) - 9.25 (Any PRNs that were needed, meds refused, or side effects to meds)-  (Any disturbances and when (visitation, over night)- None (Concerns raised by the patient)- Pt is increasingly agitated. (SI/HI/AVH)- Denies

## 2024-01-22 NOTE — Plan of Care (Signed)
   Problem: Education: Goal: Emotional status will improve Outcome: Progressing Goal: Mental status will improve Outcome: Progressing

## 2024-01-22 NOTE — Progress Notes (Incomplete)
 [facility] MD Progress Note  01/22/2024 10:16 PM Katie Pope  MRN:  [id number]  Principal Problem: MDD (major depressive disorder), recurrent severe, without psychosis (HCC) Diagnosis: Principal Problem:   MDD (major depressive disorder), recurrent severe, without psychosis (HCC)  Total Time spent with patient: 45 minutes   Reason for Admission:  Katie Pope is a 43 year-old-female with a psychiatric history significant for bipolar I disorder, depression, and anxiety, admitted to the [facility] under involuntary commitment from [facility] ED. She presented to the ED with complaints of feeling "floppy," weak, and experiencing low energy throughout the day, shortly after starting ciprofloxacin  for a urinary tract infection. During evaluation, she reportedly disclosed to the ED physician that she had posted several threats of self-harm on Facebook the previous night and showed the posts on her phone. Based on safety concerns, she was placed under involuntary commitment. Urine drug screen was positive for cocaine and THC. Medical history is significant for bronchitis, dyslipidemia, scoliosis, and asthma.   Per IVC Documentation Worsening depression with suicidal ideation.  Making threats against own life on facebook. Worsening depression with suicidal ideation.  Not on meds.  Threats to own life on facebook.   24-hour Chart Review: Chart reviewed. Patient's case discussed in interdisciplinary team meeting.  Vital signs reviewed without critical value. As needed hydroxyzine  required overnight.  She slept a documented 9.25 hours.  She is adherent to taking psychotropic medication regimen. Per nursing notes: Pt is increasingly agitated.    Daily Evaluation: Katie Pope reports that her mood is euthymic, improved since admission, and stable. Denies feeling down, depressed, or sad. Reports that anxiety symptoms are at manageable level. Sleep and appetite reported as stable.  Concentration is without complaint. Reports energy level is adequate. Denies having any suicidal thoughts. Denies having any suicidal intent and plan. Denies having any homicidal ideation. Denies having psychotic symptoms. Denies having side effects to current psychiatric medications.   Discussed discharge planning and emphasized medication adherence and attendance at follow-up appointments to prevent symptom exacerbation.   We discussed psychosocial stressors, including financial strain and impact of losing his moped to theft (his primary transportation to work).  Discussed with the patient the critical importance of complete cessation of alcohol and other substances to support recovery, highlighting that abstaining can lead to improve mood stability, enhance cognitive function, better sleep, lower anxiety, and reduced long-term physical health risks such as cardiovascular disease.   Overall, the patient's mood has improved and remains stable. He reports no suicidal or homicidal ideation, intent, or psychotic symptoms.  He continues to endorse akathisia, and propranolol has been withheld following his recent intentional overdose.  His Abilify dosage has also been reduced to help mitigate this side effect.  The case was discussed with the attending psychiatrist.  Plan to continue current treatment regimen.   Discharge is anticipated on Sunday, October 14, 2023, pending continued stability.      Katie Pope appears more engaged and less irritable compared to previous interactions. She is compliant with her medication regimen and has not required the use of the agitation protocol. Nursing notes indicate that she reported she normally takes a pill at home for indigestion. A chart review reveals a prescription for omeprazole 20 mg daily. Overall, she demonstrates improved mood and behavior, with no new psychiatric or medical concerns.  Plan: Continue current psychiatric medications. Initiate pantoprazole  40 mg daily  to address gastrointestinal symptoms.  Anticipate discharge on Wednesday, October 1, pending continued clinical stability. Social work to  coordinate outpatient aftercare appointments, including follow-up with outpatient therapy and medication management services.    Past Psychiatric History: See H&P   Past Medical History:  Past Medical History:  Diagnosis Date  . Anxiety   . Asthma   . Bipolar 1 disorder (HCC)   . Bronchitis   . Depression   . Dyslipidemia   . GERD (gastroesophageal reflux disease)   . Scoliosis     Past Surgical History:  Procedure Laterality Date  . CARPAL TUNNEL RELEASE Right 11/08/2022   Procedure: EXTENSILE CARPAL TUNNEL RELEASE;  Surgeon: Romona Harari, MD;  Location: [facility];  Service: Orthopedics;  Laterality: Right;  regional 60  . FACIAL FRACTURE SURGERY Left   . TENOSYNOVECTOMY Right 11/08/2022   Procedure: POSSIBLE TENOSYNOVECTOMY;  Surgeon: Romona Harari, MD;  Location: [facility];  Service: Orthopedics;  Laterality: Right;   Family History:  Family History  Problem Relation Age of Onset  . Cancer Paternal Grandfather   . Dementia Paternal Grandmother   . Diabetes Mother    Family Psychiatric  History: See H&P  Social History:  Social History   Substance and Sexual Activity  Alcohol Use Not Currently   Comment: occ     Social History   Substance and Sexual Activity  Drug Use Yes  . Types: Marijuana   Comment: last use 10/30/2022    Social History   Socioeconomic History  . Marital status: Single    Spouse name: Not on file  . Number of children: Not on file  . Years of education: Not on file  . Highest education level: Not on file  Occupational History  . Not on file  Tobacco Use  . Smoking status: Some Days    Current packs/day: 0.50    Average packs/day: 0.5 packs/day for 0.7 years (0.4 ttl pk-yrs)    Types: Cigarettes    Start date: 2025    Last attempt to quit: 12/23/2011  .  Smokeless tobacco: Former  Advertising account planner  . Vaping status: Never Used  Substance and Sexual Activity  . Alcohol use: Not Currently    Comment: occ  . Drug use: Yes    Types: Marijuana    Comment: last use 10/30/2022  . Sexual activity: Not Currently    Birth control/protection: I.U.D.  Other Topics Concern  . Not on file  Social History Narrative   Lives with 35 year old daughter and 64 year old son.     Social Drivers of Health   Financial Resource Strain: Not at Risk (06/15/2022)   Received from [employer]   . Financial Resource Strain: 1  Food Insecurity: No Food Insecurity (01/18/2024)   Hunger Vital Sign   . Worried About Programme researcher, broadcasting/film/video in the Last Year: Never true   . Ran Out of Food in the Last Year: Never true  Transportation Needs: No Transportation Needs (01/18/2024)   PRAPARE - Transportation   . Lack of Transportation (Medical): No   . Lack of Transportation (Non-Medical): No  Physical Activity: Not on File (05/26/2022)   Received from [facility]   Physical Activity   . Physical Activity: 0  Stress: Not on File (05/26/2022)   Received from [facility]   Stress   . Stress: 0  Social Connections: Not on File (12/26/2022)   Received from [employer]   . Connectedness: 0   Additional Social History:  Current Medications: Current Facility-Administered Medications  Medication Dose Route Frequency Provider Last Rate Last Admin  . acetaminophen  (TYLENOL ) tablet 650 mg  650 mg Oral Q6H PRN Mannie Jerel PARAS, NP   650 mg at 01/21/24 1806  . alum & mag hydroxide-simeth (MAALOX/MYLANTA) 200-200-20 MG/5ML suspension 30 mL  30 mL Oral Q4H PRN Mannie Jerel PARAS, NP      . haloperidol  (HALDOL ) tablet 5 mg  5 mg Oral TID PRN Mannie Jerel PARAS, NP       And  . diphenhydrAMINE  (BENADRYL ) capsule 50 mg  50 mg Oral TID PRN Mannie Jerel PARAS, NP      . haloperidol  lactate (HALDOL ) injection 5 mg  5 mg Intramuscular TID PRN  Mannie Jerel PARAS, NP       And  . diphenhydrAMINE  (BENADRYL ) injection 50 mg  50 mg Intramuscular TID PRN Mannie Jerel PARAS, NP       And  . LORazepam  (ATIVAN ) injection 2 mg  2 mg Intramuscular TID PRN Mannie Jerel PARAS, NP      . haloperidol  lactate (HALDOL ) injection 10 mg  10 mg Intramuscular TID PRN Mannie Jerel PARAS, NP       And  . diphenhydrAMINE  (BENADRYL ) injection 50 mg  50 mg Intramuscular TID PRN Mannie Jerel PARAS, NP       And  . LORazepam  (ATIVAN ) injection 2 mg  2 mg Intramuscular TID PRN Mannie Jerel PARAS, NP      . hydrOXYzine  (ATARAX ) tablet 25 mg  25 mg Oral TID PRN Randall Starlyn HERO, NP   25 mg at 01/22/24 2107  . lamoTRIgine (LAMICTAL) tablet 25 mg  25 mg Oral QHS Ka Bench H, NP   25 mg at 01/22/24 2107  . magnesium  hydroxide (MILK OF MAGNESIA) suspension 30 mL  30 mL Oral Daily PRN Mannie Jerel PARAS, NP      . mirtazapine (REMERON) tablet 15 mg  15 mg Oral QHS Janda Cargo H, NP   15 mg at 01/22/24 2107  . nicotine  (NICODERM CQ  - dosed in mg/24 hours) patch 21 mg  21 mg Transdermal Q0600 Mannie Jerel PARAS, NP   21 mg at 01/18/24 1638  . pantoprazole  (PROTONIX ) EC tablet 40 mg  40 mg Oral Daily Jemeka Wagler H, NP   40 mg at 01/22/24 0816  . phenazopyridine  (PYRIDIUM ) tablet 100 mg  100 mg Oral TID WC PRN Prentis Kitchens A, DO   100 mg at 01/21/24 0911  . pneumococcal 20-valent conjugate vaccine (PREVNAR 20 ) injection 0.5 mL  0.5 mL Intramuscular Tomorrow-1000 Pashayan, Marsa RAMAN, DO        Lab Results:  Results for orders placed or performed during the hospital encounter of 01/18/24 (from the past 48 hours)  Lipid panel     Status: Abnormal   Collection Time: 01/22/24  6:18 AM  Result Value Ref Range   Cholesterol 218 (H) 0 - 200 mg/dL    Comment:        ATP III CLASSIFICATION:  <200     mg/dL   Desirable  799-760  mg/dL   Borderline High  >=759    mg/dL   High           Triglycerides 207 (H) <150 mg/dL   HDL 54 >59 mg/dL   Total CHOL/HDL  Ratio 4.0 RATIO   VLDL 41 (H) 0 - 40 mg/dL   LDL Cholesterol 876 (H) 0 - 99 mg/dL    Comment:  Total Cholesterol/HDL:CHD Risk Coronary Heart Disease Risk Table                     Men   Women  1/2 Average Risk   3.4   3.3  Average Risk       5.0   4.4  2 X Average Risk   9.6   7.1  3 X Average Risk  23.4   11.0        Use the calculated Patient Ratio above and the CHD Risk Table to determine the patient's CHD Risk.        ATP III CLASSIFICATION (LDL):  <100     mg/dL   Optimal  899-870  mg/dL   Near or Above                    Optimal  130-159  mg/dL   Borderline  839-810  mg/dL   High  >809     mg/dL   Very High Performed at [facility], [address], [place], [place] [address]   Hemoglobin A1c     Status: None   Collection Time: 01/22/24  6:16 PM  Result Value Ref Range   Hgb A1c MFr Bld 5.2 4.8 - 5.6 %    Comment: (NOTE) Diagnosis of Diabetes The following HbA1c ranges recommended by the American Diabetes Association (ADA) may be used as an aid in the diagnosis of diabetes mellitus.  Hemoglobin             Suggested A1C NGSP%              Diagnosis  <5.7                   Non Diabetic  5.7-6.4                Pre-Diabetic  >6.4                   Diabetic  <7.0                   Glycemic control for                       adults with diabetes.     Mean Plasma Glucose 102.54 mg/dL    Comment: Performed at [facility], [address], [place], [place] [address]    Blood Alcohol level:  Lab Results  Component Value Date   [facility] <15 01/17/2024    Metabolic Disorder Labs: Lab Results  Component Value Date   HGBA1C 5.2 01/22/2024   MPG 102.54 01/22/2024   No results found for: PROLACTIN Lab Results  Component Value Date   CHOL 218 (H) 01/22/2024   TRIG 207 (H) 01/22/2024   HDL 54 01/22/2024   CHOLHDL 4.0 01/22/2024   VLDL 41 (H) 01/22/2024   LDLCALC 123 (H) 01/22/2024   LDLCALC 69 03/14/2023    Physical  Findings: AIMS:  ,  ,  ,  ,  ,  ,   CIWA:    COWS:     Musculoskeletal: Strength & Muscle Tone: within normal limits Gait & Station: normal Patient leans: N/A  Psychiatric Specialty Exam:  Presentation  General Appearance:  Casual  Eye Contact: Fair  Speech: Clear and Coherent  Speech Volume: Normal  Handedness: Right   Mood and Affect  Mood: Anxious; Irritable  Affect: Congruent   Thought Process  Thought Processes: Coherent  Descriptions of Associations:Intact  Orientation:Full (Time, Place and Person)  Thought Content:Rumination (Ruminating on discharge)  History of Schizophrenia/Schizoaffective disorder:No  Duration of Psychotic Symptoms:No data recorded Hallucinations:No data recorded  Ideas of Reference:None  Suicidal Thoughts:No data recorded  Homicidal Thoughts:No data recorded   Sensorium  Memory: Immediate Good; Recent Good  Judgment: Poor  Insight: Lacking   Executive Functions  Concentration: Fair  Attention Span: Fair  Recall: Good  Fund of Knowledge: Fair  Language: Fair   Psychomotor Activity  Psychomotor Activity: No data recorded   Assets  Assets: Physical Health; Talents/Skills   Sleep  Sleep: No data recorded    Physical Exam: Physical Exam Vitals and nursing note reviewed.  Constitutional:      General: She is not in acute distress.    Appearance: She is not ill-appearing.  HENT:     Mouth/Throat:     Pharynx: Oropharynx is clear.  Cardiovascular:     Pulses: Normal pulses.  Pulmonary:     Effort: No respiratory distress.  Skin:    General: Skin is dry.  Neurological:     General: No focal deficit present.     Mental Status: She is alert and oriented to person, place, and time.    Review of Systems  Constitutional: Negative.   HENT: Negative.    Respiratory: Negative.    Cardiovascular: Negative.   Gastrointestinal: Negative.   Genitourinary:  Positive for dysuria.   Skin: Negative.   Neurological: Negative.   Psychiatric/Behavioral:  Positive for depression and substance abuse. Negative for hallucinations, memory loss and suicidal ideas. The patient is nervous/anxious.    Blood pressure 112/84, pulse 89, temperature 98.1 F (36.7 C), temperature source Oral, resp. rate 16, height 5' 5 (1.651 m), weight 64.1 kg, last menstrual period 01/14/2024, SpO2 100%, unknown if currently breastfeeding. Body mass index is 23.53 kg/m.   Treatment Plan Summary: Daily contact with patient to assess and evaluate symptoms and progress in treatment and Medication management  # Bipolar, mixed  # GAD - Continue Lamictal at 25 mg daily at bedtime - Continue Remeron 15 mg daily at bedtime, appetite stimulation   - Hydroxyzine  25 mg 3 times daily as needed, anxiety - Trazodone 50 mg at bedtime as needed, sleep  - BH Haldol  agitation protocol (see MAR)   Medical # GERD  - Continue Protonix  EC 40 mg daily    # UTI  - Continue Bactrim DS 800-160 mg every 12 hours x 3 days 01/19/2024    Observation Level/Precautions:  15 minute checks  Laboratory:  UDS - + Cocaine, Tetrahydrocannabinol   Urine Culture 01/14/2024 - >=100,000 COLONIES/mL ESCHERICHIA COLI    New lab orders  Lipid panel, hemoglobin A1c   EKG - Normal sinus rhythm  QT QTc 380/416    Psychotherapy: Group therapies  Medications: As listed above  Consultations: As needed  Discharge Concerns: Safety, medication compliance  Estimated LOS: 4 to 7 days  Other:       Safety and Monitoring:             --  Involuntary admission to inpatient psychiatric unit for safety, stabilization and treatment             -- Daily contact with patient to assess and evaluate symptoms and progress in treatment             -- Patient's case to be discussed in multi-disciplinary team meeting             --  Precautions: suicide, elopement, and assault              --  The risks/benefits/side-effects/alternatives to  this medication were discussed in detail with the patient and time was given for questions. The patient consents to medication trial.  -- FDA             -- Metabolic profile and EKG monitoring obtained while on an atypical antipsychotic (BMI: Lipid Panel: HbgA1c: QTc:)              -- Encouraged patient to participate in unit milieu and in scheduled group therapies              -- Short Term Goals: Ability to identify changes in lifestyle to reduce recurrence of condition will improve             -- Long Term Goals: Improvement in symptoms so as ready for discharge                Discharge Planning:              -- Social work and case management to assist with discharge planning and identification of hospital follow-up needs prior to discharge             -- Estimated LOS: 4-7 days             -- Discharge Concerns: Need to establish a safety plan; Medication compliance and effectiveness             -- Discharge Goals: Return home with outpatient referrals for mental health follow-up including medication management/psychotherapy      I certify that inpatient services furnished can reasonably be expected to improve the patient's conditio    Blair Chiquita Hint, NP 01/22/2024, 10:16 PM Patient ID: Katie Pope, female   DOB: 15-Feb-1981, 43 y.o.   MRN: [id number] Patient ID: Katie Pope, female   DOB: May 18, 1980, 43 y.o.   MRN: [id number]

## 2024-01-22 NOTE — Plan of Care (Signed)
   Problem: Education: Goal: Knowledge of [facility] Education information/materials will improve Outcome: Progressing Goal: Emotional status will improve Outcome: Progressing Goal: Mental status will improve Outcome: Progressing Goal: Verbalization of understanding the information provided will improve Outcome: Progressing

## 2024-01-23 ENCOUNTER — Other Ambulatory Visit (HOSPITAL_COMMUNITY): Payer: Self-pay

## 2024-01-23 DIAGNOSIS — F332 Major depressive disorder, recurrent severe without psychotic features: Secondary | ICD-10-CM

## 2024-01-23 MED ORDER — LAMOTRIGINE 25 MG PO TABS
25.0000 mg | ORAL_TABLET | Freq: Every day | ORAL | 0 refills | Status: AC
  Filled 2024-01-23: qty 30, 30d supply, fill #0

## 2024-01-23 MED ORDER — ATORVASTATIN CALCIUM 10 MG PO TABS: 20.0000 mg | ORAL_TABLET | Freq: Every day | ORAL | Status: DC

## 2024-01-23 MED ORDER — ATORVASTATIN CALCIUM 20 MG PO TABS
20.0000 mg | ORAL_TABLET | Freq: Every day | ORAL | 0 refills | Status: AC
  Filled 2024-01-23: qty 14, 14d supply, fill #0

## 2024-01-23 MED ORDER — MIRTAZAPINE 15 MG PO TABS
15.0000 mg | ORAL_TABLET | Freq: Every day | ORAL | 0 refills | Status: AC
  Filled 2024-01-23: qty 30, 30d supply, fill #0

## 2024-01-23 MED ORDER — NICOTINE 21 MG/24HR TD PT24: 21.0000 mg | MEDICATED_PATCH | Freq: Every day | TRANSDERMAL | Status: AC

## 2024-01-23 MED ORDER — HYDROXYZINE HCL 25 MG PO TABS
25.0000 mg | ORAL_TABLET | Freq: Three times a day (TID) | ORAL | 0 refills | Status: AC | PRN
  Filled 2024-01-23: qty 14, 5d supply, fill #0

## 2024-01-23 MED ORDER — OMEPRAZOLE 20 MG PO CPDR: 20.0000 mg | DELAYED_RELEASE_CAPSULE | Freq: Every morning | ORAL | Status: AC

## 2024-01-23 NOTE — Progress Notes (Signed)
(  Sleep Hours) - 8.5 (Any PRNs that were needed, meds refused, or side effects to meds)- hydroxzine  (Any disturbances and when (visitation, over night)- none (Concerns raised by the patient)- none  (SI/HI/AVH)- denies

## 2024-01-23 NOTE — Discharge Summary (Addendum)
 Physician Discharge Summary Note  Patient:  Katie Pope is an 43 y.o., female MRN:  [id number] DOB:  [birth year] Patient phone:  [phone] (home)  Patient address:   [address] [place] [address],  Total Time spent with patient: 30 minutes  Date of Admission:  01/18/2024 Date of Discharge: 01/23/24   Reason for Admission:  Katie Pope is a 43 year-old-female with a psychiatric history significant for bipolar I disorder, depression, and anxiety, admitted to the [facility] under involuntary commitment from [facility] ED. She presented to the ED with complaints of feeling "floppy," weak, and experiencing low energy throughout the day, shortly after starting ciprofloxacin  for a urinary tract infection. During evaluation, she reportedly disclosed to the ED physician that she had posted several threats of self-harm on Facebook the previous night and showed the posts on her phone. Based on safety concerns, she was placed under involuntary commitment. Urine drug screen was positive for cocaine and THC. Medical history is significant for bronchitis, dyslipidemia, scoliosis, and asthma.   Katie Pope is planned for discharge. She she is established with outpatient medication management services and has been referred for counseling services. She is aware of follow-up plans and demonstrates insight into the importance of ongoing treatment and abstaining from alcohol and psychoactive substances.  Katie Pope is future oriented and motivated toward recovery. No current safety concerns or acute psychiatric symptoms observed. No withdrawal symptoms or cravings reported.    Principal Problem: MDD (major depressive disorder), recurrent severe, without psychosis (HCC) Discharge Diagnoses: Principal Problem:   MDD (major depressive disorder), recurrent severe, without psychosis (HCC)   Past Psychiatric History: See H&P  Past Medical History:  Past Medical History:   Diagnosis Date   Anxiety    Asthma    Bipolar 1 disorder (HCC)    Bronchitis    Depression    Dyslipidemia    GERD (gastroesophageal reflux disease)    Scoliosis     Past Surgical History:  Procedure Laterality Date   CARPAL TUNNEL RELEASE Right 11/08/2022   Procedure: EXTENSILE CARPAL TUNNEL RELEASE;  Surgeon: Romona Harari, MD;  Location: [facility];  Service: Orthopedics;  Laterality: Right;  regional 60   FACIAL FRACTURE SURGERY Left    TENOSYNOVECTOMY Right 11/08/2022   Procedure: POSSIBLE TENOSYNOVECTOMY;  Surgeon: Romona Harari, MD;  Location:  SURGERY CENTER;  Service: Orthopedics;  Laterality: Right;   Family History:  Family History  Problem Relation Age of Onset   Cancer Paternal Grandfather    Dementia Paternal Grandmother    Diabetes Mother    Family Psychiatric  History: See H&P Social History:  Social History   Substance and Sexual Activity  Alcohol Use Not Currently   Comment: occ     Social History   Substance and Sexual Activity  Drug Use Yes   Types: Marijuana   Comment: last use 10/30/2022    Social History   Socioeconomic History   Marital status: Single    Spouse name: Not on file   Number of children: Not on file   Years of education: Not on file   Highest education level: Not on file  Occupational History   Not on file  Tobacco Use   Smoking status: Some Days    Current packs/day: 0.50    Average packs/day: 0.5 packs/day for 0.7 years (0.4 ttl pk-yrs)    Types: Cigarettes    Start date: 2025    Last attempt to quit: 12/23/2011  Smokeless tobacco: Former  Building services engineer status: Never Used  Substance and Sexual Activity   Alcohol use: Not Currently    Comment: occ   Drug use: Yes    Types: Marijuana    Comment: last use 10/30/2022   Sexual activity: Not Currently    Birth control/protection: I.U.D.  Other Topics Concern   Not on file  Social History Narrative   Lives with 50 year old  daughter and 44 year old son.     Social Drivers of Health   Financial Resource Strain: Not at Risk (06/15/2022)   Received from [employer] [facility]    Financial Resource Strain: 1  Food Insecurity: No Food Insecurity (01/18/2024)   Hunger Vital Sign    Worried About Running Out of Food in the Last Year: Never true    Ran Out of Food in the Last Year: Never true  Transportation Needs: No Transportation Needs (01/18/2024)   PRAPARE - Administrator, Civil Service (Medical): No    Lack of Transportation (Non-Medical): No  Physical Activity: Not on File (05/26/2022)   Received from [facility]   Physical Activity    Physical Activity: 0  Stress: Not on File (05/26/2022)   Received from [facility]   Stress    Stress: 0  Social Connections: Not on File (12/26/2022)   Received from [facility]   Social Connections    Connectedness: 0    Hospital Course:  During the patient's hospitalization, patient had extensive initial psychiatric evaluation, and follow-up psychiatric evaluations every day.  Psychiatric diagnoses provided upon initial assessment: MDD (major depressive disorder), recurrent severe, without psychosis (HCC)  Patient's psychiatric medications were adjusted on admission: Restarted Lamictal 25 mg nightly (reduced from her home dose of 50 mg due to recent noncompliance), for management of manic symptoms and mood instability.  She was also initiated on Remeron 15 mg nightly for appetite stimulation and insomnia.  The patient refused to restart her home medications (Zyprexa, Lexapro, and BuSpar), citing concern about taking too many medications.   During the hospitalization, no other adjustments were made to the patient's psychiatric medication regimen.   Patient's care was discussed during the interdisciplinary team meeting every day during the hospitalization.  The patient denies having side effects to prescribed psychiatric medication.  Gradually, patient started  adjusting to milieu. The patient was evaluated each day by a clinical provider to ascertain response to treatment. Improvement was noted by the patient's report of decreasing symptoms, improved sleep and appetite, affect, medication tolerance, behavior, and participation in unit programming.  Patient was asked each day to complete a self inventory noting mood, mental status, pain, new symptoms, anxiety and concerns.    Symptoms were reported as significantly decreased or resolved completely by discharge.   On day of discharge, the patient reports that their mood is stable. The patient denied having suicidal thoughts for more than 48 hours prior to discharge.  Patient denies having homicidal thoughts.  Patient denies having auditory hallucinations.  Patient denies any visual hallucinations or other symptoms of psychosis. The patient was motivated to continue taking medication with a goal of continued improvement in mental health.   The patient reports their target psychiatric symptoms of agitation, anxiety, and mania responded well to the psychiatric medications, and the patient reports overall benefit other psychiatric hospitalization. Supportive psychotherapy was provided to the patient. The patient also participated in regular group therapy while hospitalized. Coping skills, problem solving as well as relaxation  therapies were also part of the unit programming.  Labs were reviewed with the patient, and abnormal results were discussed with the patient.  The patient is able to verbalize their individual safety plan to this provider.  # It is recommended to the patient to continue psychiatric medications as prescribed, after discharge from the hospital.    # It is recommended to the patient to follow up with your outpatient psychiatric provider and PCP.  # It was discussed with the patient, the impact of alcohol, drugs, tobacco have been there overall psychiatric and medical wellbeing, and total  abstinence from substance use was recommended the patient.ed.  # Prescriptions provided or sent directly to preferred pharmacy at discharge. Patient agreeable to plan. Given opportunity to ask questions. Appears to feel comfortable with discharge.    # In the event of worsening symptoms, the patient is instructed to call the crisis hotline, 911 and or go to the nearest ED for appropriate evaluation and treatment of symptoms. To follow-up with primary care provider for other medical issues, concerns and or health care needs  # Patient was discharged home with a plan to follow up as noted below.   Physical Findings: AIMS:  , , 0 ,  ,  ,  ,   CIWA:   N/A COWS:   N/A  Musculoskeletal: Strength & Muscle Tone: within normal limits Gait & Station: normal Patient leans: N/A   Psychiatric Specialty Exam:  Presentation  General Appearance:  Casual; Neat  Eye Contact: Good  Speech: Clear and Coherent; Normal Rate  Speech Volume: Normal  Handedness: Right   Mood and Affect  Mood: Euthymic  Affect: Appropriate; Full Range   Thought Process  Thought Processes: Coherent; Linear  Descriptions of Associations:Intact  Orientation:Full (Time, Place and Person)  Thought Content:WDL  History of Schizophrenia/Schizoaffective disorder:No  Duration of Psychotic Symptoms:No data recorded Hallucinations:Hallucinations: None  Ideas of Reference:None  Suicidal Thoughts:Suicidal Thoughts: No  Homicidal Thoughts:Homicidal Thoughts: No   Sensorium  Memory: Immediate Good; Recent Good; Remote Good  Judgment: Intact  Insight: Present   Executive Functions  Concentration: Good  Attention Span: Good  Recall: Good  Fund of Knowledge: Good  Language: Good   Psychomotor Activity  Psychomotor Activity:Psychomotor Activity: Normal   Assets  Assets: Physical Health; Resilience; Social Support; Talents/Skills; Transportation   Sleep  Sleep:Sleep: Good   Estimated Sleeping Duration (Last 24 Hours): 7.00-7.50 hours   Physical Exam: Physical Exam Vitals and nursing note reviewed.  Constitutional:      General: She is not in acute distress.    Appearance: She is not ill-appearing.  HENT:     Mouth/Throat:     Pharynx: Oropharynx is clear.  Cardiovascular:     Rate and Rhythm: Normal rate.     Pulses: Normal pulses.  Pulmonary:     Effort: No respiratory distress.  Skin:    General: Skin is dry.  Neurological:     General: No focal deficit present.     Mental Status: She is alert and oriented to person, place, and time.  Psychiatric:        Mood and Affect: Mood normal.        Behavior: Behavior normal.        Thought Content: Thought content normal.        Judgment: Judgment normal.    Review of Systems  Constitutional: Negative.   HENT: Negative.    Respiratory: Negative.    Cardiovascular: Negative.   Gastrointestinal: Negative.  Genitourinary: Negative.   Skin: Negative.   Neurological: Negative.    Blood pressure 107/86, pulse 89, temperature 98.1 F (36.7 C), temperature source Oral, resp. rate 16, height 5' 5 (1.651 m), weight 64.1 kg, last menstrual period 01/14/2024, SpO2 99%, unknown if currently breastfeeding. Body mass index is 23.53 kg/m.   Social History   Tobacco Use  Smoking Status Some Days   Current packs/day: 0.50   Average packs/day: 0.5 packs/day for 0.7 years (0.4 ttl pk-yrs)   Types: Cigarettes   Start date: 2025   Last attempt to quit: 12/23/2011  Smokeless Tobacco Former   Tobacco Cessation:  A prescription for an FDA-approved tobacco cessation medication provided at discharge   Blood Alcohol level:  Lab Results  Component Value Date   [facility] <15 01/17/2024    Metabolic Disorder Labs:  Lab Results  Component Value Date   HGBA1C 5.2 01/22/2024   MPG 102.54 01/22/2024   No results found for: PROLACTIN Lab Results  Component Value Date   CHOL 218 (H) 01/22/2024   TRIG 207  (H) 01/22/2024   HDL 54 01/22/2024   CHOLHDL 4.0 01/22/2024   VLDL 41 (H) 01/22/2024   LDLCALC 123 (H) 01/22/2024   LDLCALC 69 03/14/2023    See Psychiatric Specialty Exam and Suicide Risk Assessment completed by Attending Physician prior to discharge.  Discharge destination:  Home  Is patient on multiple antipsychotic therapies at discharge:  No   Has Patient had three or more failed trials of antipsychotic monotherapy by history:  No  Recommended Plan for Multiple Antipsychotic Therapies: NA  Discharge Instructions     Activity as tolerated - No restrictions   Complete by: As directed    Diet - low sodium heart healthy   Complete by: As directed       Allergies as of 01/23/2024       Reactions   Other Rash   seafood   Rocephin [ceftriaxone Sodium In Dextrose ] Rash   Shellfish-derived Products Rash        Medication List     STOP taking these medications    albuterol  108 (90 Base) MCG/ACT inhaler Commonly known as: VENTOLIN  HFA   busPIRone 15 MG tablet Commonly known as: BUSPAR   ciprofloxacin  500 MG tablet Commonly known as: CIPRO    cyproheptadine 4 MG tablet Commonly known as: PERIACTIN   escitalopram 20 MG tablet Commonly known as: LEXAPRO   KlonoPIN 0.5 MG tablet Generic drug: clonazePAM   lidocaine  5 % Commonly known as: Lidoderm    OLANZapine 10 MG tablet Commonly known as: ZYPREXA       TAKE these medications      Indication  atorvastatin 20 MG tablet Commonly known as: LIPITOR Take 1 tablet (20 mg total) by mouth at bedtime.  Indication: Disease involving Lipid Deposits in the Arteries   hydrOXYzine  25 MG tablet Commonly known as: ATARAX  Take 1 tablet (25 mg total) by mouth 3 (three) times daily as needed for anxiety.  Indication: Feeling Anxious   lamoTRIgine 25 MG tablet Commonly known as: LAMICTAL Take 1 tablet (25 mg total) by mouth at bedtime. What changed: how much to take  Indication: Manic-Depression   mirtazapine  15 MG tablet Commonly known as: REMERON Take 1 tablet (15 mg total) by mouth at bedtime.  Indication: Appetite stimulation  Insomnia  Mood stability   nicotine  21 mg/24hr patch Commonly known as: NICODERM CQ  - dosed in mg/24 hours Place 1 patch (21 mg total) onto the skin daily at  6 (six) AM. Start taking on: January 24, 2024  Indication: Nicotine  Addiction   omeprazole 20 MG capsule Commonly known as: PRILOSEC Take 1 capsule (20 mg total) by mouth every morning.  Indication: GERD        Follow-up Information     [facility] Follow up on 02/04/2024.   Why: You have an appointment for medication management services on 02/04/24 at 3:40 pm, Virtual. Contact information: [address] [place] [address] [phone]         [facility] Go on 01/28/2024.   Specialty: Professional Counselor Why: Please go to this provider on 01/28/24 at 9:00 am for an assessment, if you wish to receive therapy services.  You  may also go Monday through Friday from 9 am to 1 pm. Contact information: [address] [place]  [address] [place] [address] [phone]                 Follow-up recommendations:   Activity: as tolerated  Diet: heart healthy  Other: -Follow-up with your outpatient psychiatric provider -instructions on appointment date, time, and address (location) are provided to you in discharge paperwork.  -Take your psychiatric medications as prescribed at discharge - instructions are provided to you in the discharge paperwork  -Follow-up with outpatient primary care doctor and other specialists -for management of preventative medicine and chronic medical disease: Dyslipidemia  UTI  -Testing: Follow-up with outpatient provider for abnormal lab results: Elevated cholesterol, triglycerides and LDL cholesterol.  -If you are prescribed an atypical antipsychotic medication, we recommend that your outpatient psychiatrist follow  routine screening for side effects within 3 months of discharge, including monitoring: AIMS scale, height, weight, blood pressure, fasting lipid panel, HbA1c, and fasting blood sugar.   -Recommend total abstinence from alcohol, tobacco, and other illicit drug use at discharge.   -If your psychiatric symptoms recur, worsen, or if you have side effects to your psychiatric medications, call your outpatient psychiatric provider, 911, [phone] or go to the nearest emergency department.  -If suicidal thoughts occur, immediately call your outpatient psychiatric provider, 911, [phone] or go to the nearest emergency department.   Signed: Blair Chiquita Hint, NP 01/23/2024, 3:12 PM

## 2024-01-23 NOTE — Progress Notes (Signed)
 Patient discharged off unit at 1115. Patient belongings reviewed and acknowledged by patient. AVS and Transition Record reviewed and acknowledged by patient. Safety plan completed by patient, reviewed by nurse with patient and copy provided. Any medications and or prescriptions necessary for discharge addressed and provided to patient. Patient denies SI, plan or intent. Denies HI. Denies AVH. No observed or reported side effects to medication. No observed or reported agitation, aggression, or other acute emotional distress. No reported or observed physical abnormalities or concerns. Patient transportation from facility verified and observed.

## 2024-01-23 NOTE — Progress Notes (Signed)
  [facility] Adult Case Management Discharge Plan :  Will you be returning to the same living situation after discharge:  Yes,  pt returning home at discharge At discharge, do you have transportation home?: Yes,  pt will be picked up by her friend around 11AM Do you have the ability to pay for your medications: Yes,  pt has active health insurance coverage  Release of information consent forms completed and in the chart;  Patient's signature needed at discharge.  Patient to Follow up at:  Follow-up Information     [facility] Follow up on 02/04/2024.   Why: You have an appointment for medication management services on 02/04/24 at 3:40 pm, Virtual. Contact information: [address] [place] [address] [phone]         [place] Go on 01/28/2024.   Specialty: Professional Counselor Why: Please go to this provider on 01/28/24 at 9:00 am for an assessment, if you wish to receive therapy services.  You  may also go Monday through Friday from 9 am to 1 pm. Contact information: [address] [place]  [address] [place] [address] [phone]                 Next level of care provider has access to [facility] Link:no  Safety Planning and Suicide Prevention discussed: Yes,  Ricardo Sar (friend) [phone]     Has patient been referred to the Quitline?: Patient refused referral for treatment  Patient has been referred for addiction treatment: No known substance use disorder.  Jenkins LULLA Primer, LCSWA 01/23/2024, 8:59 AM

## 2024-01-23 NOTE — [facility] Suicide Risk Assessment (Signed)
 Suicide Risk Assessment  Discharge Assessment    [facility] Discharge Suicide Risk Assessment   Principal Problem: MDD (major depressive disorder), recurrent severe, without psychosis (HCC) Discharge Diagnoses: Principal Problem:   MDD (major depressive disorder), recurrent severe, without psychosis (HCC)   Total Time spent with patient: 30 minutes  Musculoskeletal: Strength & Muscle Tone: within normal limits Gait & Station: normal Patient leans: N/A  Psychiatric Specialty Exam  Presentation  General Appearance:  Casual; Neat  Eye Contact: Good  Speech: Clear and Coherent; Normal Rate  Speech Volume: Normal  Handedness: Right   Mood and Affect  Mood: Euthymic  Duration of Depression Symptoms: Greater than two weeks  Affect: Appropriate; Full Range   Thought Process  Thought Processes: Coherent; Linear  Descriptions of Associations:Intact  Orientation:Full (Time, Place and Person)  Thought Content:WDL  History of Schizophrenia/Schizoaffective disorder:No  Duration of Psychotic Symptoms:No data recorded Hallucinations:Hallucinations: None  Ideas of Reference:None  Suicidal Thoughts:Suicidal Thoughts: No  Homicidal Thoughts:Homicidal Thoughts: No   Sensorium  Memory: Immediate Good; Recent Good; Remote Good  Judgment: Intact  Insight: Present   Executive Functions  Concentration: Good  Attention Span: Good  Recall: Good  Fund of Knowledge: Good  Language: Good   Psychomotor Activity  Psychomotor Activity: Psychomotor Activity: Normal   Assets  Assets: Physical Health; Resilience; Social Support; Talents/Skills; Transportation   Sleep  Sleep: Sleep: Good  Estimated Sleeping Duration (Last 24 Hours): 7.00-7.50 hours  Physical Exam: Physical Exam ROS Blood pressure 107/86, pulse 89, temperature 98.1 F (36.7 C), temperature source Oral, resp. rate 16, height 5' 5 (1.651 m), weight 64.1 kg, last menstrual period  01/14/2024, SpO2 99%, unknown if currently breastfeeding. Body mass index is 23.53 kg/m.  Mental Status Per Nursing Assessment::   On Admission:  Suicidal ideation indicated by others  Demographic Factors:  Caucasian and Low socioeconomic status  Loss Factors: Financial problems/change in socioeconomic status  Historical Factors: Impulsivity  Risk Reduction Factors:   Responsible for children under 7 years of age, Sense of responsibility to family, Employed, Positive social support, Positive therapeutic relationship, and Positive coping skills or problem solving skills  Continued Clinical Symptoms:  More than one psychiatric diagnosis Previous Psychiatric Diagnoses and Treatments Medical Diagnoses and Treatments/Surgeries  Cognitive Features That Contribute To Risk:  None    Suicide Risk:  Minimal: No identifiable suicidal ideation.  Patients presenting with no risk factors but with morbid ruminations; may be classified as minimal risk based on the severity of the depressive symptoms.  Patient is future oriented. No overt psychotic symptoms. No manic features. There is no evidence that she is a risk to herself or others at this time. She has been referred for outpatient medication management and therapy services. She is disinterested in substance use treatment. She identifies both her daughter and son as protective factors in her life. She is stable for a lower level of care.    Follow-up Information     [facility] Follow up on 02/04/2024.   Why: You have an appointment for medication management services on 02/04/24 at 3:40 pm, Virtual. Contact information: [address] [place] [address] [phone]         [place] Go on 01/28/2024.   Specialty: Professional Counselor Why: Please go to this provider on 01/28/24 at 9:00 am for an assessment, if you wish to receive therapy services.  You  may also go Monday through Friday  from 9 am to 1 pm. Contact  information: [address] [place]  [address] [place] [address] [phone]                 Plan Of Care/Follow-up recommendations:  See discharge summary.  Blair Chiquita Hint, NP 01/23/2024, 10:38 AM

## 2024-01-27 NOTE — Progress Notes (Signed)

## 2024-02-01 ENCOUNTER — Other Ambulatory Visit (HOSPITAL_COMMUNITY): Payer: Self-pay
